# Patient Record
Sex: Female | Born: 1950
Health system: Southern US, Community
[De-identification: ages and names within clinical notes are randomized; demographics above are authoritative.]

## PROBLEM LIST (undated history)

## (undated) DIAGNOSIS — K589 Irritable bowel syndrome without diarrhea: Secondary | ICD-10-CM

## (undated) DIAGNOSIS — Q839 Congenital malformation of breast, unspecified: Secondary | ICD-10-CM

## (undated) DIAGNOSIS — I2510 Atherosclerotic heart disease of native coronary artery without angina pectoris: Secondary | ICD-10-CM

## (undated) DIAGNOSIS — N3946 Mixed incontinence: Secondary | ICD-10-CM

## (undated) DIAGNOSIS — Z9889 Other specified postprocedural states: Secondary | ICD-10-CM

## (undated) DIAGNOSIS — R519 Headache, unspecified: Secondary | ICD-10-CM

## (undated) DIAGNOSIS — J309 Allergic rhinitis, unspecified: Secondary | ICD-10-CM

## (undated) DIAGNOSIS — N200 Calculus of kidney: Secondary | ICD-10-CM

## (undated) DIAGNOSIS — Z6828 Body mass index (BMI) 28.0-28.9, adult: Secondary | ICD-10-CM

## (undated) DIAGNOSIS — J449 Chronic obstructive pulmonary disease, unspecified: Secondary | ICD-10-CM

## (undated) DIAGNOSIS — R159 Full incontinence of feces: Secondary | ICD-10-CM

## (undated) DIAGNOSIS — E7800 Pure hypercholesterolemia, unspecified: Secondary | ICD-10-CM

## (undated) DIAGNOSIS — I700 Atherosclerosis of aorta: Secondary | ICD-10-CM

## (undated) DIAGNOSIS — K219 Gastro-esophageal reflux disease without esophagitis: Secondary | ICD-10-CM

## (undated) DIAGNOSIS — E559 Vitamin D deficiency, unspecified: Secondary | ICD-10-CM

## (undated) DIAGNOSIS — N951 Menopausal and female climacteric states: Secondary | ICD-10-CM

## (undated) DIAGNOSIS — Z79899 Other long term (current) drug therapy: Secondary | ICD-10-CM

## (undated) DIAGNOSIS — G479 Sleep disorder, unspecified: Secondary | ICD-10-CM

## (undated) DIAGNOSIS — R9389 Abnormal findings on diagnostic imaging of other specified body structures: Secondary | ICD-10-CM

## (undated) DIAGNOSIS — F17200 Nicotine dependence, unspecified, uncomplicated: Secondary | ICD-10-CM

## (undated) DIAGNOSIS — N3010 Interstitial cystitis (chronic) without hematuria: Secondary | ICD-10-CM

## (undated) DIAGNOSIS — R51 Headache: Secondary | ICD-10-CM

## (undated) DIAGNOSIS — Z860100 Personal history of colon polyps, unspecified: Secondary | ICD-10-CM

## (undated) DIAGNOSIS — F419 Anxiety disorder, unspecified: Secondary | ICD-10-CM

## (undated) DIAGNOSIS — F4321 Adjustment disorder with depressed mood: Secondary | ICD-10-CM

## (undated) DIAGNOSIS — M810 Age-related osteoporosis without current pathological fracture: Secondary | ICD-10-CM

## (undated) DIAGNOSIS — F321 Major depressive disorder, single episode, moderate: Secondary | ICD-10-CM

## (undated) DIAGNOSIS — R140 Abdominal distension (gaseous): Secondary | ICD-10-CM

## (undated) DIAGNOSIS — R112 Nausea with vomiting, unspecified: Secondary | ICD-10-CM

## (undated) DIAGNOSIS — F4320 Adjustment disorder, unspecified: Secondary | ICD-10-CM

## (undated) HISTORY — DX: Chronic obstructive pulmonary disease, unspecified: J44.9

## (undated) HISTORY — DX: Adjustment disorder, unspecified: F43.20

## (undated) HISTORY — DX: Vitamin D deficiency, unspecified: E55.9

## (undated) HISTORY — DX: Menopausal and female climacteric states: N95.1

## (undated) HISTORY — DX: Personal history of colonic polyps: Z86.010

## (undated) HISTORY — DX: Abdominal distension (gaseous): R14.0

## (undated) HISTORY — PX: BREAST EXCISIONAL BIOPSY: SUR124

## (undated) HISTORY — PX: TUBAL LIGATION: SHX77

## (undated) HISTORY — DX: Allergic rhinitis, unspecified: J30.9

## (undated) HISTORY — DX: Nicotine dependence, unspecified, uncomplicated: F17.200

## (undated) HISTORY — DX: Major depressive disorder, single episode, moderate: F32.1

## (undated) HISTORY — DX: Body mass index (BMI) 28.0-28.9, adult: Z68.28

## (undated) HISTORY — DX: Pure hypercholesterolemia, unspecified: E78.00

## (undated) HISTORY — DX: Calculus of kidney: N20.0

## (undated) HISTORY — DX: Atherosclerosis of aorta: I70.0

## (undated) HISTORY — DX: Abnormal findings on diagnostic imaging of other specified body structures: R93.89

## (undated) HISTORY — DX: Age-related osteoporosis without current pathological fracture: M81.0

## (undated) HISTORY — PX: TONSILLECTOMY: SUR1361

## (undated) HISTORY — DX: Personal history of colon polyps, unspecified: Z86.0100

## (undated) HISTORY — DX: Sleep disorder, unspecified: G47.9

## (undated) HISTORY — PX: DILATION AND CURETTAGE OF UTERUS: SHX78

## (undated) HISTORY — PX: CHOLECYSTECTOMY: SHX55

## (undated) HISTORY — DX: Full incontinence of feces: R15.9

## (undated) HISTORY — DX: Other long term (current) drug therapy: Z79.899

## (undated) HISTORY — DX: Adjustment disorder with depressed mood: F43.21

## (undated) HISTORY — DX: Gastro-esophageal reflux disease without esophagitis: K21.9

## (undated) HISTORY — PX: EYE SURGERY: SHX253

## (undated) HISTORY — DX: Anxiety disorder, unspecified: F41.9

## (undated) HISTORY — DX: Mixed incontinence: N39.46

## (undated) HISTORY — DX: Interstitial cystitis (chronic) without hematuria: N30.10

## (undated) HISTORY — DX: Congenital malformation of breast, unspecified: Q83.9

## (undated) HISTORY — DX: Irritable bowel syndrome, unspecified: K58.9

## (undated) HISTORY — PX: BREAST CYST ASPIRATION: SHX578

---

## 2000-06-22 ENCOUNTER — Encounter: Admission: RE | Admit: 2000-06-22 | Discharge: 2000-06-22 | Payer: Self-pay | Admitting: Family Medicine

## 2000-06-22 ENCOUNTER — Encounter: Payer: Self-pay | Admitting: Family Medicine

## 2001-11-18 ENCOUNTER — Encounter: Admission: RE | Admit: 2001-11-18 | Discharge: 2001-11-18 | Payer: Self-pay | Admitting: Family Medicine

## 2001-11-18 ENCOUNTER — Encounter: Payer: Self-pay | Admitting: Family Medicine

## 2002-01-18 ENCOUNTER — Encounter: Payer: Self-pay | Admitting: Family Medicine

## 2002-01-18 ENCOUNTER — Ambulatory Visit (HOSPITAL_COMMUNITY): Admission: RE | Admit: 2002-01-18 | Discharge: 2002-01-18 | Payer: Self-pay | Admitting: Family Medicine

## 2002-01-18 ENCOUNTER — Emergency Department (HOSPITAL_COMMUNITY): Admission: EM | Admit: 2002-01-18 | Discharge: 2002-01-19 | Payer: Self-pay | Admitting: Emergency Medicine

## 2002-01-19 ENCOUNTER — Encounter: Payer: Self-pay | Admitting: Emergency Medicine

## 2002-01-21 ENCOUNTER — Encounter: Payer: Self-pay | Admitting: General Surgery

## 2002-01-21 ENCOUNTER — Encounter: Admission: RE | Admit: 2002-01-21 | Discharge: 2002-01-21 | Payer: Self-pay | Admitting: General Surgery

## 2002-01-24 ENCOUNTER — Encounter: Payer: Self-pay | Admitting: Family Medicine

## 2002-01-24 ENCOUNTER — Ambulatory Visit (HOSPITAL_COMMUNITY): Admission: RE | Admit: 2002-01-24 | Discharge: 2002-01-24 | Payer: Self-pay | Admitting: Family Medicine

## 2002-02-07 ENCOUNTER — Encounter (INDEPENDENT_AMBULATORY_CARE_PROVIDER_SITE_OTHER): Payer: Self-pay | Admitting: Specialist

## 2002-02-07 ENCOUNTER — Observation Stay (HOSPITAL_COMMUNITY): Admission: RE | Admit: 2002-02-07 | Discharge: 2002-02-08 | Payer: Self-pay | Admitting: *Deleted

## 2003-04-22 ENCOUNTER — Encounter: Admission: RE | Admit: 2003-04-22 | Discharge: 2003-04-22 | Payer: Self-pay | Admitting: Family Medicine

## 2003-04-22 ENCOUNTER — Encounter: Payer: Self-pay | Admitting: Family Medicine

## 2003-09-29 ENCOUNTER — Encounter: Admission: RE | Admit: 2003-09-29 | Discharge: 2003-09-29 | Payer: Self-pay | Admitting: Family Medicine

## 2004-02-19 ENCOUNTER — Emergency Department (HOSPITAL_COMMUNITY): Admission: EM | Admit: 2004-02-19 | Discharge: 2004-02-19 | Payer: Self-pay | Admitting: Emergency Medicine

## 2004-09-16 ENCOUNTER — Ambulatory Visit (HOSPITAL_COMMUNITY): Admission: RE | Admit: 2004-09-16 | Discharge: 2004-09-16 | Payer: Self-pay | Admitting: Gastroenterology

## 2004-09-16 ENCOUNTER — Encounter (INDEPENDENT_AMBULATORY_CARE_PROVIDER_SITE_OTHER): Payer: Self-pay | Admitting: *Deleted

## 2005-01-23 ENCOUNTER — Encounter: Admission: RE | Admit: 2005-01-23 | Discharge: 2005-01-23 | Payer: Self-pay | Admitting: Family Medicine

## 2005-05-16 ENCOUNTER — Encounter: Admission: RE | Admit: 2005-05-16 | Discharge: 2005-05-16 | Payer: Self-pay | Admitting: Family Medicine

## 2005-06-27 ENCOUNTER — Other Ambulatory Visit: Admission: RE | Admit: 2005-06-27 | Discharge: 2005-06-27 | Payer: Self-pay | Admitting: Obstetrics and Gynecology

## 2006-03-19 ENCOUNTER — Emergency Department (HOSPITAL_COMMUNITY): Admission: EM | Admit: 2006-03-19 | Discharge: 2006-03-19 | Payer: Self-pay | Admitting: Emergency Medicine

## 2006-04-20 ENCOUNTER — Encounter: Admission: RE | Admit: 2006-04-20 | Discharge: 2006-04-20 | Payer: Self-pay | Admitting: Family Medicine

## 2007-09-16 ENCOUNTER — Encounter: Admission: RE | Admit: 2007-09-16 | Discharge: 2007-09-16 | Payer: Self-pay | Admitting: Family Medicine

## 2008-03-11 ENCOUNTER — Other Ambulatory Visit: Admission: RE | Admit: 2008-03-11 | Discharge: 2008-03-11 | Payer: Self-pay | Admitting: Family Medicine

## 2008-04-23 ENCOUNTER — Encounter: Admission: RE | Admit: 2008-04-23 | Discharge: 2008-04-23 | Payer: Self-pay | Admitting: Family Medicine

## 2008-06-11 ENCOUNTER — Ambulatory Visit: Payer: Self-pay | Admitting: Oncology

## 2008-06-19 LAB — CBC WITH DIFFERENTIAL/PLATELET
BASO%: 0.9 % (ref 0.0–2.0)
Basophils Absolute: 0.1 10*3/uL (ref 0.0–0.1)
EOS%: 1.5 % (ref 0.0–7.0)
Eosinophils Absolute: 0.2 10*3/uL (ref 0.0–0.5)
HCT: 41.6 % (ref 34.8–46.6)
HGB: 14.2 g/dL (ref 11.6–15.9)
LYMPH%: 34.8 % (ref 14.0–48.0)
MCH: 29.8 pg (ref 26.0–34.0)
MCHC: 34.2 g/dL (ref 32.0–36.0)
MCV: 87 fL (ref 81.0–101.0)
MONO#: 0.4 10*3/uL (ref 0.1–0.9)
MONO%: 4 % (ref 0.0–13.0)
NEUT#: 6.1 10*3/uL (ref 1.5–6.5)
NEUT%: 58.8 % (ref 39.6–76.8)
Platelets: 465 10*3/uL — ABNORMAL HIGH (ref 145–400)
RBC: 4.78 10*6/uL (ref 3.70–5.32)
RDW: 12.5 % (ref 11.3–14.5)
WBC: 10.4 10*3/uL — ABNORMAL HIGH (ref 3.9–10.0)
lymph#: 3.6 10*3/uL — ABNORMAL HIGH (ref 0.9–3.3)

## 2008-06-19 LAB — CHCC SMEAR

## 2008-06-24 LAB — COMPREHENSIVE METABOLIC PANEL
ALT: 21 U/L (ref 0–35)
AST: 22 U/L (ref 0–37)
Albumin: 4.4 g/dL (ref 3.5–5.2)
Alkaline Phosphatase: 78 U/L (ref 39–117)
BUN: 17 mg/dL (ref 6–23)
CO2: 22 mEq/L (ref 19–32)
Calcium: 9.5 mg/dL (ref 8.4–10.5)
Chloride: 104 mEq/L (ref 96–112)
Creatinine, Ser: 0.82 mg/dL (ref 0.40–1.20)
Glucose, Bld: 91 mg/dL (ref 70–99)
Potassium: 4.1 mEq/L (ref 3.5–5.3)
Sodium: 137 mEq/L (ref 135–145)
Total Bilirubin: 0.5 mg/dL (ref 0.3–1.2)
Total Protein: 7.2 g/dL (ref 6.0–8.3)

## 2008-06-24 LAB — LACTATE DEHYDROGENASE: LDH: 138 U/L (ref 94–250)

## 2008-06-24 LAB — JAK2 GENOTYPR

## 2008-06-24 LAB — C-REACTIVE PROTEIN: CRP: 0.1 mg/dL (ref <0.6)

## 2008-08-17 ENCOUNTER — Ambulatory Visit: Payer: Self-pay | Admitting: Oncology

## 2009-12-10 ENCOUNTER — Encounter: Admission: RE | Admit: 2009-12-10 | Discharge: 2009-12-10 | Payer: Self-pay | Admitting: Gastroenterology

## 2010-10-28 ENCOUNTER — Encounter
Admission: RE | Admit: 2010-10-28 | Discharge: 2010-10-28 | Payer: Self-pay | Source: Home / Self Care | Attending: Family Medicine | Admitting: Family Medicine

## 2010-11-07 ENCOUNTER — Encounter: Payer: Self-pay | Admitting: Family Medicine

## 2011-03-03 NOTE — Op Note (Signed)
[name], Audrey Hall NO.:  [id number]   MEDICAL RECORD NO.:  [id number]          PATIENT TYPE:  AMB   LOCATION:  ENDO                         FACILITY:  [facility]   PHYSICIAN:  Petra Kuba, M.D.    DATE OF BIRTH:  17-Nov-1950   DATE OF PROCEDURE:  09/16/2004  DATE OF DISCHARGE:                                 OPERATIVE REPORT   PROCEDURE:  Colonoscopy.   INDICATIONS:  Screening. Consent was signed after risks, benefits, methods,  and options thoroughly discussed in the office.   MEDICINES USED:  Demerol 80, Versed 8.   PROCEDURE NOTE:  Rectal inspection pertinent for external hemorrhoids.  Digital exam was negative. Pediatric video adjustable colonoscope was  inserted with lots of difficulty due to a very tortuous colon with rolling  on her back and rolling on her right side and back on her back. With various  abdominal pressures, we were able to advance to the cecum. No abnormalities  were seen on insertion. The cecum was identified by the appendiceal orifice  and ileocecal valve. Proctoscope was inserted short ways into the terminal  ileum which was normal. Photo documentation was obtained. The scope was  slowly withdrawn. The prep was adequate. There was some liquid stool that  required washing and suctioning. The scope was slowly withdrawn. In the more  proximal ascending, 2 questionable polyps were seen and were cold biopsied  multiple times and put in the first container. In the more distant  transverse, a small to tiny polyp was seen and was hot biopsied x1 and put  in a second container. Scope was slowly withdrawn. When we fell back around  the tortuous loop, we did try to readvance around the curves to decrease  chances of missing things. In the transverse colon, probably mid, another  tiny polyp was seen and was hot biopsied x1 and put in a third container.  Scope was withdrawn around the left side of the colon. Some distal sigmoid  and rectal  hyperplastic appearing polyps were seen and were hot biopsied and  put in a fourth container. Anorectal pull through and retroflexion confirmed  small hemorrhoids. The scope was straightened and re advanced short ways up  the left side of the colon. Air was suctioned. Scope removed. The patient  tolerated the procedure adequately. There were no obvious immediate  complications.   ENDOSCOPIC DIAGNOSES:  1.  Internal and external hemorrhoids.  2.  Very tortuous colon.  3.  Tiny rectal and sigmoid hyperplastic appearing polyps, hot biopsied.  4.  Tiny transverse and distal ascending polyps hot biopsied.  5.  Two proximal ascending questionable polyps, cold biopsied.  6.  Otherwise within normal limits to the cecum.   PLAN:  Await pathology to determine further colonic screening. If the  proximal ascending bottle #1 are adenomatous, they were not completely  removed, although I doubt that from the appearance. Continue workup with an  EGD. Might consider a virtual colonoscopy in the future when widely  available and repeat screening is needed.      MEM/MEDQ  D:  09/16/2004  T:  09/17/2004  Job:  [id number]   cc:   C. Duane Lope, M.D.  [address]  [place]  [place] [address]  Fax: [phone]

## 2011-03-03 NOTE — Op Note (Signed)
Audrey Hall, Audrey NO.:  [id number]   MEDICAL RECORD NO.:  [id number]          PATIENT TYPE:  AMB   LOCATION:  ENDO                         FACILITY:  [facility]   PHYSICIAN:  Petra Kuba, M.D.    DATE OF BIRTH:  1951/07/07   DATE OF PROCEDURE:  09/16/2004  DATE OF DISCHARGE:                                 OPERATIVE REPORT   PROCEDURE:  Esophagogastroduodenoscopy with biopsy.   INDICATIONS:  Upper tract symptoms. Consent was signed after risks,  benefits, methods, and options thoroughly discussed multiple times in the  past.   MEDICINES USED:  Demerol 20, Versed 2.   PROCEDURE NOTE:  The video endoscope was inserted by direct vision. The  esophagus was normal. She did have a small hiatal hernia. Scope passed into  the stomach and advanced through the antrum where some mild antritis was  seen, advanced through a normal pylorus into the duodenal bulb where some  mild bulbitis was seen. Scope was advanced around the C loop to a normal  second portion and probably third part of the duodenum. She seemed to have  some mild duodenitis throughout and a few scattered duodenal biopsies were  obtained but not of the bulb. Scope was withdrawn back to the stomach and  retroflexed. The fundus and angularis were normal as were the proximal  lesser and greater curve. In the cardia, the hiatal hernia was confirmed.  Straight visualization of the stomach confirmed the antritis, ruled out any  additional abnormalities. Two biopsies of the proximal stomach to rule out  helicobacter pylori. Air was suctioned. Scope was slowly withdrawn again. A  good look at the esophagus was normal. Scope was removed. The patient  tolerated the procedure well. There was no obvious or immediate  complication.   ENDOSCOPIC DIAGNOSES:  1.  Small hiatal hernia.  2.  Mild antritis status post biopsy.  3.  Mild duodenitis status post biopsy.  4.  Otherwise normal esophagogastroduodenoscopy.   PLAN:  Await pathology. Pump inhibitors p.r.n. Followup p.r.n. We will need  to review her upper GI to make sure no further workup plans are needed.      MEM/Audrey Hall  D:  09/16/2004  T:  09/17/2004  Job:  [id number]

## 2011-03-03 NOTE — Op Note (Signed)
[facility]  Patient:    Audrey Hall, APUZZO Visit Number: [id number] MRN: [id number]          Service Type: SUR Location: 4W [id number] 02 Attending Physician:  Vikki Ports Dictated by:   Earna Coder, M.D. Proc. Date: 02/07/02 Admit Date:  02/07/2002                             Operative Report  PREOPERATIVE DIAGNOSIS:  Chronic cholecystitis.  POSTOPERATIVE DIAGNOSIS:  Chronic cholecystitis.  OPERATION PERFORMED:  Laparoscopic cholecystectomy.  SURGEON:  Stephenie Acres, M.D.  ANESTHESIA:  General.  DESCRIPTION OF PROCEDURE:  The patient was taken to the operating room and placed in supine position.  After adequate anesthesia was induced using endotracheal tube, the abdomen was prepped and draped in normal sterile fashion.  Using a transverse infraumbilical incision, I dissected down to the fascia.  The fascia was opened vertically and a 0 Vicryl pursestring suture was placed around the fascial defect.  The Hasson trocar was placed in the abdomen and the abdomen was insufflated with a continuous flow of carbon dioxide to a pressure of 15 mHg.  Under direct visualization, a 10 mm port was placed in the subxiphoid region and two 5 mm ports were placed in the right abdomen.  The gallbladder was identified and retracted cephalad.  There were a number of filmy adhesions to the infundibulum and these were taken down using both blunt and sharp dissection.  The infundibulum was further dissected until the cystic duct could easily be identified as could a window posterior to it. The cystic duct was triply clipped and divided.  The cystic artery which was also well visualized with a good window behind it and the liver, was triply clipped and divided.  The gallbladder was then taken off the gallbladder bed by incising the mesentery using Bovie electrocautery and it was delivered out through the umbilical port.  The right upper quadrant was  then irrigated. Adequate hemostasis was ensured.  The abdomen was allowed to deflate.  All incisions were injected using 0.5% Marcaine.  The infraumbilical fascial defect was closed with the 0 Vicryl pursestring suture.  Skin incisions were closed with subcuticular 4-0 Monocryl.  Steri-Strips and sterile dressings were applied.  The patient tolerated the procedure well and went to PACU in good condition. Dictated by:   Earna Coder, M.D. Attending Physician:  Danna Hefty R. DD:  02/08/02 TD:  02/08/02 Job: [id number]

## 2011-03-03 NOTE — Consult Note (Signed)
NAME:  Audrey Hall, Audrey Hall                          ACCOUNT NO.:  [id number]   MEDICAL RECORD NO.:  [id number]                   PATIENT TYPE:  EMS   LOCATION:  MAJO                                 FACILITY:  [facility]   PHYSICIAN:  Hollice Espy, M.D.            DATE OF BIRTH:  28-Apr-1951   DATE OF CONSULTATION:  02/19/2004  DATE OF DISCHARGE:                                   CONSULTATION   The patient is a 60 year old white female with past medical history of  hyperlipidemia  and cholecystectomy who presents with chest pain.  She told  me she has had episodes of intermittent chest pain at the left breast.  She  was working today at [employer] when she had an episode.  The other  nurses and physicians became concerned and advised patient to go to the  emergency room.  The patient came to the emergency room and was noted to be  in normal sinus rhythm.  Her vital signs were stable with a blood pressure  of 144/87, heart rate 69, respirations 18, temperature 98.   EKG showed normal sinus rhythm with a very mild left atrial enlargement.  This is compared to a previous EKG and found to be completely normal.  Her  cardiac enzymes were checked.  Initial cardiac enzymes were drawn and found  CPK 54, MB less than 1, troponin I less than 0.05.   I advised patient that she should stay for a further evaluation overnight  for two more sets of cardiac enzymes to be checked, but the patient stated  that her chest pain resolved when she was able to calm down in the emergency  room.  She tells me she feels completely back to normal.  She describes her  chest pain as mid sternal to the left breast and with no radiation.  She  denies any shortness of breath, diaphoresis, nausea, or vomiting.  She  denies any other complaints as well.  She tells me that she will not stay,  and she tells me that even if she did, she would not be able to get a stress  test until Monday, but she does assure me that she  will plan on getting a  stress test done next week.  Hopefully it will be as soon as Monday.   The patient managed to be discharged to home.  She is advised to return to  the emergency room if she had any type of chest pain.  For this, she is  advised to take it easy for the next few days until her stress test and  otherwise continue her medications.  She is advised and she tells me she  will indeed do so.  Her husband is present and expresses understanding and  compliance with this as well.  li  Hollice Espy, M.D.    SKK/MEDQ  D:  02/19/2004  T:  02/21/2004  Job:  [id number]   cc:   [facility]

## 2011-06-08 ENCOUNTER — Other Ambulatory Visit: Payer: Self-pay | Admitting: Family Medicine

## 2011-06-08 DIAGNOSIS — N63 Unspecified lump in unspecified breast: Secondary | ICD-10-CM

## 2011-06-09 ENCOUNTER — Ambulatory Visit
Admission: RE | Admit: 2011-06-09 | Discharge: 2011-06-09 | Disposition: A | Discharge status: Home / Self Care | Payer: Private Health Insurance - Indemnity | Source: Ambulatory Visit | Attending: Family Medicine | Admitting: Family Medicine

## 2011-06-09 DIAGNOSIS — N63 Unspecified lump in unspecified breast: Secondary | ICD-10-CM

## 2011-06-29 ENCOUNTER — Other Ambulatory Visit: Payer: Self-pay | Admitting: Family Medicine

## 2011-06-29 ENCOUNTER — Other Ambulatory Visit (HOSPITAL_COMMUNITY)
Admission: RE | Admit: 2011-06-29 | Discharge: 2011-06-29 | Disposition: A | Discharge status: Home / Self Care | Payer: Private Health Insurance - Indemnity | Source: Ambulatory Visit | Attending: Family Medicine | Admitting: Family Medicine

## 2011-06-29 DIAGNOSIS — Z124 Encounter for screening for malignant neoplasm of cervix: Secondary | ICD-10-CM | POA: Insufficient documentation

## 2011-11-29 ENCOUNTER — Other Ambulatory Visit: Payer: Self-pay | Admitting: Family Medicine

## 2011-11-29 DIAGNOSIS — Z1231 Encounter for screening mammogram for malignant neoplasm of breast: Secondary | ICD-10-CM

## 2011-11-30 ENCOUNTER — Other Ambulatory Visit: Payer: Self-pay | Admitting: Family Medicine

## 2011-11-30 DIAGNOSIS — Z803 Family history of malignant neoplasm of breast: Secondary | ICD-10-CM

## 2011-12-05 ENCOUNTER — Ambulatory Visit
Admission: RE | Admit: 2011-12-05 | Discharge: 2011-12-05 | Disposition: A | Discharge status: Home / Self Care | Payer: Private Health Insurance - Indemnity | Source: Ambulatory Visit | Attending: Family Medicine | Admitting: Family Medicine

## 2011-12-05 DIAGNOSIS — Z1231 Encounter for screening mammogram for malignant neoplasm of breast: Secondary | ICD-10-CM

## 2011-12-07 ENCOUNTER — Ambulatory Visit
Admission: RE | Admit: 2011-12-07 | Discharge: 2011-12-07 | Disposition: A | Discharge status: Home / Self Care | Payer: Private Health Insurance - Indemnity | Source: Ambulatory Visit | Attending: Family Medicine | Admitting: Family Medicine

## 2011-12-07 DIAGNOSIS — Z803 Family history of malignant neoplasm of breast: Secondary | ICD-10-CM

## 2011-12-07 MED ORDER — GADOBENATE DIMEGLUMINE 529 MG/ML IV SOLN
10.0000 mL | Freq: Once | INTRAVENOUS | Status: AC | PRN
  Administered 2011-12-07: 10 mL via INTRAVENOUS

## 2013-01-23 ENCOUNTER — Other Ambulatory Visit: Payer: Self-pay

## 2013-01-23 DIAGNOSIS — Z1231 Encounter for screening mammogram for malignant neoplasm of breast: Secondary | ICD-10-CM

## 2013-02-17 ENCOUNTER — Ambulatory Visit
Admission: RE | Admit: 2013-02-17 | Discharge: 2013-02-17 | Disposition: A | Discharge status: Home / Self Care | Payer: Private Health Insurance - Indemnity | Source: Ambulatory Visit

## 2013-02-17 DIAGNOSIS — Z1231 Encounter for screening mammogram for malignant neoplasm of breast: Secondary | ICD-10-CM

## 2014-02-27 ENCOUNTER — Emergency Department (HOSPITAL_COMMUNITY)
Admission: EM | Admit: 2014-02-27 | Discharge: 2014-02-27 | Disposition: A | Discharge status: Home / Self Care | Payer: Federal, State, Local not specified - PPO | Attending: Emergency Medicine | Admitting: Emergency Medicine

## 2014-02-27 ENCOUNTER — Emergency Department (HOSPITAL_COMMUNITY): Payer: Federal, State, Local not specified - PPO

## 2014-02-27 ENCOUNTER — Encounter (HOSPITAL_COMMUNITY): Payer: Self-pay | Admitting: Emergency Medicine

## 2014-02-27 DIAGNOSIS — R030 Elevated blood-pressure reading, without diagnosis of hypertension: Secondary | ICD-10-CM | POA: Insufficient documentation

## 2014-02-27 DIAGNOSIS — F430 Acute stress reaction: Secondary | ICD-10-CM | POA: Insufficient documentation

## 2014-02-27 DIAGNOSIS — F411 Generalized anxiety disorder: Secondary | ICD-10-CM | POA: Insufficient documentation

## 2014-02-27 DIAGNOSIS — M549 Dorsalgia, unspecified: Secondary | ICD-10-CM | POA: Insufficient documentation

## 2014-02-27 DIAGNOSIS — F17200 Nicotine dependence, unspecified, uncomplicated: Secondary | ICD-10-CM | POA: Insufficient documentation

## 2014-02-27 DIAGNOSIS — F419 Anxiety disorder, unspecified: Secondary | ICD-10-CM

## 2014-02-27 DIAGNOSIS — R209 Unspecified disturbances of skin sensation: Secondary | ICD-10-CM | POA: Insufficient documentation

## 2014-02-27 DIAGNOSIS — E7800 Pure hypercholesterolemia, unspecified: Secondary | ICD-10-CM | POA: Insufficient documentation

## 2014-02-27 HISTORY — DX: Headache, unspecified: R51.9

## 2014-02-27 HISTORY — DX: Pure hypercholesterolemia, unspecified: E78.00

## 2014-02-27 HISTORY — DX: Headache: R51

## 2014-02-27 LAB — I-STAT CHEM 8, ED
BUN: 10 mg/dL (ref 6–23)
Calcium, Ion: 1.28 mmol/L (ref 1.13–1.30)
Chloride: 105 mEq/L (ref 96–112)
Creatinine, Ser: 0.8 mg/dL (ref 0.50–1.10)
GLUCOSE: 99 mg/dL (ref 70–99)
HEMATOCRIT: 43 % (ref 36.0–46.0)
HEMOGLOBIN: 14.6 g/dL (ref 12.0–15.0)
POTASSIUM: 3.8 meq/L (ref 3.7–5.3)
SODIUM: 144 meq/L (ref 137–147)
TCO2: 26 mmol/L (ref 0–100)

## 2014-02-27 MED ORDER — LORAZEPAM 1 MG PO TABS
1.0000 mg | ORAL_TABLET | Freq: Once | ORAL | Status: AC
  Administered 2014-02-27: 1 mg via ORAL
  Filled 2014-02-27: qty 1

## 2014-02-27 NOTE — ED Notes (Addendum)
Pt. Have a bilateral headache for 3 days with generalized weakness and mid upper back pain.  No neuro deficits.  Pt. Is alert and oriented X3. CBG 118 , Pain 5/10 120/80 HR 86,. Resp. 16 Pt. Denies any chest pain or numbness or tingling Denies any n/v, Pt. Reports having occipital headaches

## 2014-02-27 NOTE — Discharge Instructions (Signed)
Stress Stress-related medical problems are becoming increasingly common. The body has a built-in physical response to stressful situations. Faced with pressure, challenge or danger, we need to react quickly. Our bodies release hormones such as cortisol and adrenaline to help do this. These hormones are part of the "fight or flight" response and affect the metabolic rate, heart rate and blood pressure, resulting in a heightened, stressed state that prepares the body for optimum performance in dealing with a stressful situation. It is likely that early man required these mechanisms to stay alive, but usually modern stresses do not call for this, and the same hormones released in today's world can damage health and reduce coping ability. CAUSES  Pressure to perform at work, at school or in sports.  Threats of physical violence.  Money worries.  Arguments.  Family conflicts.  Divorce or separation from significant other.  Bereavement.  New job or unemployment.  Changes in location.  Alcohol or drug abuse. SOMETIMES, THERE IS NO PARTICULAR REASON FOR DEVELOPING STRESS. Almost all people are at risk of being stressed at some time in their lives. It is important to know that some stress is temporary and some is long term.  Temporary stress will go away when a situation is resolved. Most people can cope with short periods of stress, and it can often be relieved by relaxing, taking a walk, chatting through issues with friends, or having a good night's sleep.  Chronic (long-term, continuous) stress is much harder to deal with. It can be psychologically and emotionally damaging. It can be harmful both for an individual and for friends and family. SYMPTOMS Everyone reacts to stress differently. There are some common effects that help [place] recognize it. In times of extreme stress, people may:  Shake uncontrollably.  Breathe faster and deeper than normal (hyperventilate).  Vomit.  For people  with asthma, stress can trigger an attack.  For some people, stress may trigger migraine headaches, ulcers, and body pain. PHYSICAL EFFECTS OF STRESS MAY INCLUDE:  Loss of energy.  Skin problems.  Aches and pains resulting from tense muscles, including neck ache, backache and tension headaches.  Increased pain from arthritis and other conditions.  Irregular heart beat (palpitations).  Periods of irritability or anger.  Apathy or depression.  Anxiety (feeling uptight or worrying).  Unusual behavior.  Loss of appetite.  Comfort eating.  Lack of concentration.  Loss of, or decreased, sex-drive.  Increased smoking, drinking, or recreational drug use.  For women, missed periods.  Ulcers, joint pain, and muscle pain. Post-traumatic stress is the stress caused by any serious accident, strong emotional damage, or extremely difficult or violent experience such as rape or war. Post-traumatic stress victims can experience mixtures of emotions such as fear, shame, depression, guilt or anger. It may include recurrent memories or images that may be haunting. These feelings can last for weeks, months or even years after the traumatic event that triggered them. Specialized treatment, possibly with medicines and psychological therapies, is available. If stress is causing physical symptoms, severe distress or making it difficult for you to function as normal, it is worth seeing your caregiver. It is important to remember that although stress is a usual part of life, extreme or prolonged stress can lead to other illnesses that will need treatment. It is better to visit a doctor sooner rather than later. Stress has been linked to the development of high blood pressure and heart disease, as well as insomnia and depression. There is no diagnostic test for  stress since everyone reacts to it differently. But a caregiver will be able to spot the physical symptoms, such  as:  Headaches.  Shingles.  Ulcers. Emotional distress such as intense worry, low mood or irritability should be detected when the doctor asks pertinent questions to identify any underlying problems that might be the cause. In case there are physical reasons for the symptoms, the doctor may also want to do some tests to exclude certain conditions. If you feel that you are suffering from stress, try to identify the aspects of your life that are causing it. Sometimes you may not be able to change or avoid them, but even a small change can have a positive ripple effect. A simple lifestyle change can make all the difference. STRATEGIES THAT CAN HELP DEAL WITH STRESS:  Delegating or sharing responsibilities.  Avoiding confrontations.  Learning to be more assertive.  Regular exercise.  Avoid using alcohol or street drugs to cope.  Eating a healthy, balanced diet, rich in fruit and vegetables and proteins.  Finding humor or absurdity in stressful situations.  Never taking on more than you know you can handle comfortably.  Organizing your time better to get as much done as possible.  Talking to friends or family and sharing your thoughts and fears.  Listening to music or relaxation tapes.  Tensing and then relaxing your muscles, starting at the toes and working up to the head and neck. If you think that you would benefit from help, either in identifying the things that are causing your stress or in learning techniques to help you relax, see a caregiver who is capable of helping you with this. Rather than relying on medications, it is usually better to try and identify the things in your life that are causing stress and try to deal with them. There are many techniques of managing stress including counseling, psychotherapy, aromatherapy, yoga, and exercise. Your caregiver can help you determine what is best for you. Document Released: 12/23/2002 Document Revised: 12/25/2011 Document  Reviewed: 11/19/2007 [facility] LLCExitCare Patient Information 2014 [place], [place]. Generalized Anxiety Disorder Generalized anxiety disorder (GAD) is a mental disorder. It interferes with life functions, including relationships, work, and school. GAD is different from normal anxiety, which everyone experiences at some point in their lives in response to specific life events and activities. Normal anxiety actually helps us prepare for and get through these life events and activities. Normal anxiety goes away after the event or activity is over.  GAD causes anxiety that is not necessarily related to specific events or activities. It also causes excess anxiety in proportion to specific events or activities. The anxiety associated with GAD is also difficult to control. GAD can vary from mild to severe. People with severe GAD can have intense waves of anxiety with physical symptoms (panic attacks).  SYMPTOMS The anxiety and worry associated with GAD are difficult to control. This anxiety and worry are related to many life events and activities and also occur more days than not for 6 months or longer. People with GAD also have three or more of the following symptoms (one or more in children):  Restlessness.   Fatigue.  Difficulty concentrating.   Irritability.  Muscle tension.  Difficulty sleeping or unsatisfying sleep. DIAGNOSIS GAD is diagnosed through an assessment by your caregiver. Your caregiver will ask you questions aboutyour mood,physical symptoms, and events in your life. Your caregiver may ask you about your medical history and use of alcohol or drugs, including prescription medications. Your caregiver may  also do a physical exam and blood tests. Certain medical conditions and the use of certain substances can cause symptoms similar to those associated with GAD. Your caregiver may refer you to a mental health specialist for further evaluation. TREATMENT The following therapies are usually used  to treat GAD:   Medication Antidepressant medication usually is prescribed for long-term daily control. Antianxiety medications may be added in severe cases, especially when panic attacks occur.   Talk therapy (psychotherapy) Certain types of talk therapy can be helpful in treating GAD by providing support, education, and guidance. A form of talk therapy called cognitive behavioral therapy can teach you healthy ways to think about and react to daily life events and activities.  Stress managementtechniques These include yoga, meditation, and exercise and can be very helpful when they are practiced regularly. A mental health specialist can help determine which treatment is best for you. Some people see improvement with one therapy. However, other people require a combination of therapies. Document Released: 01/27/2013 Document Reviewed: 01/27/2013 [facility] LLCExitCare Patient Information 2014 [place]ExitCare, [place]LL[place].

## 2014-02-27 NOTE — ED Provider Notes (Addendum)
CSN: [id number]     Arrival date & time 02/27/14  1234 History   First MD Initiated Contact with Patient 02/27/14 1259     Chief Complaint  Patient presents with  . Headache     (Consider location/radiation/quality/duration/timing/severity/associated sxs/prior Treatment) HPI  63 year old female who comes in today complaining of some headache, back pain, anxiety, emotional stress. She states that she has been having increased stressors at home and at work. She describes the headache as mild and intermittent. It has essentially resolved now. She had some pain in her upper back without any definite trauma. She then was feeling like she had tingling all over. She was at work and asked a Radio broadcast assistantcoworker to check her out. Her blood pressure was noted to be elevated and they called EMS and subsequently she was brought here. The back pain and headaches have essentially resolved spontaneously. She did not have any vision changes or lateralized weakness.  Past Medical History  Diagnosis Date  . Headache   . Hypercholesteremia    Past Surgical History  Procedure Laterality Date  . Cholecystectomy     No family history on file. History  Substance Use Topics  . Smoking status: Current Every Day Smoker    Types: Cigarettes  . Smokeless tobacco: Not on file  . Alcohol Use: No   OB History   Grav Para Term Preterm Abortions TAB SAB Ect Mult Living                 Review of Systems  All other systems reviewed and are negative.     Allergies  Review of patient's allergies indicates no known allergies.  Home Medications   Prior to Admission medications   Not on File   BP 117/70  Pulse 76  Temp(Src) 98.2 F (36.8 C) (Oral)  Resp 16  SpO2 99% Physical Exam  Nursing note and vitals reviewed. Constitutional: She is oriented to person, place, and time. She appears well-developed and well-nourished.  HENT:  Head: Normocephalic and atraumatic.  Right Ear: External ear normal.  Left Ear:  External ear normal.  Nose: Nose normal.  Mouth/Throat: Oropharynx is clear and moist.  Eyes: Conjunctivae and EOM are normal. Pupils are equal, round, and reactive to light.  Neck: Normal range of motion. Neck supple. No JVD present. No tracheal deviation present. No thyromegaly present.  Cardiovascular: Normal rate, regular rhythm, normal heart sounds and intact distal pulses.   Pulmonary/Chest: Effort normal and breath sounds normal. No respiratory distress. She has no wheezes.  Abdominal: Soft. Bowel sounds are normal. She exhibits no mass. There is no tenderness. There is no guarding.  Musculoskeletal: Normal range of motion.  Lymphadenopathy:    She has no cervical adenopathy.  Neurological: She is alert and oriented to person, place, and time. She has normal reflexes. No cranial nerve deficit or sensory deficit. Gait normal. GCS eye subscore is 4. GCS verbal subscore is 5. GCS motor subscore is 6.  Reflex Scores:      Bicep reflexes are 2+ on the right side and 2+ on the left side.      Patellar reflexes are 2+ on the right side and 2+ on the left side. Strength is 5/5 bilateral elbow flexor/extensors, wrist extension/flexion, intrinsic hand strength equal Bilateral hip flexion/extension 5/5, knee flexion/extension 5/5, ankle 5/5 flexion extension    Skin: Skin is warm and dry.  Psychiatric: Her speech is normal and behavior is normal. Judgment and thought content normal. Her mood appears anxious. She  expresses no suicidal plans and no homicidal plans.  Patient is somewhat tearful    ED Course  Procedures (including critical care time) Labs Review Labs Reviewed  I-STAT CHEM 8, ED    Imaging Review Dg Chest 2 View  02/27/2014   CLINICAL DATA:  Chest tightness  EXAM: CHEST  2 VIEW  COMPARISON:  January 07, 2014  FINDINGS: The heart size and mediastinal contours are within normal limits. There is no focal infiltrate, pulmonary edema, or pleural effusion. The visualized skeletal  structures are unremarkable. There is scoliosis of spine.  IMPRESSION: No active cardiopulmonary disease.   Electronically Signed   By: Sherian ReinWei-Chen  Lin M.D.   On: 02/27/2014 13:49     EKG Interpretation   Date/Time:  Friday Feb 27 2014 13:54:54 EDT Ventricular Rate:  71 PR Interval:  167 QRS Duration: 77 QT Interval:  390 QTC Calculation: 424 R Axis:   70 Text Interpretation:  Normal sinus rhythm Normal ECG Confirmed by Yoshiaki Kreuser MD,  Duwayne HeckANIELLE ([id number]) on 02/27/2014 2:23:28 PM      MDM   Final diagnoses:  Anxiety   63 year old female who comes in today complaining of diffuse paresthesias and anxiety. Electrolytes and EKG are normal. She had some back pain and hence the EKG was checked. She feels that this is likely mostly secondary to anxiety and historically this appears to be worn out. She does not appear in any acute metabolic abnormalities and EKG is normal. She is given Ativan here and feels improved. I've advised her of return precautions and need for close followup and she was understanding.      Hilario Quarryanielle S Minetta Krisher, MD 02/27/14 1427  Hilario Quarryanielle S Mallie Linnemann, MD 02/27/14 [phone]

## 2014-04-02 ENCOUNTER — Other Ambulatory Visit: Payer: Self-pay | Admitting: Family Medicine

## 2014-04-02 ENCOUNTER — Other Ambulatory Visit (HOSPITAL_COMMUNITY)
Admission: RE | Admit: 2014-04-02 | Discharge: 2014-04-02 | Disposition: A | Discharge status: Home / Self Care | Payer: Federal, State, Local not specified - PPO | Source: Ambulatory Visit | Attending: Family Medicine | Admitting: Family Medicine

## 2014-04-02 DIAGNOSIS — Z124 Encounter for screening for malignant neoplasm of cervix: Secondary | ICD-10-CM | POA: Insufficient documentation

## 2014-04-06 LAB — CYTOLOGY - PAP

## 2014-07-08 ENCOUNTER — Other Ambulatory Visit: Payer: Self-pay | Admitting: Family Medicine

## 2014-07-08 ENCOUNTER — Other Ambulatory Visit: Payer: Self-pay

## 2014-07-08 DIAGNOSIS — Z1231 Encounter for screening mammogram for malignant neoplasm of breast: Secondary | ICD-10-CM

## 2014-07-27 ENCOUNTER — Ambulatory Visit: Payer: Private Health Insurance - Indemnity

## 2014-07-27 ENCOUNTER — Ambulatory Visit
Admission: RE | Admit: 2014-07-27 | Discharge: 2014-07-27 | Disposition: A | Discharge status: Home / Self Care | Payer: Federal, State, Local not specified - PPO | Source: Ambulatory Visit

## 2014-07-27 DIAGNOSIS — Z1231 Encounter for screening mammogram for malignant neoplasm of breast: Secondary | ICD-10-CM

## 2015-07-09 ENCOUNTER — Other Ambulatory Visit: Payer: Self-pay

## 2015-07-09 DIAGNOSIS — Z1231 Encounter for screening mammogram for malignant neoplasm of breast: Secondary | ICD-10-CM

## 2015-08-06 ENCOUNTER — Ambulatory Visit
Admission: RE | Admit: 2015-08-06 | Discharge: 2015-08-06 | Disposition: A | Discharge status: Home / Self Care | Payer: Federal, State, Local not specified - PPO | Source: Ambulatory Visit

## 2015-08-06 DIAGNOSIS — Z1231 Encounter for screening mammogram for malignant neoplasm of breast: Secondary | ICD-10-CM

## 2015-08-27 ENCOUNTER — Other Ambulatory Visit: Payer: Self-pay | Admitting: Gastroenterology

## 2016-04-05 DIAGNOSIS — F411 Generalized anxiety disorder: Secondary | ICD-10-CM | POA: Diagnosis not present

## 2016-04-05 DIAGNOSIS — J449 Chronic obstructive pulmonary disease, unspecified: Secondary | ICD-10-CM | POA: Diagnosis not present

## 2016-04-05 DIAGNOSIS — R42 Dizziness and giddiness: Secondary | ICD-10-CM | POA: Diagnosis not present

## 2016-04-05 DIAGNOSIS — N951 Menopausal and female climacteric states: Secondary | ICD-10-CM | POA: Diagnosis not present

## 2016-07-12 ENCOUNTER — Other Ambulatory Visit: Payer: Self-pay | Admitting: Family Medicine

## 2016-07-12 DIAGNOSIS — Z1231 Encounter for screening mammogram for malignant neoplasm of breast: Secondary | ICD-10-CM

## 2016-08-09 ENCOUNTER — Ambulatory Visit
Admission: RE | Admit: 2016-08-09 | Discharge: 2016-08-09 | Disposition: A | Discharge status: Home / Self Care | Payer: Federal, State, Local not specified - PPO | Source: Ambulatory Visit | Attending: Family Medicine | Admitting: Family Medicine

## 2016-08-09 DIAGNOSIS — Z1231 Encounter for screening mammogram for malignant neoplasm of breast: Secondary | ICD-10-CM | POA: Diagnosis not present

## 2016-09-13 DIAGNOSIS — H43811 Vitreous degeneration, right eye: Secondary | ICD-10-CM | POA: Diagnosis not present

## 2016-10-11 DIAGNOSIS — H43811 Vitreous degeneration, right eye: Secondary | ICD-10-CM | POA: Diagnosis not present

## 2016-10-18 DIAGNOSIS — H43311 Vitreous membranes and strands, right eye: Secondary | ICD-10-CM | POA: Diagnosis not present

## 2016-10-18 DIAGNOSIS — H43811 Vitreous degeneration, right eye: Secondary | ICD-10-CM | POA: Diagnosis not present

## 2016-10-25 DIAGNOSIS — H43811 Vitreous degeneration, right eye: Secondary | ICD-10-CM | POA: Diagnosis not present

## 2016-10-25 DIAGNOSIS — H43311 Vitreous membranes and strands, right eye: Secondary | ICD-10-CM | POA: Diagnosis not present

## 2016-11-08 DIAGNOSIS — F411 Generalized anxiety disorder: Secondary | ICD-10-CM | POA: Diagnosis not present

## 2016-11-08 DIAGNOSIS — L309 Dermatitis, unspecified: Secondary | ICD-10-CM | POA: Diagnosis not present

## 2016-11-08 DIAGNOSIS — R079 Chest pain, unspecified: Secondary | ICD-10-CM | POA: Diagnosis not present

## 2016-11-08 DIAGNOSIS — Z0000 Encounter for general adult medical examination without abnormal findings: Secondary | ICD-10-CM | POA: Diagnosis not present

## 2016-11-08 DIAGNOSIS — E7800 Pure hypercholesterolemia, unspecified: Secondary | ICD-10-CM | POA: Diagnosis not present

## 2016-12-06 DIAGNOSIS — H43811 Vitreous degeneration, right eye: Secondary | ICD-10-CM | POA: Diagnosis not present

## 2016-12-06 DIAGNOSIS — H2513 Age-related nuclear cataract, bilateral: Secondary | ICD-10-CM | POA: Diagnosis not present

## 2016-12-06 DIAGNOSIS — H43311 Vitreous membranes and strands, right eye: Secondary | ICD-10-CM | POA: Diagnosis not present

## 2017-04-11 DIAGNOSIS — F411 Generalized anxiety disorder: Secondary | ICD-10-CM | POA: Diagnosis not present

## 2017-04-11 DIAGNOSIS — J449 Chronic obstructive pulmonary disease, unspecified: Secondary | ICD-10-CM | POA: Diagnosis not present

## 2017-06-06 DIAGNOSIS — H2513 Age-related nuclear cataract, bilateral: Secondary | ICD-10-CM | POA: Diagnosis not present

## 2017-06-06 DIAGNOSIS — H43311 Vitreous membranes and strands, right eye: Secondary | ICD-10-CM | POA: Diagnosis not present

## 2017-06-06 DIAGNOSIS — H43811 Vitreous degeneration, right eye: Secondary | ICD-10-CM | POA: Diagnosis not present

## 2017-06-08 DIAGNOSIS — H6982 Other specified disorders of Eustachian tube, left ear: Secondary | ICD-10-CM | POA: Diagnosis not present

## 2017-06-08 DIAGNOSIS — H6092 Unspecified otitis externa, left ear: Secondary | ICD-10-CM | POA: Diagnosis not present

## 2017-07-25 DIAGNOSIS — D225 Melanocytic nevi of trunk: Secondary | ICD-10-CM | POA: Diagnosis not present

## 2017-07-25 DIAGNOSIS — L821 Other seborrheic keratosis: Secondary | ICD-10-CM | POA: Diagnosis not present

## 2017-08-01 DIAGNOSIS — R42 Dizziness and giddiness: Secondary | ICD-10-CM | POA: Diagnosis not present

## 2017-08-02 ENCOUNTER — Other Ambulatory Visit: Payer: Self-pay | Admitting: Family Medicine

## 2017-08-02 DIAGNOSIS — Z1231 Encounter for screening mammogram for malignant neoplasm of breast: Secondary | ICD-10-CM

## 2017-08-22 DIAGNOSIS — Z23 Encounter for immunization: Secondary | ICD-10-CM | POA: Diagnosis not present

## 2017-08-22 DIAGNOSIS — Q839 Congenital malformation of breast, unspecified: Secondary | ICD-10-CM | POA: Diagnosis not present

## 2017-08-27 ENCOUNTER — Other Ambulatory Visit: Payer: Self-pay | Admitting: Obstetrics and Gynecology

## 2017-08-27 ENCOUNTER — Other Ambulatory Visit: Payer: Self-pay | Admitting: Family Medicine

## 2017-08-27 DIAGNOSIS — Q839 Congenital malformation of breast, unspecified: Secondary | ICD-10-CM

## 2017-09-05 ENCOUNTER — Ambulatory Visit
Admission: RE | Admit: 2017-09-05 | Discharge: 2017-09-05 | Disposition: A | Discharge status: Home / Self Care | Payer: Federal, State, Local not specified - PPO | Source: Ambulatory Visit | Attending: Family Medicine | Admitting: Family Medicine

## 2017-09-05 DIAGNOSIS — N6321 Unspecified lump in the left breast, upper outer quadrant: Secondary | ICD-10-CM | POA: Diagnosis not present

## 2017-09-05 DIAGNOSIS — Q839 Congenital malformation of breast, unspecified: Secondary | ICD-10-CM

## 2017-09-05 DIAGNOSIS — R922 Inconclusive mammogram: Secondary | ICD-10-CM | POA: Diagnosis not present

## 2017-09-12 DIAGNOSIS — J449 Chronic obstructive pulmonary disease, unspecified: Secondary | ICD-10-CM | POA: Diagnosis not present

## 2017-09-12 DIAGNOSIS — H8110 Benign paroxysmal vertigo, unspecified ear: Secondary | ICD-10-CM

## 2017-09-12 DIAGNOSIS — H903 Sensorineural hearing loss, bilateral: Secondary | ICD-10-CM

## 2017-09-12 DIAGNOSIS — R42 Dizziness and giddiness: Secondary | ICD-10-CM

## 2017-09-12 DIAGNOSIS — H9313 Tinnitus, bilateral: Secondary | ICD-10-CM

## 2017-09-12 DIAGNOSIS — Z0110 Encounter for examination of ears and hearing without abnormal findings: Secondary | ICD-10-CM | POA: Diagnosis not present

## 2017-09-12 HISTORY — DX: Dizziness and giddiness: R42

## 2017-09-12 HISTORY — DX: Sensorineural hearing loss, bilateral: H90.3

## 2017-09-12 HISTORY — DX: Benign paroxysmal vertigo, unspecified ear: H81.10

## 2017-09-12 HISTORY — DX: Tinnitus, bilateral: H93.13

## 2017-11-14 ENCOUNTER — Other Ambulatory Visit (HOSPITAL_COMMUNITY)
Admission: RE | Admit: 2017-11-14 | Discharge: 2017-11-14 | Disposition: A | Discharge status: Home / Self Care | Payer: Federal, State, Local not specified - PPO | Source: Ambulatory Visit | Attending: Family Medicine | Admitting: Family Medicine

## 2017-11-14 ENCOUNTER — Other Ambulatory Visit: Payer: Self-pay | Admitting: Family Medicine

## 2017-11-14 DIAGNOSIS — J449 Chronic obstructive pulmonary disease, unspecified: Secondary | ICD-10-CM | POA: Diagnosis not present

## 2017-11-14 DIAGNOSIS — N951 Menopausal and female climacteric states: Secondary | ICD-10-CM | POA: Diagnosis not present

## 2017-11-14 DIAGNOSIS — Z124 Encounter for screening for malignant neoplasm of cervix: Secondary | ICD-10-CM | POA: Diagnosis not present

## 2017-11-14 DIAGNOSIS — F411 Generalized anxiety disorder: Secondary | ICD-10-CM | POA: Diagnosis not present

## 2017-11-14 DIAGNOSIS — Z0000 Encounter for general adult medical examination without abnormal findings: Secondary | ICD-10-CM | POA: Diagnosis not present

## 2017-11-14 DIAGNOSIS — E559 Vitamin D deficiency, unspecified: Secondary | ICD-10-CM | POA: Diagnosis not present

## 2017-11-14 DIAGNOSIS — K219 Gastro-esophageal reflux disease without esophagitis: Secondary | ICD-10-CM | POA: Diagnosis not present

## 2017-11-14 DIAGNOSIS — E7800 Pure hypercholesterolemia, unspecified: Secondary | ICD-10-CM | POA: Diagnosis not present

## 2017-11-15 LAB — CYTOLOGY - PAP: Diagnosis: NEGATIVE

## 2018-05-08 DIAGNOSIS — F411 Generalized anxiety disorder: Secondary | ICD-10-CM | POA: Diagnosis not present

## 2018-05-08 DIAGNOSIS — G479 Sleep disorder, unspecified: Secondary | ICD-10-CM | POA: Diagnosis not present

## 2018-07-31 DIAGNOSIS — Z23 Encounter for immunization: Secondary | ICD-10-CM | POA: Diagnosis not present

## 2018-10-26 DIAGNOSIS — K120 Recurrent oral aphthae: Secondary | ICD-10-CM | POA: Diagnosis not present

## 2018-10-30 ENCOUNTER — Other Ambulatory Visit: Payer: Self-pay | Admitting: Gastroenterology

## 2018-10-30 ENCOUNTER — Ambulatory Visit
Admission: RE | Admit: 2018-10-30 | Discharge: 2018-10-30 | Disposition: A | Discharge status: Home / Self Care | Payer: Federal, State, Local not specified - PPO | Source: Ambulatory Visit | Attending: Gastroenterology | Admitting: Gastroenterology

## 2018-10-30 DIAGNOSIS — K589 Irritable bowel syndrome without diarrhea: Secondary | ICD-10-CM | POA: Diagnosis not present

## 2018-10-30 DIAGNOSIS — R159 Full incontinence of feces: Secondary | ICD-10-CM

## 2018-10-30 DIAGNOSIS — R140 Abdominal distension (gaseous): Secondary | ICD-10-CM | POA: Diagnosis not present

## 2018-12-04 DIAGNOSIS — H43812 Vitreous degeneration, left eye: Secondary | ICD-10-CM | POA: Diagnosis not present

## 2018-12-11 DIAGNOSIS — K589 Irritable bowel syndrome without diarrhea: Secondary | ICD-10-CM | POA: Diagnosis not present

## 2018-12-18 DIAGNOSIS — Z79899 Other long term (current) drug therapy: Secondary | ICD-10-CM | POA: Diagnosis not present

## 2018-12-18 DIAGNOSIS — E7800 Pure hypercholesterolemia, unspecified: Secondary | ICD-10-CM | POA: Diagnosis not present

## 2018-12-18 DIAGNOSIS — E559 Vitamin D deficiency, unspecified: Secondary | ICD-10-CM | POA: Diagnosis not present

## 2018-12-25 DIAGNOSIS — K219 Gastro-esophageal reflux disease without esophagitis: Secondary | ICD-10-CM | POA: Diagnosis not present

## 2018-12-25 DIAGNOSIS — Z0000 Encounter for general adult medical examination without abnormal findings: Secondary | ICD-10-CM | POA: Diagnosis not present

## 2018-12-25 DIAGNOSIS — Z23 Encounter for immunization: Secondary | ICD-10-CM | POA: Diagnosis not present

## 2018-12-25 DIAGNOSIS — F411 Generalized anxiety disorder: Secondary | ICD-10-CM | POA: Diagnosis not present

## 2018-12-25 DIAGNOSIS — M810 Age-related osteoporosis without current pathological fracture: Secondary | ICD-10-CM | POA: Diagnosis not present

## 2018-12-25 DIAGNOSIS — J449 Chronic obstructive pulmonary disease, unspecified: Secondary | ICD-10-CM | POA: Diagnosis not present

## 2019-01-01 DIAGNOSIS — H43392 Other vitreous opacities, left eye: Secondary | ICD-10-CM | POA: Diagnosis not present

## 2019-01-01 DIAGNOSIS — H35372 Puckering of macula, left eye: Secondary | ICD-10-CM | POA: Diagnosis not present

## 2019-01-01 DIAGNOSIS — H43812 Vitreous degeneration, left eye: Secondary | ICD-10-CM | POA: Diagnosis not present

## 2019-01-01 DIAGNOSIS — H4312 Vitreous hemorrhage, left eye: Secondary | ICD-10-CM | POA: Diagnosis not present

## 2019-01-01 DIAGNOSIS — H318 Other specified disorders of choroid: Secondary | ICD-10-CM | POA: Diagnosis not present

## 2019-04-29 ENCOUNTER — Other Ambulatory Visit: Payer: Self-pay | Admitting: Family Medicine

## 2019-04-29 DIAGNOSIS — Z1231 Encounter for screening mammogram for malignant neoplasm of breast: Secondary | ICD-10-CM

## 2019-04-30 ENCOUNTER — Other Ambulatory Visit: Payer: Self-pay | Admitting: Family Medicine

## 2019-04-30 DIAGNOSIS — N644 Mastodynia: Secondary | ICD-10-CM

## 2019-05-07 ENCOUNTER — Ambulatory Visit
Admission: RE | Admit: 2019-05-07 | Discharge: 2019-05-07 | Disposition: A | Discharge status: Home / Self Care | Payer: Federal, State, Local not specified - PPO | Source: Ambulatory Visit | Attending: Family Medicine | Admitting: Family Medicine

## 2019-05-07 ENCOUNTER — Other Ambulatory Visit: Payer: Self-pay

## 2019-05-07 ENCOUNTER — Ambulatory Visit: Payer: Federal, State, Local not specified - PPO

## 2019-05-07 DIAGNOSIS — R928 Other abnormal and inconclusive findings on diagnostic imaging of breast: Secondary | ICD-10-CM | POA: Diagnosis not present

## 2019-05-07 DIAGNOSIS — N644 Mastodynia: Secondary | ICD-10-CM

## 2019-07-02 DIAGNOSIS — J449 Chronic obstructive pulmonary disease, unspecified: Secondary | ICD-10-CM | POA: Diagnosis not present

## 2019-07-02 DIAGNOSIS — F411 Generalized anxiety disorder: Secondary | ICD-10-CM | POA: Diagnosis not present

## 2019-11-09 ENCOUNTER — Ambulatory Visit: Payer: Federal, State, Local not specified - PPO | Attending: Internal Medicine

## 2019-11-09 DIAGNOSIS — Z23 Encounter for immunization: Secondary | ICD-10-CM | POA: Insufficient documentation

## 2019-11-09 NOTE — Progress Notes (Signed)
   Covid-19 Vaccination Clinic  Name:  Audrey Hall    MRN: [id number] DOB: 1950-11-07  11/09/2019  Ms. [name] was observed post Covid-19 immunization for 15 minutes without incidence. She was provided with Vaccine Information Sheet and instruction to access the V-Safe system.   Ms. Walls was instructed to call 911 with any severe reactions post vaccine: Marland Kitchen Difficulty breathing  . Swelling of your face and throat  . A fast heartbeat  . A bad rash all over your body  . Dizziness and weakness    Immunizations Administered    Name Date Dose VIS Date Route   Pfizer COVID-19 Vaccine 11/09/2019 12:47 PM 0.3 mL 09/26/2019 Intramuscular   Manufacturer: [employer], [employer]   Lot: [id number]   NDC: [address]-8

## 2019-12-01 ENCOUNTER — Ambulatory Visit: Payer: Federal, State, Local not specified - PPO | Attending: Internal Medicine

## 2019-12-01 DIAGNOSIS — Z23 Encounter for immunization: Secondary | ICD-10-CM | POA: Insufficient documentation

## 2019-12-01 NOTE — Progress Notes (Signed)
   Covid-19 Vaccination Clinic  Name:  Audrey Hall    MRN: [id number] DOB: November 17, 1950  12/01/2019  [name] was observed post Covid-19 immunization for 15 minutes without incidence. She was provided with Vaccine Information Sheet and instruction to access the V-Safe system.   [name] was instructed to call 911 with any severe reactions post vaccine: Marland Kitchen Difficulty breathing  . Swelling of your face and throat  . A fast heartbeat  . A bad rash all over your body  . Dizziness and weakness    Immunizations Administered    Name Date Dose VIS Date Route   Pfizer COVID-19 Vaccine 12/01/2019 11:40 AM 0.3 mL 09/26/2019 Intramuscular   Manufacturer: [employer], [employer]   Lot: [id number]   NDC: [address]-1

## 2019-12-10 DIAGNOSIS — H43813 Vitreous degeneration, bilateral: Secondary | ICD-10-CM | POA: Diagnosis not present

## 2019-12-10 DIAGNOSIS — H04123 Dry eye syndrome of bilateral lacrimal glands: Secondary | ICD-10-CM | POA: Diagnosis not present

## 2019-12-10 DIAGNOSIS — H17823 Peripheral opacity of cornea, bilateral: Secondary | ICD-10-CM | POA: Diagnosis not present

## 2019-12-10 DIAGNOSIS — H2513 Age-related nuclear cataract, bilateral: Secondary | ICD-10-CM | POA: Diagnosis not present

## 2020-01-12 DIAGNOSIS — H2512 Age-related nuclear cataract, left eye: Secondary | ICD-10-CM | POA: Diagnosis not present

## 2020-01-21 DIAGNOSIS — Z131 Encounter for screening for diabetes mellitus: Secondary | ICD-10-CM | POA: Diagnosis not present

## 2020-01-21 DIAGNOSIS — E7800 Pure hypercholesterolemia, unspecified: Secondary | ICD-10-CM | POA: Diagnosis not present

## 2020-01-21 DIAGNOSIS — Z0000 Encounter for general adult medical examination without abnormal findings: Secondary | ICD-10-CM | POA: Diagnosis not present

## 2020-01-21 DIAGNOSIS — E559 Vitamin D deficiency, unspecified: Secondary | ICD-10-CM | POA: Diagnosis not present

## 2020-01-21 DIAGNOSIS — R208 Other disturbances of skin sensation: Secondary | ICD-10-CM | POA: Diagnosis not present

## 2020-01-21 DIAGNOSIS — J449 Chronic obstructive pulmonary disease, unspecified: Secondary | ICD-10-CM | POA: Diagnosis not present

## 2020-01-27 DIAGNOSIS — H2512 Age-related nuclear cataract, left eye: Secondary | ICD-10-CM | POA: Diagnosis not present

## 2020-01-27 DIAGNOSIS — H25812 Combined forms of age-related cataract, left eye: Secondary | ICD-10-CM | POA: Diagnosis not present

## 2020-02-04 DIAGNOSIS — H2511 Age-related nuclear cataract, right eye: Secondary | ICD-10-CM | POA: Diagnosis not present

## 2020-02-17 DIAGNOSIS — H2511 Age-related nuclear cataract, right eye: Secondary | ICD-10-CM | POA: Diagnosis not present

## 2020-02-17 DIAGNOSIS — H25811 Combined forms of age-related cataract, right eye: Secondary | ICD-10-CM | POA: Diagnosis not present

## 2020-04-08 DIAGNOSIS — M791 Myalgia, unspecified site: Secondary | ICD-10-CM | POA: Diagnosis not present

## 2020-05-24 ENCOUNTER — Emergency Department (HOSPITAL_BASED_OUTPATIENT_CLINIC_OR_DEPARTMENT_OTHER): Payer: Federal, State, Local not specified - PPO

## 2020-05-24 ENCOUNTER — Other Ambulatory Visit: Payer: Self-pay

## 2020-05-24 ENCOUNTER — Encounter (HOSPITAL_BASED_OUTPATIENT_CLINIC_OR_DEPARTMENT_OTHER): Payer: Self-pay | Admitting: *Deleted

## 2020-05-24 ENCOUNTER — Emergency Department (HOSPITAL_BASED_OUTPATIENT_CLINIC_OR_DEPARTMENT_OTHER)
Admission: EM | Admit: 2020-05-24 | Discharge: 2020-05-24 | Disposition: A | Discharge status: Home / Self Care | Payer: Federal, State, Local not specified - PPO | Attending: Emergency Medicine | Admitting: Emergency Medicine

## 2020-05-24 DIAGNOSIS — Z7982 Long term (current) use of aspirin: Secondary | ICD-10-CM | POA: Diagnosis not present

## 2020-05-24 DIAGNOSIS — R1013 Epigastric pain: Secondary | ICD-10-CM | POA: Diagnosis not present

## 2020-05-24 DIAGNOSIS — R1111 Vomiting without nausea: Secondary | ICD-10-CM | POA: Diagnosis not present

## 2020-05-24 DIAGNOSIS — R52 Pain, unspecified: Secondary | ICD-10-CM | POA: Diagnosis not present

## 2020-05-24 DIAGNOSIS — K5730 Diverticulosis of large intestine without perforation or abscess without bleeding: Secondary | ICD-10-CM | POA: Diagnosis not present

## 2020-05-24 DIAGNOSIS — K838 Other specified diseases of biliary tract: Secondary | ICD-10-CM | POA: Diagnosis not present

## 2020-05-24 DIAGNOSIS — F17210 Nicotine dependence, cigarettes, uncomplicated: Secondary | ICD-10-CM | POA: Diagnosis not present

## 2020-05-24 DIAGNOSIS — R112 Nausea with vomiting, unspecified: Secondary | ICD-10-CM | POA: Insufficient documentation

## 2020-05-24 DIAGNOSIS — R109 Unspecified abdominal pain: Secondary | ICD-10-CM | POA: Diagnosis not present

## 2020-05-24 DIAGNOSIS — I700 Atherosclerosis of aorta: Secondary | ICD-10-CM | POA: Diagnosis not present

## 2020-05-24 DIAGNOSIS — R110 Nausea: Secondary | ICD-10-CM | POA: Diagnosis not present

## 2020-05-24 DIAGNOSIS — K6389 Other specified diseases of intestine: Secondary | ICD-10-CM | POA: Diagnosis not present

## 2020-05-24 LAB — URINALYSIS, MICROSCOPIC (REFLEX)

## 2020-05-24 LAB — CBC WITH DIFFERENTIAL/PLATELET
Abs Immature Granulocytes: 0.05 10*3/uL (ref 0.00–0.07)
Basophils Absolute: 0.2 10*3/uL — ABNORMAL HIGH (ref 0.0–0.1)
Basophils Relative: 1 %
Eosinophils Absolute: 0.5 10*3/uL (ref 0.0–0.5)
Eosinophils Relative: 3 %
HCT: 44.5 % (ref 36.0–46.0)
Hemoglobin: 14.9 g/dL (ref 12.0–15.0)
Immature Granulocytes: 0 %
Lymphocytes Relative: 25 %
Lymphs Abs: 3.5 10*3/uL (ref 0.7–4.0)
MCH: 30.9 pg (ref 26.0–34.0)
MCHC: 33.5 g/dL (ref 30.0–36.0)
MCV: 92.3 fL (ref 80.0–100.0)
Monocytes Absolute: 0.9 10*3/uL (ref 0.1–1.0)
Monocytes Relative: 6 %
Neutro Abs: 9.4 10*3/uL — ABNORMAL HIGH (ref 1.7–7.7)
Neutrophils Relative %: 65 %
Platelets: 395 10*3/uL (ref 150–400)
RBC: 4.82 MIL/uL (ref 3.87–5.11)
RDW: 13.7 % (ref 11.5–15.5)
WBC: 14.4 10*3/uL — ABNORMAL HIGH (ref 4.0–10.5)
nRBC: 0 % (ref 0.0–0.2)

## 2020-05-24 LAB — COMPREHENSIVE METABOLIC PANEL
ALT: 22 U/L (ref 0–44)
AST: 27 U/L (ref 15–41)
Albumin: 4.4 g/dL (ref 3.5–5.0)
Alkaline Phosphatase: 59 U/L (ref 38–126)
Anion gap: 13 (ref 5–15)
BUN: 23 mg/dL (ref 8–23)
CO2: 21 mmol/L — ABNORMAL LOW (ref 22–32)
Calcium: 9.5 mg/dL (ref 8.9–10.3)
Chloride: 105 mmol/L (ref 98–111)
Creatinine, Ser: 0.82 mg/dL (ref 0.44–1.00)
GFR calc Af Amer: >60 mL/min (ref >60)
GFR calc non Af Amer: >60 mL/min (ref >60)
Glucose, Bld: 140 mg/dL — ABNORMAL HIGH (ref 70–99)
Potassium: 4 mmol/L (ref 3.5–5.1)
Sodium: 139 mmol/L (ref 135–145)
Total Bilirubin: 0.3 mg/dL (ref 0.3–1.2)
Total Protein: 7.2 g/dL (ref 6.5–8.1)

## 2020-05-24 LAB — URINALYSIS, ROUTINE W REFLEX MICROSCOPIC
Bilirubin Urine: NEGATIVE
Glucose, UA: NEGATIVE mg/dL
Ketones, ur: NEGATIVE mg/dL
Nitrite: NEGATIVE
Protein, ur: NEGATIVE mg/dL
Specific Gravity, Urine: >1.03 — ABNORMAL HIGH (ref 1.005–1.030)
pH: 5.5 (ref 5.0–8.0)

## 2020-05-24 LAB — LIPASE, BLOOD: Lipase: 43 U/L (ref 11–51)

## 2020-05-24 MED ORDER — PROMETHAZINE HCL 25 MG/ML IJ SOLN
12.5000 mg | Freq: Once | INTRAMUSCULAR | Status: AC
  Administered 2020-05-24: 12.5 mg via INTRAVENOUS
  Filled 2020-05-24: qty 1

## 2020-05-24 MED ORDER — ONDANSETRON HCL 4 MG/2ML IJ SOLN: 4.0000 mg | Freq: Once | INTRAMUSCULAR | Status: AC

## 2020-05-24 MED ORDER — DROPERIDOL 2.5 MG/ML IJ SOLN
1.2500 mg | Freq: Once | INTRAMUSCULAR | Status: AC
  Administered 2020-05-24: 1.25 mg via INTRAVENOUS
  Filled 2020-05-24: qty 2

## 2020-05-24 MED ORDER — SODIUM CHLORIDE 0.9 % IV BOLUS
1000.0000 mL | Freq: Once | INTRAVENOUS | Status: AC
  Administered 2020-05-24: 1000 mL via INTRAVENOUS

## 2020-05-24 MED ORDER — ONDANSETRON 4 MG PO TBDP: 4.0000 mg | ORAL_TABLET | Freq: Three times a day (TID) | ORAL | 0 refills | Status: DC | PRN

## 2020-05-24 MED ORDER — MORPHINE SULFATE (PF) 4 MG/ML IV SOLN
4.0000 mg | Freq: Once | INTRAVENOUS | Status: AC
  Administered 2020-05-24: 4 mg via INTRAVENOUS
  Filled 2020-05-24: qty 1

## 2020-05-24 MED ORDER — IOHEXOL 300 MG/ML  SOLN
100.0000 mL | Freq: Once | INTRAMUSCULAR | Status: AC | PRN
  Administered 2020-05-24: 100 mL via INTRAVENOUS

## 2020-05-24 MED ORDER — ONDANSETRON HCL 4 MG/2ML IJ SOLN
INTRAMUSCULAR | Status: AC
  Administered 2020-05-24: 4 mg
  Filled 2020-05-24: qty 2

## 2020-05-24 MED ORDER — PANTOPRAZOLE SODIUM 40 MG IV SOLR
40.0000 mg | Freq: Once | INTRAVENOUS | Status: AC
  Administered 2020-05-24: 40 mg via INTRAVENOUS
  Filled 2020-05-24: qty 40

## 2020-05-24 NOTE — Discharge Instructions (Addendum)
You were seen in the emergency department for upper abdominal pain nausea and vomiting. You had blood work EKG and a CAT scan of your abdomen and pelvis. Your CAT scan showed some dilation of your biliary system which can be seen after having her gallbladder removed. This also may be the source of your symptoms. Will need follow-up with your GI doctor. We are prescribing you some nausea medication to take as needed. Please return to the emergency department if you have any worsening or concerning symptoms.

## 2020-05-24 NOTE — ED Provider Notes (Signed)
Signout from Dr. Wilkie Aye.  69 year old female here with upper abdominal pain associated with nausea and vomiting.  Dry heaving.  No diarrhea.  Labs showing white count elevation, normal LFTs, urinalysis with 21-50 reds 6-10 whites.  CT did not show an obvious kidney stone.  Radiology does comment upon some dilation of intrahepatic and extrahepatic but no obvious mass.  Plan is to follow-up on symptom control, possible admission Physical Exam  BP 95/62   Pulse 77   Temp 97.9 F (36.6 C) (Axillary)   Resp 15   Ht 5\' 1"  (1.549 m)   Wt 58.1 kg   SpO2 92%   BMI 24.19 kg/m   Physical Exam  ED Course/Procedures     Procedures  MDM  Patient states her symptoms are improving.  She had some sleep after the droperidol.  I reviewed the findings with [facility] .  She said that the dilatation may be the cause of the patient's symptoms.  If she needs admission she her recommendation would be an MRI and MRCP with and without.  If she ends up getting admitted please formally contact their service.  Reassessed patient and she is now tolerating p.o. and asking to be discharged. She says her pain is resolved. She has a GI doctor through [facility]. Recommended she contact them for close follow-up regarding her CT findings. Return instructions discussed.       Natrona heights, MD 05/24/20 2037

## 2020-05-24 NOTE — ED Notes (Signed)
Pt discharged to home NAD.  

## 2020-05-24 NOTE — Telephone Encounter (Signed)
Called office for GI consult, error

## 2020-05-24 NOTE — ED Provider Notes (Signed)
[facility] EMERGENCY DEPARTMENT Provider Note   CSN: [id number] Arrival date & time: 05/24/20  0549     History Chief Complaint  Patient presents with  . Abdominal Pain    Audrey Hall is a 69 y.o. female.  HPI     This is a 69 year old female with a history of headache and hyperlipidemia who presents with abdominal pain, nausea, vomiting.  Acute in onset.  Started around 4 AM.  Patient is very difficult to obtain history from as she is actively retching and very uncomfortable appearing.  She states that the pain is in the middle of her abdomen and radiates around to the back.  No fevers.  No recent illnesses.  Denies any urinary symptoms.  No diarrhea.  Husband states that she felt well before going to bed last night.  She has reportedly received 2 doses of Zofran prior to my evaluation and is continuing to retch.  Level 5 caveat for acuity of condition.  6:50 AM Patient still uncomfortable and retching.  She is able to some more history.  Rates her pain 8 out of 10.  She has never had pain like this before.  Past Medical History:  Diagnosis Date  . Headache   . Hypercholesteremia     There are no problems to display for this patient.   Past Surgical History:  Procedure Laterality Date  . BREAST CYST ASPIRATION    . BREAST EXCISIONAL BIOPSY Right   . CHOLECYSTECTOMY       OB History   No obstetric history on file.     Family History  Problem Relation Age of Onset  . Breast cancer Mother   . Breast cancer Sister   . Breast cancer Maternal Aunt     Social History   Tobacco Use  . Smoking status: Current Every Day Smoker    Types: Cigarettes  . Smokeless tobacco: Never Used  Substance Use Topics  . Alcohol use: No  . Drug use: No    Home Medications Prior to Admission medications   Medication Sig Start Date End Date Taking? Authorizing Provider  ALPRAZolam Prudy Feeler) 0.5 MG tablet Take 0.5 mg by mouth at bedtime as needed for anxiety.   Yes  [provider]  aspirin EC 81 MG tablet Take 81 mg by mouth daily.    [provider]  atorvastatin (LIPITOR) 10 MG tablet Take 10 mg by mouth daily.    [provider]  Cholecalciferol (VITAMIN D3) 5000 UNITS CAPS Take 1 capsule by mouth daily.    [provider]  esomeprazole (NEXIUM) 40 MG capsule Take 40 mg by mouth daily at 12 noon.    [provider]  estrogens, conjugated, (PREMARIN) 1.25 MG tablet Take 1.25 mg by mouth daily.    [provider]  gemfibrozil (LOPID) 600 MG tablet Take 600 mg by mouth 2 (two) times daily before a meal.    [provider]  medroxyPROGESTERone (PROVERA) 2.5 MG tablet Take 2.5 mg by mouth daily.    [provider]  temazepam (RESTORIL) 15 MG capsule Take 15 mg by mouth at bedtime as needed for sleep.    [provider]  Vitamin D, Ergocalciferol, (DRISDOL) 50000 UNITS CAPS capsule Take 50,000 Units by mouth every 7 (seven) days.    [provider]    Allergies    Patient has no known allergies.  Review of Systems   Review of Systems  Unable to perform ROS: Acuity of condition  Constitutional: Negative for fever.  Gastrointestinal: Positive for abdominal pain, nausea and vomiting.    Physical Exam Updated Vital Signs BP 128/80 (BP Location: Right Arm)   Pulse 88   Temp 97.9 F (36.6 C) (Axillary)   Resp 20   Ht 1.549 m (5\' 1" )   Wt 58.1 kg   SpO2 96%   BMI 24.19 kg/m   Physical Exam Vitals and nursing note reviewed.  Constitutional:      Comments: Actively retching and uncomfortable appearing  HENT:     Head: Normocephalic and atraumatic.     Mouth/Throat:     Comments: Dry mucous membranes Eyes:     Pupils: Pupils are equal, round, and reactive to light.  Cardiovascular:     Rate and Rhythm: Normal rate and regular rhythm.  Pulmonary:     Effort: Pulmonary effort is normal. No respiratory distress.  Abdominal:     Palpations: Abdomen is  soft.  Musculoskeletal:     Cervical back: Neck supple.  Skin:    General: Skin is warm and dry.  Neurological:     Mental Status: She is alert and oriented to person, place, and time.  Psychiatric:        Mood and Affect: Mood normal.     ED Results / Procedures / Treatments   Labs (all labs ordered are listed, but only abnormal results are displayed) Labs Reviewed  CBC WITH DIFFERENTIAL/PLATELET - Abnormal; Notable for the following components:      Result Value   WBC 14.4 (*)    Neutro Abs 9.4 (*)    Basophils Absolute 0.2 (*)    All other components within normal limits  COMPREHENSIVE METABOLIC PANEL  URINALYSIS, ROUTINE W REFLEX MICROSCOPIC  LIPASE, BLOOD    EKG None  Radiology No results found.  Procedures Procedures (including critical care time)  Medications Ordered in ED Medications  promethazine (PHENERGAN) injection 12.5 mg (has no administration in time range)  sodium chloride 0.9 % bolus 1,000 mL (has no administration in time range)  ondansetron (ZOFRAN) injection 4 mg (4 mg Intravenous Given 05/24/20 07/24/20)    ED Course  I have reviewed the triage vital signs and the nursing notes.  Pertinent labs & imaging results that were available during my care of the patient were reviewed by me and considered in my medical decision making (see chart for details).    MDM Rules/Calculators/A&P                           Patient presents with acute onset abdominal pain, nausea, vomiting.  Very uncomfortable and difficult to obtain history from.  Vital signs reviewed and reassuring.  She is afebrile.  No peritonitis on exam.  Labs obtained.  She has a leukocytosis.  LFTs and lipase are reassuring.  Urinalysis without obvious infection.  CT scan of the abdomen obtained.  Considerations include but not limited to, pancreatitis, kidney stone.  Less likely cholecystitis given history of cholecystectomy.  CT scans reviewed with patient and Dr. [id number].  She has a dilated  biliary tree.  This is of unknown clinical significance.  On recheck, she is feeling somewhat better.  We will symptomatically control.  Patient signed out to Dr. Charm Barges for further management.  May require admission.  Final Clinical Impression(s) / ED Diagnoses Final diagnoses:  Epigastric pain  Non-intractable vomiting with nausea, unspecified vomiting type  Dilation of biliary tract    Rx / DC Orders ED  Discharge Orders    None       Shon Baton, MD 05/25/20 [phone]

## 2020-05-24 NOTE — ED Notes (Signed)
Pt given coffee. States feeling much better

## 2020-05-24 NOTE — ED Triage Notes (Addendum)
C/o general abd pain that started at 3am. States pain is constant and sharp. Pt c/o vomiting twice. Denies any diarrhea. Denies any urinary symptoms. Pt is vomiting on arrival by [facility]. zofran 4mg  IV given on arrival.

## 2020-05-24 NOTE — ED Notes (Signed)
ED Provider at bedside. 

## 2020-07-21 DIAGNOSIS — R932 Abnormal findings on diagnostic imaging of liver and biliary tract: Secondary | ICD-10-CM | POA: Diagnosis not present

## 2020-07-28 DIAGNOSIS — E7800 Pure hypercholesterolemia, unspecified: Secondary | ICD-10-CM | POA: Diagnosis not present

## 2020-08-04 DIAGNOSIS — Z23 Encounter for immunization: Secondary | ICD-10-CM | POA: Diagnosis not present

## 2020-08-04 DIAGNOSIS — F411 Generalized anxiety disorder: Secondary | ICD-10-CM | POA: Diagnosis not present

## 2020-08-04 DIAGNOSIS — E7800 Pure hypercholesterolemia, unspecified: Secondary | ICD-10-CM | POA: Diagnosis not present

## 2020-08-04 DIAGNOSIS — G479 Sleep disorder, unspecified: Secondary | ICD-10-CM | POA: Diagnosis not present

## 2020-09-17 DIAGNOSIS — Z1159 Encounter for screening for other viral diseases: Secondary | ICD-10-CM | POA: Diagnosis not present

## 2020-09-22 DIAGNOSIS — Z86010 Personal history of colonic polyps: Secondary | ICD-10-CM | POA: Diagnosis not present

## 2020-09-22 DIAGNOSIS — K635 Polyp of colon: Secondary | ICD-10-CM | POA: Diagnosis not present

## 2020-09-22 DIAGNOSIS — D123 Benign neoplasm of transverse colon: Secondary | ICD-10-CM | POA: Diagnosis not present

## 2020-09-22 DIAGNOSIS — K5730 Diverticulosis of large intestine without perforation or abscess without bleeding: Secondary | ICD-10-CM | POA: Diagnosis not present

## 2020-09-25 ENCOUNTER — Emergency Department (HOSPITAL_COMMUNITY): Payer: Federal, State, Local not specified - PPO | Admitting: Anesthesiology

## 2020-09-25 ENCOUNTER — Encounter (HOSPITAL_COMMUNITY)
Admission: EM | Disposition: A | Discharge status: Home / Self Care | Payer: Self-pay | Source: Home / Self Care | Attending: Emergency Medicine

## 2020-09-25 ENCOUNTER — Emergency Department (HOSPITAL_BASED_OUTPATIENT_CLINIC_OR_DEPARTMENT_OTHER): Payer: Federal, State, Local not specified - PPO

## 2020-09-25 ENCOUNTER — Encounter (HOSPITAL_BASED_OUTPATIENT_CLINIC_OR_DEPARTMENT_OTHER): Payer: Self-pay

## 2020-09-25 ENCOUNTER — Ambulatory Visit (HOSPITAL_BASED_OUTPATIENT_CLINIC_OR_DEPARTMENT_OTHER)
Admission: EM | Admit: 2020-09-25 | Discharge: 2020-09-25 | Disposition: A | Discharge status: Home / Self Care | Payer: Federal, State, Local not specified - PPO | Attending: Emergency Medicine | Admitting: Emergency Medicine

## 2020-09-25 ENCOUNTER — Other Ambulatory Visit: Payer: Self-pay

## 2020-09-25 DIAGNOSIS — R1084 Generalized abdominal pain: Secondary | ICD-10-CM | POA: Diagnosis not present

## 2020-09-25 DIAGNOSIS — Z7982 Long term (current) use of aspirin: Secondary | ICD-10-CM | POA: Insufficient documentation

## 2020-09-25 DIAGNOSIS — Z803 Family history of malignant neoplasm of breast: Secondary | ICD-10-CM | POA: Insufficient documentation

## 2020-09-25 DIAGNOSIS — R1110 Vomiting, unspecified: Secondary | ICD-10-CM | POA: Diagnosis not present

## 2020-09-25 DIAGNOSIS — K3580 Unspecified acute appendicitis: Secondary | ICD-10-CM | POA: Insufficient documentation

## 2020-09-25 DIAGNOSIS — E7800 Pure hypercholesterolemia, unspecified: Secondary | ICD-10-CM | POA: Diagnosis not present

## 2020-09-25 DIAGNOSIS — R109 Unspecified abdominal pain: Secondary | ICD-10-CM | POA: Diagnosis not present

## 2020-09-25 DIAGNOSIS — Z79899 Other long term (current) drug therapy: Secondary | ICD-10-CM | POA: Diagnosis not present

## 2020-09-25 DIAGNOSIS — F17210 Nicotine dependence, cigarettes, uncomplicated: Secondary | ICD-10-CM | POA: Insufficient documentation

## 2020-09-25 DIAGNOSIS — R112 Nausea with vomiting, unspecified: Secondary | ICD-10-CM | POA: Diagnosis not present

## 2020-09-25 DIAGNOSIS — Z20822 Contact with and (suspected) exposure to covid-19: Secondary | ICD-10-CM | POA: Diagnosis not present

## 2020-09-25 HISTORY — PX: LAPAROSCOPIC APPENDECTOMY: SHX408

## 2020-09-25 LAB — CBC WITH DIFFERENTIAL/PLATELET
Abs Immature Granulocytes: 0.03 K/uL (ref 0.00–0.07)
Basophils Absolute: 0.1 K/uL (ref 0.0–0.1)
Basophils Relative: 1 %
Eosinophils Absolute: 0.2 K/uL (ref 0.0–0.5)
Eosinophils Relative: 1 %
HCT: 43.3 % (ref 36.0–46.0)
Hemoglobin: 14.7 g/dL (ref 12.0–15.0)
Immature Granulocytes: 0 %
Lymphocytes Relative: 13 %
Lymphs Abs: 1.7 K/uL (ref 0.7–4.0)
MCH: 30.8 pg (ref 26.0–34.0)
MCHC: 33.9 g/dL (ref 30.0–36.0)
MCV: 90.6 fL (ref 80.0–100.0)
Monocytes Absolute: 0.5 K/uL (ref 0.1–1.0)
Monocytes Relative: 4 %
Neutro Abs: 11 K/uL — ABNORMAL HIGH (ref 1.7–7.7)
Neutrophils Relative %: 81 %
Platelets: 407 K/uL — ABNORMAL HIGH (ref 150–400)
RBC: 4.78 MIL/uL (ref 3.87–5.11)
RDW: 14.4 % (ref 11.5–15.5)
WBC: 13.5 K/uL — ABNORMAL HIGH (ref 4.0–10.5)
nRBC: 0 % (ref 0.0–0.2)

## 2020-09-25 LAB — COMPREHENSIVE METABOLIC PANEL WITH GFR
ALT: 22 U/L (ref 0–44)
AST: 28 U/L (ref 15–41)
Albumin: 4.4 g/dL (ref 3.5–5.0)
Alkaline Phosphatase: 63 U/L (ref 38–126)
Anion gap: 10 (ref 5–15)
BUN: 22 mg/dL (ref 8–23)
CO2: 22 mmol/L (ref 22–32)
Calcium: 9.5 mg/dL (ref 8.9–10.3)
Chloride: 103 mmol/L (ref 98–111)
Creatinine, Ser: 0.66 mg/dL (ref 0.44–1.00)
GFR, Estimated: >60 mL/min
Glucose, Bld: 132 mg/dL — ABNORMAL HIGH (ref 70–99)
Potassium: 3.7 mmol/L (ref 3.5–5.1)
Sodium: 135 mmol/L (ref 135–145)
Total Bilirubin: 0.7 mg/dL (ref 0.3–1.2)
Total Protein: 7.3 g/dL (ref 6.5–8.1)

## 2020-09-25 LAB — URINALYSIS, ROUTINE W REFLEX MICROSCOPIC
Bilirubin Urine: NEGATIVE
Glucose, UA: NEGATIVE mg/dL
Ketones, ur: NEGATIVE mg/dL
Leukocytes,Ua: NEGATIVE
Nitrite: NEGATIVE
Protein, ur: NEGATIVE mg/dL
Specific Gravity, Urine: 1.025 (ref 1.005–1.030)
pH: 7 (ref 5.0–8.0)

## 2020-09-25 LAB — URINALYSIS, MICROSCOPIC (REFLEX)

## 2020-09-25 LAB — RESP PANEL BY RT-PCR (FLU A&B, COVID) ARPGX2
Influenza A by PCR: NEGATIVE
Influenza B by PCR: NEGATIVE
SARS Coronavirus 2 by RT PCR: NEGATIVE

## 2020-09-25 LAB — LIPASE, BLOOD: Lipase: 31 U/L (ref 11–51)

## 2020-09-25 SURGERY — APPENDECTOMY, LAPAROSCOPIC
Anesthesia: General | Site: Abdomen

## 2020-09-25 MED ORDER — OXYCODONE HCL 5 MG PO TABS: 5.0000 mg | ORAL_TABLET | Freq: Four times a day (QID) | ORAL | 0 refills | Status: DC | PRN

## 2020-09-25 MED ORDER — SUCCINYLCHOLINE CHLORIDE 200 MG/10ML IV SOSY
PREFILLED_SYRINGE | INTRAVENOUS | Status: DC | PRN
  Administered 2020-09-25: 100 mg via INTRAVENOUS

## 2020-09-25 MED ORDER — PHENYLEPHRINE HCL (PRESSORS) 10 MG/ML IV SOLN
INTRAVENOUS | Status: DC | PRN
  Administered 2020-09-25 (×2): 80 ug via INTRAVENOUS

## 2020-09-25 MED ORDER — SODIUM CHLORIDE 0.9 % IV BOLUS
1000.0000 mL | Freq: Once | INTRAVENOUS | Status: AC
  Administered 2020-09-25: 1000 mL via INTRAVENOUS

## 2020-09-25 MED ORDER — OXYCODONE HCL 5 MG PO TABS
5.0000 mg | ORAL_TABLET | Freq: Once | ORAL | Status: DC | PRN
Start: 2020-09-25 — End: 2020-09-25

## 2020-09-25 MED ORDER — ONDANSETRON HCL 4 MG/2ML IJ SOLN
4.0000 mg | Freq: Once | INTRAMUSCULAR | Status: AC
  Administered 2020-09-25: 4 mg via INTRAVENOUS
  Filled 2020-09-25: qty 2

## 2020-09-25 MED ORDER — SUGAMMADEX SODIUM 200 MG/2ML IV SOLN
INTRAVENOUS | Status: DC | PRN
  Administered 2020-09-25: 150 mg via INTRAVENOUS

## 2020-09-25 MED ORDER — PROMETHAZINE HCL 25 MG/ML IJ SOLN
12.5000 mg | Freq: Once | INTRAMUSCULAR | Status: AC
  Administered 2020-09-25: 12.5 mg via INTRAVENOUS
  Filled 2020-09-25: qty 1

## 2020-09-25 MED ORDER — SODIUM CHLORIDE 0.9 % IV SOLN: INTRAVENOUS | Status: DC

## 2020-09-25 MED ORDER — DEXAMETHASONE SODIUM PHOSPHATE 10 MG/ML IJ SOLN
INTRAMUSCULAR | Status: DC | PRN
  Administered 2020-09-25: 10 mg via INTRAVENOUS

## 2020-09-25 MED ORDER — IOHEXOL 300 MG/ML  SOLN
100.0000 mL | Freq: Once | INTRAMUSCULAR | Status: AC | PRN
  Administered 2020-09-25: 100 mL via INTRAVENOUS

## 2020-09-25 MED ORDER — PHENYLEPHRINE 40 MCG/ML (10ML) SYRINGE FOR IV PUSH (FOR BLOOD PRESSURE SUPPORT)
PREFILLED_SYRINGE | INTRAVENOUS | Status: AC
  Filled 2020-09-25: qty 10

## 2020-09-25 MED ORDER — OXYCODONE HCL 5 MG/5ML PO SOLN: 5.0000 mg | Freq: Once | ORAL | Status: DC | PRN

## 2020-09-25 MED ORDER — FENTANYL CITRATE (PF) 100 MCG/2ML IJ SOLN
INTRAMUSCULAR | Status: DC | PRN
  Administered 2020-09-25: 100 ug via INTRAVENOUS

## 2020-09-25 MED ORDER — LACTATED RINGERS IV SOLN: INTRAVENOUS | Status: DC | PRN

## 2020-09-25 MED ORDER — ONDANSETRON HCL 4 MG/2ML IJ SOLN
INTRAMUSCULAR | Status: DC | PRN
  Administered 2020-09-25: 4 mg via INTRAVENOUS

## 2020-09-25 MED ORDER — LIDOCAINE HCL (PF) 2 % IJ SOLN
INTRAMUSCULAR | Status: AC
  Filled 2020-09-25: qty 5

## 2020-09-25 MED ORDER — BUPIVACAINE HCL 0.25 % IJ SOLN
INTRAMUSCULAR | Status: DC | PRN
  Administered 2020-09-25: 7 mL

## 2020-09-25 MED ORDER — PROPOFOL 10 MG/ML IV BOLUS
INTRAVENOUS | Status: DC | PRN
  Administered 2020-09-25: 120 mg via INTRAVENOUS
  Administered 2020-09-25: 30 mg via INTRAVENOUS

## 2020-09-25 MED ORDER — LIDOCAINE 2% (20 MG/ML) 5 ML SYRINGE
INTRAMUSCULAR | Status: DC | PRN
  Administered 2020-09-25: 50 mg via INTRAVENOUS

## 2020-09-25 MED ORDER — ONDANSETRON HCL 4 MG/2ML IJ SOLN
4.0000 mg | Freq: Four times a day (QID) | INTRAMUSCULAR | Status: DC | PRN
Start: 2020-09-25 — End: 2020-09-25

## 2020-09-25 MED ORDER — SUCCINYLCHOLINE CHLORIDE 200 MG/10ML IV SOSY
PREFILLED_SYRINGE | INTRAVENOUS | Status: AC
  Filled 2020-09-25: qty 10

## 2020-09-25 MED ORDER — HYDROMORPHONE HCL 1 MG/ML IJ SOLN
0.5000 mg | INTRAMUSCULAR | Status: DC | PRN
  Administered 2020-09-25: 0.5 mg via INTRAVENOUS
  Filled 2020-09-25: qty 1

## 2020-09-25 MED ORDER — FENTANYL CITRATE (PF) 100 MCG/2ML IJ SOLN
25.0000 ug | INTRAMUSCULAR | Status: DC | PRN
Start: 2020-09-25 — End: 2020-09-25

## 2020-09-25 MED ORDER — DEXAMETHASONE SODIUM PHOSPHATE 10 MG/ML IJ SOLN
INTRAMUSCULAR | Status: AC
  Filled 2020-09-25: qty 1

## 2020-09-25 MED ORDER — LACTATED RINGERS IV SOLN
INTRAVENOUS | Status: DC | PRN
  Administered 2020-09-25: 1000 mL via INTRAVENOUS

## 2020-09-25 MED ORDER — ONDANSETRON HCL 4 MG/2ML IJ SOLN
INTRAMUSCULAR | Status: AC
  Filled 2020-09-25: qty 2

## 2020-09-25 MED ORDER — ROCURONIUM BROMIDE 10 MG/ML (PF) SYRINGE
PREFILLED_SYRINGE | INTRAVENOUS | Status: AC
  Filled 2020-09-25: qty 10

## 2020-09-25 MED ORDER — ROCURONIUM BROMIDE 10 MG/ML (PF) SYRINGE
PREFILLED_SYRINGE | INTRAVENOUS | Status: DC | PRN
  Administered 2020-09-25: 30 mg via INTRAVENOUS

## 2020-09-25 MED ORDER — FENTANYL CITRATE (PF) 250 MCG/5ML IJ SOLN
INTRAMUSCULAR | Status: AC
  Filled 2020-09-25: qty 5

## 2020-09-25 MED ORDER — SODIUM CHLORIDE 0.9 % IV SOLN
2.0000 g | Freq: Once | INTRAVENOUS | Status: AC
  Administered 2020-09-25: 2 g via INTRAVENOUS
  Filled 2020-09-25: qty 20

## 2020-09-25 SURGICAL SUPPLY — 48 items
ADH SKN CLS APL DERMABOND .7 (GAUZE/BANDAGES/DRESSINGS) ×1
APL PRP STRL LF DISP 70% ISPRP (MISCELLANEOUS) ×1
BAG SPEC RTRVL 10 TROC 200 (ENDOMECHANICALS) ×1
CABLE HIGH FREQUENCY MONO STRZ (ELECTRODE) ×2 IMPLANT
CHLORAPREP W/TINT 26 (MISCELLANEOUS) ×2 IMPLANT
COVER SURGICAL LIGHT HANDLE (MISCELLANEOUS) ×2 IMPLANT
COVER WAND RF STERILE (DRAPES) IMPLANT
CUTTER FLEX LINEAR 45M (STAPLE) ×2 IMPLANT
DECANTER SPIKE VIAL GLASS SM (MISCELLANEOUS) ×2 IMPLANT
DERMABOND ADVANCED (GAUZE/BANDAGES/DRESSINGS) ×1
DERMABOND ADVANCED .7 DNX12 (GAUZE/BANDAGES/DRESSINGS) ×1 IMPLANT
DEVICE TROCAR PUNCTURE CLOSURE (ENDOMECHANICALS) ×2 IMPLANT
DRAPE LAPAROSCOPIC ABDOMINAL (DRAPES) ×2 IMPLANT
ELECT REM PT RETURN 15FT ADLT (MISCELLANEOUS) ×2 IMPLANT
GLOVE BIO SURGEON STRL SZ7 (GLOVE) ×4 IMPLANT
GLOVE BIOGEL PI IND STRL 7.0 (GLOVE) ×1 IMPLANT
GLOVE BIOGEL PI IND STRL 7.5 (GLOVE) ×2 IMPLANT
GLOVE BIOGEL PI INDICATOR 7.0 (GLOVE) ×1
GLOVE BIOGEL PI INDICATOR 7.5 (GLOVE) ×2
GOWN STRL REUS W/ TWL XL LVL3 (GOWN DISPOSABLE) ×1 IMPLANT
GOWN STRL REUS W/TWL LRG LVL3 (GOWN DISPOSABLE) ×4 IMPLANT
GOWN STRL REUS W/TWL XL LVL3 (GOWN DISPOSABLE) ×4 IMPLANT
KIT BASIN OR (CUSTOM PROCEDURE TRAY) ×2 IMPLANT
KIT TURNOVER KIT A (KITS) IMPLANT
NS IRRIG 1000ML POUR BTL (IV SOLUTION) ×2 IMPLANT
PENCIL SMOKE EVACUATOR (MISCELLANEOUS) IMPLANT
POUCH RETRIEVAL ECOSAC 10 (ENDOMECHANICALS) ×1 IMPLANT
POUCH RETRIEVAL ECOSAC 10MM (ENDOMECHANICALS) ×2
RELOAD 45 VASCULAR/THIN (ENDOMECHANICALS) IMPLANT
RELOAD STAPLE 45 2.5 WHT GRN (ENDOMECHANICALS) IMPLANT
RELOAD STAPLE 45 3.5 BLU ETS (ENDOMECHANICALS) ×1 IMPLANT
RELOAD STAPLE TA45 3.5 REG BLU (ENDOMECHANICALS) ×2 IMPLANT
SET IRRIG TUBING LAPAROSCOPIC (IRRIGATION / IRRIGATOR) ×2 IMPLANT
SET TUBE SMOKE EVAC HIGH FLOW (TUBING) ×2 IMPLANT
SHEARS HARMONIC ACE PLUS 36CM (ENDOMECHANICALS) ×2 IMPLANT
SLEEVE XCEL OPT CAN 5 100 (ENDOMECHANICALS) ×2 IMPLANT
SOL ANTI FOG 6CC (MISCELLANEOUS) ×1 IMPLANT
SOLUTION ANTI FOG 6CC (MISCELLANEOUS) ×1
SUT MNCRL AB 4-0 PS2 18 (SUTURE) ×2 IMPLANT
SUT VICRYL 0 ENDOLOOP (SUTURE) IMPLANT
SUT VICRYL 0 UR6 27IN ABS (SUTURE) ×2 IMPLANT
TOWEL OR 17X26 10 PK STRL BLUE (TOWEL DISPOSABLE) ×2 IMPLANT
TOWEL OR NON WOVEN STRL DISP B (DISPOSABLE) ×2 IMPLANT
TRAY FOLEY MTR SLVR 14FR STAT (SET/KITS/TRAYS/PACK) ×2 IMPLANT
TRAY FOLEY MTR SLVR 16FR STAT (SET/KITS/TRAYS/PACK) IMPLANT
TRAY LAPAROSCOPIC (CUSTOM PROCEDURE TRAY) ×2 IMPLANT
TROCAR XCEL BLUNT TIP 100MML (ENDOMECHANICALS) ×2 IMPLANT
TROCAR XCEL NON-BLD 5MMX100MML (ENDOMECHANICALS) ×2 IMPLANT

## 2020-09-25 NOTE — Op Note (Signed)
Preoperative diagnosis: Acute appendicitis Postoperative diagnosis: Acute suppurative appendicitis Procedure: Laparoscopic appendectomy Surgeon: Dr. Harden Mo Anesthesia: General Estimated blood loss: Minimal Complications: None Drains: None Specimens: Appendix to pathology Sponge needle count was correct at completion Disposition recovery stable condition  Indications: 26 yof with psh of lap chole who had recent colonoscopy and then at 3 am developed sudden onset ab pain today. associated with n/v, has been having nl bms.  No fever.  Pain was not going away is throughout abdomen but mostly lower abdomen.  Nothing she was doing at home was helping.  She went to outside er and on ct was found to have appendicitis.  She was transferred the [facility] and I discussed lap appy with her.  Procedure: After informed consent was obtained the patient was taken to the operating room.  She was given antibiotics.  SCDs were placed.  She was placed under general anesthesia without complication.  She was prepped and draped in the standard sterile surgical fashion.  A surgical timeout was then performed.  I infiltrated Marcaine below her umbilicus.  I then made a vertical incision.  I grasped her fascia and incised this sharply.  The peritoneum was entered bluntly.  I then placed a 0 Vicryl pursestring sutures through the fascia.  I inserted a Hassan trocar and insufflated the abdomen to 15 mmHg pressure.  She was then placed in the Trendelenburg position.  I then inserted two 5 mm trocars in the lower abdomen under direct vision without complication.  Her appendix was noted to be acutely suppurative but not perforated.  I then was able to dissect the appendiceal mesentery and divide this with the harmonic scalpel.  I took this down to the base of the appendix at the cecum.  I divided this with a GIA stapler with a blue load.  I then placed this in a retrieval bag and removed it from the abdomen.  The staple line  was hemostatic.  There was no evidence of any injury.  I then removed the appendix in the retrieval bag.  I remove the Hassan trocar and tied my pursestring down.  I then placed an additional 0 Vicryl suture to completely obliterate the umbilical defect.  I then desufflated the abdomen to remove the trocars.  I closed these with 4-0 Monocryl and glue.  She tolerated this well was extubated and transferred to recovery stable.

## 2020-09-25 NOTE — Anesthesia Procedure Notes (Signed)
Procedure Name: Intubation Date/Time: 09/25/2020 1:33 PM Performed by: Lissa Morales, CRNA Pre-anesthesia Checklist: Patient identified, Emergency Drugs available, Suction available and Patient being monitored Patient Re-evaluated:Patient Re-evaluated prior to induction Oxygen Delivery Method: Circle system utilized Preoxygenation: Pre-oxygenation with 100% oxygen Induction Type: IV induction, Rapid sequence and Cricoid Pressure applied Ventilation: Mask ventilation without difficulty Laryngoscope Size: Mac and 4 Grade View: Grade I Tube type: Oral Number of attempts: 1 Airway Equipment and Method: Stylet and Oral airway Placement Confirmation: ETT inserted through vocal cords under direct vision,  positive ETCO2 and breath sounds checked- equal and bilateral Secured at: 21 cm Tube secured with: Tape Dental Injury: Teeth and Oropharynx as per pre-operative assessment

## 2020-09-25 NOTE — Transfer of Care (Signed)
Immediate Anesthesia Transfer of Care Note  Patient: Audrey Hall  Procedure(s) Performed: APPENDECTOMY LAPAROSCOPIC (N/A Abdomen)  Patient Location: PACU  Anesthesia Type:General  Level of Consciousness: awake, alert , oriented and patient cooperative  Airway & Oxygen Therapy: Patient Spontanous Breathing and Patient connected to face mask oxygen  Post-op Assessment: Report given to RN, Post -op Vital signs reviewed and stable and Patient moving all extremities X 4  Post vital signs: stable  Last Vitals:  Vitals Value Taken Time  BP    Temp    Pulse    Resp    SpO2      Last Pain:  Vitals:   09/25/20 1048  TempSrc:   PainSc: 3          Complications: No complications documented.

## 2020-09-25 NOTE — ED Notes (Signed)
Contacted Windell Moulding at [facility] to verified medication order.

## 2020-09-25 NOTE — H&P (Signed)
Audrey Hall is an 69 y.o. female.   Chief Complaint: ab pain HPI: 65 yof with psh of lap chole who had recent colonoscopy and then at 3 am developed sudden onset ab pain today. associated with n/v, has been having nl bms.  No fever.  Pain was not going away is throughout abdomen but mostly lower abdomen.  Nothing she was doing at home was helping.  She went to outside er and on ct was found to have appendicitis.  She was transferred the [facility]  Past Medical History:  Diagnosis Date  . Headache   . Hypercholesteremia     Past Surgical History:  Procedure Laterality Date  . BREAST CYST ASPIRATION    . BREAST EXCISIONAL BIOPSY Right   . CHOLECYSTECTOMY      Family History  Problem Relation Age of Onset  . Breast cancer Mother   . Breast cancer Sister   . Breast cancer Maternal Aunt    Social History:  reports that she has been smoking cigarettes. She has never used smokeless tobacco. She reports that she does not drink alcohol and does not use drugs.  Allergies: No Known Allergies  meds reviewed  Results for orders placed or performed during the hospital encounter of 09/25/20 (from the past 48 hour(s))  Comprehensive metabolic panel     Status: Abnormal   Collection Time: 09/25/20  7:40 AM  Result Value Ref Range   Sodium 135 135 - 145 mmol/L   Potassium 3.7 3.5 - 5.1 mmol/L   Chloride 103 98 - 111 mmol/L   CO2 22 22 - 32 mmol/L   Glucose, Bld 132 (H) 70 - 99 mg/dL    Comment: Glucose reference range applies only to samples taken after fasting for at least 8 hours.   BUN 22 8 - 23 mg/dL   Creatinine, Ser 8.31 0.44 - 1.00 mg/dL   Calcium 9.5 8.9 - 51.7 mg/dL   Total Protein 7.3 6.5 - 8.1 g/dL   Albumin 4.4 3.5 - 5.0 g/dL   AST 28 15 - 41 U/L   ALT 22 0 - 44 U/L   Alkaline Phosphatase 63 38 - 126 U/L   Total Bilirubin 0.7 0.3 - 1.2 mg/dL   GFR, Estimated >61 >60 mL/min    Comment: (NOTE) Calculated using the CKD-EPI Creatinine Equation (2021)    Anion gap 10 5 - 15     Comment: Performed at [facility], [address] [place], [place] [address]  Lipase, blood     Status: None   Collection Time: 09/25/20  7:40 AM  Result Value Ref Range   Lipase 31 11 - 51 U/L    Comment: Performed at [facility], [address] [place], [place] [address]  CBC with Diff     Status: Abnormal   Collection Time: 09/25/20  7:40 AM  Result Value Ref Range   WBC 13.5 (H) 4.0 - 10.5 K/uL   RBC 4.78 3.87 - 5.11 MIL/uL   Hemoglobin 14.7 12.0 - 15.0 g/dL   HCT 85.4 62.7 - 03.5 %   MCV 90.6 80.0 - 100.0 fL   MCH 30.8 26.0 - 34.0 pg   MCHC 33.9 30.0 - 36.0 g/dL   RDW 00.9 38.1 - 82.9 %   Platelets 407 (H) 150 - 400 K/uL   nRBC 0.0 0.0 - 0.2 %   Neutrophils Relative % 81 %   Neutro Abs 11.0 (H) 1.7 - 7.7 K/uL  Lymphocytes Relative 13 %   Lymphs Abs 1.7 0.7 - 4.0 K/uL   Monocytes Relative 4 %   Monocytes Absolute 0.5 0.1 - 1.0 K/uL   Eosinophils Relative 1 %   Eosinophils Absolute 0.2 0.0 - 0.5 K/uL   Basophils Relative 1 %   Basophils Absolute 0.1 0.0 - 0.1 K/uL   Immature Granulocytes 0 %   Abs Immature Granulocytes 0.03 0.00 - 0.07 K/uL    Comment: Performed at [facility], [address] [place], [place] [address]  Urinalysis, Routine w reflex microscopic Urine, Clean Catch     Status: Abnormal   Collection Time: 09/25/20  9:08 AM  Result Value Ref Range   Color, Urine YELLOW YELLOW   APPearance HAZY (A) CLEAR   Specific Gravity, Urine 1.025 1.005 - 1.030   pH 7.0 5.0 - 8.0   Glucose, UA NEGATIVE NEGATIVE mg/dL   Hgb urine dipstick SMALL (A) NEGATIVE   Bilirubin Urine NEGATIVE NEGATIVE   Ketones, ur NEGATIVE NEGATIVE mg/dL   Protein, ur NEGATIVE NEGATIVE mg/dL   Nitrite NEGATIVE NEGATIVE   Leukocytes,Ua NEGATIVE NEGATIVE    Comment: Performed at [facility] [place], [address] [place], [place] [address]  Urinalysis, Microscopic (reflex)     Status: Abnormal   Collection Time: 09/25/20  9:08 AM  Result Value Ref  Range   RBC / HPF 6-10 0 - 5 RBC/hpf   WBC, UA 0-5 0 - 5 WBC/hpf   Bacteria, UA FEW (A) NONE SEEN   Squamous Epithelial / LPF 0-5 0 - 5   Mucus PRESENT     Comment: Performed at [facility] [place], [address] [place], [place] [address]  Resp Panel by RT-PCR (Flu A&B, Covid) Nasopharyngeal Swab     Status: None   Collection Time: 09/25/20 10:37 AM   Specimen: Nasopharyngeal Swab; Nasopharyngeal(NP) swabs in vial transport medium  Result Value Ref Range   SARS Coronavirus 2 by RT PCR NEGATIVE NEGATIVE    Comment: (NOTE) SARS-CoV-2 target nucleic acids are NOT DETECTED.  The SARS-CoV-2 RNA is generally detectable in upper respiratory specimens during the acute phase of infection. The lowest concentration of SARS-CoV-2 viral copies this assay can detect is 138 copies/mL. A negative result does not preclude SARS-Cov-2 infection and should not be used as the sole basis for treatment or other patient management decisions. A negative result may occur with  improper specimen collection/handling, submission of specimen other than nasopharyngeal swab, presence of viral mutation(s) within the areas targeted by this assay, and inadequate number of viral copies(<138 copies/mL). A negative result must be combined with clinical observations, patient history, and epidemiological information. The expected result is Negative.  Fact Sheet for Patients:  BloggerCourse.comhttps://www.fda.gov/media/152166/download  Fact Sheet for Healthcare Providers:  SeriousBroker.ithttps://www.fda.gov/media/152162/download  This test is no t yet approved or cleared by the [place] FDA and  has been authorized for detection and/or diagnosis of SARS-CoV-2 by FDA under an Emergency Use Authorization (EUA). This EUA will remain  in effect (meaning this test can be used) for the duration of the COVID-19 declaration under Section 564(b)(1) of the Act, 21 U.S.C.section 360bbb-3(b)(1), unless the authorization is terminated  or revoked  sooner.       Influenza A by PCR NEGATIVE NEGATIVE   Influenza B by PCR NEGATIVE NEGATIVE    Comment: (NOTE) The Xpert Xpress SARS-CoV-2/FLU/RSV plus assay is intended as an aid in the diagnosis of influenza from Nasopharyngeal swab specimens and should  not be used as a sole basis for treatment. Nasal washings and aspirates are unacceptable for Xpert Xpress SARS-CoV-2/FLU/RSV testing.  Fact Sheet for Patients: BloggerCourse.com  Fact Sheet for Healthcare Providers: SeriousBroker.it  This test is not yet approved or cleared by the [place] FDA and has been authorized for detection and/or diagnosis of SARS-CoV-2 by FDA under an Emergency Use Authorization (EUA). This EUA will remain in effect (meaning this test can be used) for the duration of the COVID-19 declaration under Section 564(b)(1) of the Act, 21 U.S.C. section 360bbb-3(b)(1), unless the authorization is terminated or revoked.  Performed at [facility], [address] [place], [place] [address]    CT ABDOMEN PELVIS W CONTRAST  Result Date: 09/25/2020 CLINICAL DATA:  Epigastric and mid abdominal pain and vomiting for 6 hours. Recent colonoscopy 3 days ago with findings of diverticulosis. EXAM: CT ABDOMEN AND PELVIS WITH CONTRAST TECHNIQUE: Multidetector CT imaging of the abdomen and pelvis was performed using the standard protocol following bolus administration of intravenous contrast. CONTRAST:  OMNIPAQUE IOHEXOL 300 MG/ML  SOLN COMPARISON:  CT abdomen pelvis 05/24/2020 FINDINGS: Lower chest: No acute abnormality. Hepatobiliary: No focal liver abnormality is seen. Status post cholecystectomy. Stable intra and extrahepatic biliary duct dilation secondary to cholecystectomy. Pancreas: Unremarkable. Chronic duct dilation. No surrounding inflammatory changes. Which is Spleen: Normal in size without focal abnormality. Adrenals/Urinary Tract: Adrenal glands  are unremarkable. Kidneys are normal, without renal calculi, focal lesion, or hydronephrosis. Bladder is unremarkable. Stomach/Bowel: Stomach is within normal limits. The appendix is diffusely dilated measuring up to 1.3 cm in diameter (series 2, image 60) with multiple appendicoliths. Small amount of surrounding inflammatory stranding in the fat. No free air or adjacent fluid collection. No evidence of bowel wall thickening, distention, or inflammatory changes. Multiple colonic diverticula without evidence of diverticulitis. Harriett Sine the left a the after Vascular/Lymphatic: Aortic atherosclerosis. No enlarged abdominal or pelvic lymph nodes. Reproductive: Status post hysterectomy. No adnexal masses. Other: No abdominal wall hernia or abnormality. No abdominopelvic ascites. Musculoskeletal: Multilevel degenerative changes in the thoracic spine. No acute osseous abnormality. IMPRESSION: 1. Findings consistent with acute uncomplicated appendicitis. No evidence of perforation or abscess formation. 2. Colonic diverticulosis without evidence of diverticulitis. 3. Aortic atherosclerosis. Aortic Atherosclerosis (ICD10-I70.0). Electronically Signed   By: Emmaline Kluver M.D.   On: 09/25/2020 10:00    Review of Systems  Constitutional: Positive for fatigue. Negative for fever.  Gastrointestinal: Positive for abdominal pain, nausea and vomiting. Negative for abdominal distention and constipation.  Genitourinary: Negative for dysuria.    Blood pressure 112/69, pulse 84, temperature 97.8 F (36.6 C), resp. rate 18, height 5\' 1"  (1.549 m), weight 58.1 kg, SpO2 98 %. Physical Exam Constitutional:      General: She is not in acute distress.    Appearance: She is well-developed.  Cardiovascular:     Rate and Rhythm: Normal rate and regular rhythm.  Pulmonary:     Effort: Pulmonary effort is normal.     Breath sounds: Normal breath sounds. No wheezing.  Abdominal:     General: Abdomen is flat. A surgical scar is  present. Bowel sounds are normal. There is no distension.     Tenderness: There is generalized abdominal tenderness (mostly lower abdomen) and tenderness in the right lower quadrant.     Hernia: No hernia is present.  Skin:    General: Skin is warm and dry.     Capillary Refill: Capillary refill takes less than 2 seconds.  Neurological:  General: No focal deficit present.     Mental Status: She is alert and oriented to person, place, and time.      Assessment/Plan Acute appendicitis -this looks by ct to be acute appendicitis although the time course after csc is soon and she does have some tenderness throughout. This is mostly lower quadrant.  Wbc is up. Discussed proceeding to OR for lap appy. If not perforated as appears likely dc home today from pacu. Discussed surgery with patient.   Emelia Loron, MD 09/25/2020, 12:59 PM

## 2020-09-25 NOTE — Brief Op Note (Signed)
09/25/2020  2:05 PM  PATIENT:  Audrey Hall  69 y.o. female  PRE-OPERATIVE DIAGNOSIS:  Appendicitis  POST-OPERATIVE DIAGNOSIS:  appendicitis  PROCEDURE:  Procedure(s): APPENDECTOMY LAPAROSCOPIC (N/A)  SURGEON:  Surgeon(s) and Role:    * Emelia Loron, MD - Primary  PHYSICIAN ASSISTANT:   ASSISTANTS: none   ANESTHESIA:   general  EBL:  5 mL   BLOOD ADMINISTERED:none  DRAINS: none   LOCAL MEDICATIONS USED:  MARCAINE     SPECIMEN:  Excision  DISPOSITION OF SPECIMEN:  PATHOLOGY  COUNTS:  YES  TOURNIQUET:  * No tourniquets in log *  DICTATION: .Dragon Dictation  PLAN OF CARE: Discharge to home after PACU  PATIENT DISPOSITION:  PACU - hemodynamically stable.   Delay start of Pharmacological VTE agent (>24hrs) due to surgical blood loss or risk of bleeding: not applicable

## 2020-09-25 NOTE — ED Triage Notes (Signed)
Pt reports having awoke this morning with abd pain with n/v

## 2020-09-25 NOTE — ED Notes (Signed)
ED Provider at bedside. 

## 2020-09-25 NOTE — Anesthesia Procedure Notes (Deleted)
Performed by: Naketa Daddario E, CRNA       

## 2020-09-25 NOTE — Discharge Instructions (Signed)
Laparoscopic Appendectomy, Adult, Care After This sheet gives you information about how to care for yourself after your procedure. Your health care provider may also give you more specific instructions. If you have problems or questions, contact your health care provider. What can I expect after the procedure? After the procedure, it is common to have:  Little energy for normal activities.  Mild pain in the area where the incisions were made.  Difficulty passing stool (constipation). This can be caused by: ? Pain medicine. ? A decrease in your activity. Follow these instructions at home: Medicines  Take over-the-counter and prescription medicines only as told by your health care provider.  If you were prescribed an antibiotic medicine, take it as told by your health care provider. Do not stop taking the antibiotic even if you start to feel better.  Do not drive or use heavy machinery while taking prescription pain medicine.  Ask your health care provider if the medicine prescribed to you can cause constipation. You may need to take steps to prevent or treat constipation, such as: ? Drink enough fluid to keep your urine pale yellow. ? Take over-the-counter or prescription medicines. ? Eat foods that are high in fiber, such as beans, whole grains, and fresh fruits and vegetables. ? Limit foods that are high in fat and processed sugars, such as fried or sweet foods. Incision care   Follow instructions from your health care provider about how to take care of your incisions. Make sure you: ? Wash your hands with soap and water before and after you change your bandage (dressing). If soap and water are not available, use hand sanitizer. ? Change your dressing as told by your health care provider. ? Leave stitches (sutures), skin glue, or adhesive strips in place. These skin closures may need to stay in place for 2 weeks or longer. If adhesive strip edges start to loosen and curl up, you  may trim the loose edges. Do not remove adhesive strips completely unless your health care provider tells you to do that.  Check your incision areas every day for signs of infection. Check for: ? Redness, swelling, or pain. ? Fluid or blood. ? Warmth. ? Pus or a bad smell. Bathing  Keep your incisions clean and dry. Clean them as often as told by your health care provider. To do this: 1. Gently wash the incisions with soap and water. 2. Rinse the incisions with water to remove all soap. 3. Pat the incisions dry with a clean towel. Do not rub the incisions.  Do not take baths, swim, or use a hot tub for 2 weeks, or until your health care provider approves. You may take showers after 48 hours. Activity   Do not drive for 24 hours if you were given a sedative during your procedure.  Rest after the procedure. Return to your normal activities as told by your health care provider. Ask your health care provider what activities are safe for you.  For 3 weeks, or for as long as told by your health care provider: ? Do not lift anything that is heavier than 10 lb (4.5 kg), or the limit that you are told. ? Do not play contact sports. General instructions  If you were sent home with a drain, follow instructions from your health care provider about how to care for it.  Take deep breaths. This helps to prevent your lungs from developing an infection (pneumonia).  Keep all follow-up visits as told by your  health care provider. This is important. Contact a health care provider if:  You have redness, swelling, or pain around an incision.  You have fluid or blood coming from an incision.  Your incision feels warm to the touch.  You have pus or a bad smell coming from an incision or dressing.  Your incision edges break open after your sutures have been removed.  You have increasing pain in your shoulders.  You feel dizzy or you faint.  You develop shortness of breath.  You keep feeling  nauseous or you are vomiting.  You have diarrhea or you cannot control your bowel functions.  You lose your appetite.  You develop swelling or pain in your legs.  You develop a rash. Get help right away if you have:  A fever.  Difficulty breathing.  Sharp pains in your chest. Summary  After a laparoscopic appendectomy, it is common to have little energy for normal activities, mild pain in the area of the incisions, and constipation.  Infection is the most common complication after this procedure. Follow your health care provider's instructions about caring for yourself after the procedure.  Rest after the procedure. Return to your normal activities as told by your health care provider.  Contact your health care provider if you notice signs of infection around your incisions or you develop shortness of breath. Get help right away if you have a fever, chest pain, or difficulty breathing. This information is not intended to replace advice given to you by your health care provider. Make sure you discuss any questions you have with your health care provider. Document Revised: 04/04/2018 Document Reviewed: 04/04/2018 Elsevier Patient Education  2020 [employer].                                [facility] LAPAROSCOPIC SURGERY: POST OP INSTRUCTIONS  Always review your discharge instruction sheet given to you by the facility where your surgery was performed. IF YOU HAVE DISABILITY OR FAMILY LEAVE FORMS, YOU MUST BRING THEM TO THE OFFICE FOR PROCESSING.   DO NOT GIVE THEM TO YOUR DOCTOR.  1. A prescription for pain medication may be given to you upon discharge.  Take your pain medication as prescribed, if needed.  If narcotic pain medicine is not needed, then you may take acetaminophen (Tylenol), naprosyn (Alleve), or ibuprofen (Advil) as needed. 2. Take your usually prescribed medications unless otherwise directed. 3. If you need a refill on your pain medication,  please contact your pharmacy.  They will contact our office to request authorization. Prescriptions will not be filled after 5pm or on week-ends. 4. You should follow a light diet the first few days after arrival home, such as soup and crackers, etc.  Be sure to include lots of fluids daily. 5. Most patients will experience some swelling and bruising in the area of the incisions.  Ice packs will help.  Swelling and bruising can take several days to resolve.  6. It is common to experience some constipation if taking pain medication after surgery.  Increasing fluid intake and taking a stool softener (such as Colace) will usually help or prevent this problem from occurring.  A mild laxative (Milk of Magnesia or Miralax) should be taken according to package instructions if there are no bowel movements after 48 hours. 7. Unless discharge instructions indicate otherwise, you may remove your bandages 48 hours after surgery, and you may shower  at that time.  You may have steri-strips (small skin tapes) in place directly over the incision.  These strips should be left on the skin for 7-10 days.  If your surgeon used skin glue on the incision, you may shower in 24 hours.  The glue will flake off over the next 2-3 weeks.  Any sutures or staples will be removed at the office during your follow-up visit. 8. ACTIVITIES:  You may resume regular (light) daily activities beginning the next day--such as daily self-care, walking, climbing stairs--gradually increasing activities as tolerated.  You may have sexual intercourse when it is comfortable.  Refrain from any heavy lifting or straining until approved by your doctor. a. You may drive when you are no longer taking prescription pain medication, you can comfortably wear a seatbelt, and you can safely maneuver your car and apply brakes. b. RETURN TO WORK:  __________________________________________________________ 9. You should see your doctor in the office for a follow-up  appointment approximately 2-3 weeks after your surgery.  Make sure that you call for this appointment within a day or two after you arrive home to insure a convenient appointment time. 10. OTHER INSTRUCTIONS: __________________________________________________________________________________________________________________________ __________________________________________________________________________________________________________________________ WHEN TO CALL YOUR DOCTOR: 1. Fever over 101.0 2. Inability to urinate 3. Continued bleeding from incision. 4. Increased pain, redness, or drainage from the incision. 5. Increasing abdominal pain  The clinic staff is available to answer your questions during regular business hours.  Please don't hesitate to call and ask to speak to one of the nurses for clinical concerns.  If you have a medical emergency, go to the nearest emergency room or call 911.  A surgeon from [facility] is always on call at the hospital. [address], [address], [place], [place]  [address] ? [address] [address], [place], [place]   [id number] ? [phone] ? FAX [phone] Web site: [email or web address]

## 2020-09-25 NOTE — ED Notes (Signed)
Patient transported to CT 

## 2020-09-25 NOTE — ED Provider Notes (Signed)
[facility] EMERGENCY DEPARTMENT Provider Note   CSN: [id number] Arrival date & time: 09/25/20  2197     History Chief Complaint  Patient presents with  . Abdominal Pain  . Nausea  . Emesis    Audrey Hall is a 69 y.o. female.  The history is provided by the patient.  Abdominal Pain Pain location:  Generalized Pain quality: cramping   Pain radiates to:  Does not radiate Pain severity:  Severe Duration: since this am. Timing:  Constant Chronicity:  Recurrent Context: previous surgery   Context: not trauma   Relieved by:  Nothing Worsened by:  Nothing Ineffective treatments:  None tried Associated symptoms: nausea and vomiting   Associated symptoms: no dysuria and no fever   Emesis Associated symptoms: abdominal pain   Associated symptoms: no fever        Past Medical History:  Diagnosis Date  . Headache   . Hypercholesteremia     There are no problems to display for this patient.   Past Surgical History:  Procedure Laterality Date  . BREAST CYST ASPIRATION    . BREAST EXCISIONAL BIOPSY Right   . CHOLECYSTECTOMY       OB History   No obstetric history on file.     Family History  Problem Relation Age of Onset  . Breast cancer Mother   . Breast cancer Sister   . Breast cancer Maternal Aunt     Social History   Tobacco Use  . Smoking status: Current Every Day Smoker    Types: Cigarettes  . Smokeless tobacco: Never Used  Substance Use Topics  . Alcohol use: No  . Drug use: No    Home Medications Prior to Admission medications   Medication Sig Start Date End Date Taking? Authorizing Provider  ALPRAZolam Prudy Feeler) 0.5 MG tablet Take 0.5 mg by mouth at bedtime as needed for anxiety.    [provider]  aspirin EC 81 MG tablet Take 81 mg by mouth daily.    [provider]  atorvastatin (LIPITOR) 10 MG tablet Take 10 mg by mouth daily.    [provider]  Cholecalciferol (VITAMIN D3) 5000 UNITS CAPS Take  1 capsule by mouth daily.    [provider]  esomeprazole (NEXIUM) 40 MG capsule Take 40 mg by mouth daily at 12 noon.    [provider]  estrogens, conjugated, (PREMARIN) 1.25 MG tablet Take 1.25 mg by mouth daily.    [provider]  gemfibrozil (LOPID) 600 MG tablet Take 600 mg by mouth 2 (two) times daily before a meal.    [provider]  medroxyPROGESTERone (PROVERA) 2.5 MG tablet Take 2.5 mg by mouth daily.    [provider]  ondansetron (ZOFRAN-ODT) 4 MG disintegrating tablet Take 1 tablet (4 mg total) by mouth every 8 (eight) hours as needed for nausea or vomiting. 05/24/20   Terrilee Files, MD  temazepam (RESTORIL) 15 MG capsule Take 15 mg by mouth at bedtime as needed for sleep.    [provider]  Vitamin D, Ergocalciferol, (DRISDOL) 50000 UNITS CAPS capsule Take 50,000 Units by mouth every 7 (seven) days.    [provider]    Allergies    Patient has no known allergies.  Review of Systems   Review of Systems  Constitutional: Negative for fever.  Gastrointestinal: Positive for abdominal pain, nausea and vomiting.  Genitourinary: Negative for dysuria.  All other systems reviewed and are negative.   Physical Exam  Updated Vital Signs BP 127/68 (BP Location: Right Arm)   Pulse 80   Temp 97.6 F (36.4 C) (Oral)   Resp 18   Ht 1.549 m (5\' 1" )   Wt 58.1 kg   SpO2 92%   BMI 24.19 kg/m   Physical Exam Vitals and nursing note reviewed.  Constitutional:      Appearance: She is well-developed and well-nourished. She is ill-appearing.  HENT:     Head: Normocephalic and atraumatic.     Right Ear: External ear normal.     Left Ear: External ear normal.  Eyes:     General: No scleral icterus.       Right eye: No discharge.        Left eye: No discharge.     Conjunctiva/sclera: Conjunctivae normal.  Neck:     Trachea: No tracheal deviation.  Cardiovascular:     Rate and Rhythm: Normal rate and regular  rhythm.     Pulses: Intact distal pulses.  Pulmonary:     Effort: Pulmonary effort is normal. No respiratory distress.     Breath sounds: Normal breath sounds. No stridor. No wheezing or rales.  Abdominal:     General: Bowel sounds are normal. There is no distension.     Palpations: Abdomen is soft.     Tenderness: There is generalized abdominal tenderness. There is no guarding or rebound.  Musculoskeletal:        General: No tenderness or edema.     Cervical back: Neck supple.  Skin:    General: Skin is warm and dry.     Findings: No rash.  Neurological:     Mental Status: She is alert.     Cranial Nerves: No cranial nerve deficit (no facial droop, extraocular movements intact, no slurred speech).     Sensory: No sensory deficit.     Motor: No abnormal muscle tone or seizure activity.     Coordination: Coordination normal.     Deep Tendon Reflexes: Strength normal.  Psychiatric:        Mood and Affect: Mood and affect normal.     ED Results / Procedures / Treatments   Labs (all labs ordered are listed, but only abnormal results are displayed) Labs Reviewed  COMPREHENSIVE METABOLIC PANEL - Abnormal; Notable for the following components:      Result Value   Glucose, Bld 132 (*)    All other components within normal limits  CBC WITH DIFFERENTIAL/PLATELET - Abnormal; Notable for the following components:   WBC 13.5 (*)    Platelets 407 (*)    Neutro Abs 11.0 (*)    All other components within normal limits  URINALYSIS, ROUTINE W REFLEX MICROSCOPIC - Abnormal; Notable for the following components:   APPearance HAZY (*)    Hgb urine dipstick SMALL (*)    All other components within normal limits  URINALYSIS, MICROSCOPIC (REFLEX) - Abnormal; Notable for the following components:   Bacteria, UA FEW (*)    All other components within normal limits  RESP PANEL BY RT-PCR (FLU A&B, COVID) ARPGX2  LIPASE, BLOOD    EKG None  Radiology CT ABDOMEN PELVIS W CONTRAST  Result  Date: 09/25/2020 CLINICAL DATA:  Epigastric and mid abdominal pain and vomiting for 6 hours. Recent colonoscopy 3 days ago with findings of diverticulosis. EXAM: CT ABDOMEN AND PELVIS WITH CONTRAST TECHNIQUE: Multidetector CT imaging of the abdomen and pelvis was performed using the standard protocol following bolus administration of intravenous contrast. CONTRAST:  100mL  OMNIPAQUE IOHEXOL 300 MG/ML  SOLN COMPARISON:  CT abdomen pelvis 05/24/2020 FINDINGS: Lower chest: No acute abnormality. Hepatobiliary: No focal liver abnormality is seen. Status post cholecystectomy. Stable intra and extrahepatic biliary duct dilation secondary to cholecystectomy. Pancreas: Unremarkable. Chronic duct dilation. No surrounding inflammatory changes. Which is Spleen: Normal in size without focal abnormality. Adrenals/Urinary Tract: Adrenal glands are unremarkable. Kidneys are normal, without renal calculi, focal lesion, or hydronephrosis. Bladder is unremarkable. Stomach/Bowel: Stomach is within normal limits. The appendix is diffusely dilated measuring up to 1.3 cm in diameter (series 2, image 60) with multiple appendicoliths. Small amount of surrounding inflammatory stranding in the fat. No free air or adjacent fluid collection. No evidence of bowel wall thickening, distention, or inflammatory changes. Multiple colonic diverticula without evidence of diverticulitis. Harriett Sine the left a the after Vascular/Lymphatic: Aortic atherosclerosis. No enlarged abdominal or pelvic lymph nodes. Reproductive: Status post hysterectomy. No adnexal masses. Other: No abdominal wall hernia or abnormality. No abdominopelvic ascites. Musculoskeletal: Multilevel degenerative changes in the thoracic spine. No acute osseous abnormality. IMPRESSION: 1. Findings consistent with acute uncomplicated appendicitis. No evidence of perforation or abscess formation. 2. Colonic diverticulosis without evidence of diverticulitis. 3. Aortic atherosclerosis. Aortic  Atherosclerosis (ICD10-I70.0). Electronically Signed   By: Emmaline Kluver M.D.   On: 09/25/2020 10:00    Procedures Procedures (including critical care time)  Medications Ordered in ED Medications  sodium chloride 0.9 % bolus 1,000 mL (1,000 mLs Intravenous New Bag/Given 09/25/20 0746)    And  0.9 %  sodium chloride infusion ( Intravenous New Bag/Given 09/25/20 0956)  HYDROmorphone (DILAUDID) injection 0.5 mg (0.5 mg Intravenous Given 09/25/20 0748)  cefTRIAXone (ROCEPHIN) 2 g in sodium chloride 0.9 % 100 mL IVPB (has no administration in time range)  ondansetron (ZOFRAN) injection 4 mg (4 mg Intravenous Given 09/25/20 0746)  ondansetron (ZOFRAN) injection 4 mg (4 mg Intravenous Given 09/25/20 0826)  iohexol (OMNIPAQUE) 300 MG/ML solution 100 mL (100 mLs Intravenous Contrast Given 09/25/20 0916)  promethazine (PHENERGAN) injection 12.5 mg (12.5 mg Intravenous Given 09/25/20 4270)    ED Course  I have reviewed the triage vital signs and the nursing notes.  Pertinent labs & imaging results that were available during my care of the patient were reviewed by me and considered in my medical decision making (see chart for details).  Clinical Course as of 09/25/20 1042  Sat Sep 25, 2020  0823 Pain improved.  Still vomiting.  Additional zofran ordered [JK]  0830 Previous records reviewed.  Previous CT scan from August visit for abdominal pain also reviewed [JK]  0830 White blood cell count elevated.  Lipase and metabolic panel normal. [JK]  0831 Patient still having persistent pain and nausea.  No signs of hepatitis or pancreatitis on labs. We will proceed with CT scan.  ?obstruction [JK]  0831 Patient still having episodes of nausea and vomiting despite additional Zofran [JK]  1012 CT scan findings reviewed.  CT scan suggest acute appendicitis.  No evidence of perforation or abscess.  I will consult surgery [JK]  1041 Discussed with Dr Dwain Sarna.  Will transfer to [facility] pre op area [JK]     Clinical Course User Index [JK] Linwood Dibbles, MD   MDM Rules/Calculators/A&P                          Patient presented to the ED for evaluation of abdominal pain.  Patient had persistent pain and vomiting in the ED.  She required multiple doses  of antiemetics as well as pain medications.  Patient's laboratory tests were notable for an elevated white blood cell count.  No signs of hepatitis or pancreatitis on laboratory testing.  CT scan was performed because of her persistent pain and it shows evidence of acute appendicitis.  Patient is currently more comfortable.  I will consult with general surgery as patient will require transfer  for further treatment of her acute appendicitis. Final Clinical Impression(s) / ED Diagnoses Final diagnoses:  Acute appendicitis, unspecified acute appendicitis type      Linwood Dibbles, MD 09/25/20 1042

## 2020-09-25 NOTE — Anesthesia Preprocedure Evaluation (Signed)
Anesthesia Evaluation  Patient identified by MRN, date of birth, ID band Patient awake    Reviewed: Allergy & Precautions, H&P , NPO status , Patient's Chart, lab work & pertinent test results  Airway Mallampati: II   Neck ROM: full    Dental   Pulmonary Current Smoker,    breath sounds clear to auscultation       Cardiovascular  Rhythm:regular Rate:Normal  hypercholesterolemia   Neuro/Psych  Headaches,    GI/Hepatic appendicitis   Endo/Other    Renal/GU      Musculoskeletal   Abdominal   Peds  Hematology   Anesthesia Other Findings   Reproductive/Obstetrics                             Anesthesia Physical Anesthesia Plan  ASA: II and emergent  Anesthesia Plan: General   Post-op Pain Management:    Induction:   PONV Risk Score and Plan: 2 and Ondansetron, Dexamethasone, Midazolam and Treatment may vary due to age or medical condition  Airway Management Planned: Oral ETT  Additional Equipment:   Intra-op Plan:   Post-operative Plan: Extubation in OR  Informed Consent: I have reviewed the patients History and Physical, chart, labs and discussed the procedure including the risks, benefits and alternatives for the proposed anesthesia with the patient or authorized representative who has indicated his/her understanding and acceptance.       Plan Discussed with: CRNA, Anesthesiologist and Surgeon  Anesthesia Plan Comments:         Anesthesia Quick Evaluation

## 2020-09-27 ENCOUNTER — Encounter (HOSPITAL_COMMUNITY): Payer: Self-pay | Admitting: General Surgery

## 2020-09-28 LAB — SURGICAL PATHOLOGY

## 2020-09-28 NOTE — Anesthesia Postprocedure Evaluation (Signed)
Anesthesia Post Note  Patient: Audrey Hall  Procedure(s) Performed: APPENDECTOMY LAPAROSCOPIC (N/A Abdomen)     Patient location during evaluation: PACU Anesthesia Type: General Level of consciousness: awake and alert Pain management: pain level controlled Vital Signs Assessment: post-procedure vital signs reviewed and stable Respiratory status: spontaneous breathing, nonlabored ventilation, respiratory function stable and patient connected to nasal cannula oxygen Cardiovascular status: blood pressure returned to baseline and stable Postop Assessment: no apparent nausea or vomiting Anesthetic complications: no   No complications documented.  Last Vitals:  Vitals:   09/25/20 1415 09/25/20 1530  BP: (!) 146/82   Pulse: 83   Resp: (!) 28   Temp: 36.4 C 36.9 C  SpO2: 100%     Last Pain:  Vitals:   09/25/20 1515  TempSrc:   PainSc: 0-No pain                 Stephonie Wilcoxen S

## 2020-10-06 DIAGNOSIS — H04123 Dry eye syndrome of bilateral lacrimal glands: Secondary | ICD-10-CM | POA: Diagnosis not present

## 2020-10-06 DIAGNOSIS — H17823 Peripheral opacity of cornea, bilateral: Secondary | ICD-10-CM | POA: Diagnosis not present

## 2020-10-06 DIAGNOSIS — H43813 Vitreous degeneration, bilateral: Secondary | ICD-10-CM | POA: Diagnosis not present

## 2020-10-06 DIAGNOSIS — Z961 Presence of intraocular lens: Secondary | ICD-10-CM | POA: Diagnosis not present

## 2020-12-09 ENCOUNTER — Other Ambulatory Visit: Payer: Self-pay | Admitting: Family Medicine

## 2020-12-09 DIAGNOSIS — Z1231 Encounter for screening mammogram for malignant neoplasm of breast: Secondary | ICD-10-CM

## 2021-02-01 ENCOUNTER — Ambulatory Visit
Admission: RE | Admit: 2021-02-01 | Discharge: 2021-02-01 | Disposition: A | Discharge status: Home / Self Care | Payer: Federal, State, Local not specified - PPO | Source: Ambulatory Visit | Attending: Family Medicine | Admitting: Family Medicine

## 2021-02-01 ENCOUNTER — Other Ambulatory Visit: Payer: Self-pay

## 2021-02-01 DIAGNOSIS — Z1231 Encounter for screening mammogram for malignant neoplasm of breast: Secondary | ICD-10-CM

## 2021-02-09 DIAGNOSIS — Z0000 Encounter for general adult medical examination without abnormal findings: Secondary | ICD-10-CM | POA: Diagnosis not present

## 2021-02-09 DIAGNOSIS — E7800 Pure hypercholesterolemia, unspecified: Secondary | ICD-10-CM | POA: Diagnosis not present

## 2021-02-09 DIAGNOSIS — Z13220 Encounter for screening for lipoid disorders: Secondary | ICD-10-CM | POA: Diagnosis not present

## 2021-02-09 DIAGNOSIS — F411 Generalized anxiety disorder: Secondary | ICD-10-CM | POA: Diagnosis not present

## 2021-02-09 DIAGNOSIS — Z23 Encounter for immunization: Secondary | ICD-10-CM | POA: Diagnosis not present

## 2021-02-09 DIAGNOSIS — N951 Menopausal and female climacteric states: Secondary | ICD-10-CM | POA: Diagnosis not present

## 2021-02-09 DIAGNOSIS — E559 Vitamin D deficiency, unspecified: Secondary | ICD-10-CM | POA: Diagnosis not present

## 2021-02-09 DIAGNOSIS — J449 Chronic obstructive pulmonary disease, unspecified: Secondary | ICD-10-CM | POA: Diagnosis not present

## 2021-05-05 DIAGNOSIS — R030 Elevated blood-pressure reading, without diagnosis of hypertension: Secondary | ICD-10-CM | POA: Diagnosis not present

## 2021-05-05 DIAGNOSIS — F4321 Adjustment disorder with depressed mood: Secondary | ICD-10-CM | POA: Diagnosis not present

## 2022-02-17 ENCOUNTER — Other Ambulatory Visit: Payer: Self-pay | Admitting: Family Medicine

## 2022-02-17 DIAGNOSIS — M810 Age-related osteoporosis without current pathological fracture: Secondary | ICD-10-CM

## 2022-03-20 ENCOUNTER — Other Ambulatory Visit: Payer: Self-pay | Admitting: Family Medicine

## 2022-03-20 DIAGNOSIS — M810 Age-related osteoporosis without current pathological fracture: Secondary | ICD-10-CM

## 2022-03-21 ENCOUNTER — Ambulatory Visit
Admission: RE | Admit: 2022-03-21 | Discharge: 2022-03-21 | Disposition: A | Discharge status: Home / Self Care | Payer: Federal, State, Local not specified - PPO | Source: Ambulatory Visit | Attending: Family Medicine | Admitting: Family Medicine

## 2022-03-21 ENCOUNTER — Other Ambulatory Visit: Payer: Self-pay | Admitting: Family Medicine

## 2022-03-21 DIAGNOSIS — M810 Age-related osteoporosis without current pathological fracture: Secondary | ICD-10-CM

## 2022-03-21 DIAGNOSIS — Z1231 Encounter for screening mammogram for malignant neoplasm of breast: Secondary | ICD-10-CM

## 2022-03-23 ENCOUNTER — Other Ambulatory Visit: Payer: Self-pay | Admitting: Family Medicine

## 2022-03-23 DIAGNOSIS — R928 Other abnormal and inconclusive findings on diagnostic imaging of breast: Secondary | ICD-10-CM

## 2022-04-03 ENCOUNTER — Other Ambulatory Visit: Payer: Self-pay | Admitting: *Deleted

## 2022-04-03 DIAGNOSIS — Z87891 Personal history of nicotine dependence: Secondary | ICD-10-CM

## 2022-04-03 DIAGNOSIS — F17210 Nicotine dependence, cigarettes, uncomplicated: Secondary | ICD-10-CM

## 2022-04-03 DIAGNOSIS — Z122 Encounter for screening for malignant neoplasm of respiratory organs: Secondary | ICD-10-CM

## 2022-04-12 ENCOUNTER — Ambulatory Visit
Admission: RE | Admit: 2022-04-12 | Discharge: 2022-04-12 | Disposition: A | Discharge status: Home / Self Care | Payer: Federal, State, Local not specified - PPO | Source: Ambulatory Visit | Attending: Family Medicine | Admitting: Family Medicine

## 2022-04-12 ENCOUNTER — Other Ambulatory Visit: Payer: Self-pay | Admitting: Family Medicine

## 2022-04-12 ENCOUNTER — Ambulatory Visit: Payer: Federal, State, Local not specified - PPO

## 2022-04-12 DIAGNOSIS — R928 Other abnormal and inconclusive findings on diagnostic imaging of breast: Secondary | ICD-10-CM

## 2022-04-12 DIAGNOSIS — N6320 Unspecified lump in the left breast, unspecified quadrant: Secondary | ICD-10-CM

## 2022-04-26 ENCOUNTER — Ambulatory Visit (HOSPITAL_BASED_OUTPATIENT_CLINIC_OR_DEPARTMENT_OTHER): Payer: Federal, State, Local not specified - PPO

## 2022-04-26 ENCOUNTER — Ambulatory Visit (INDEPENDENT_AMBULATORY_CARE_PROVIDER_SITE_OTHER): Payer: Federal, State, Local not specified - PPO | Admitting: Acute Care

## 2022-04-26 ENCOUNTER — Encounter: Payer: Self-pay | Admitting: Acute Care

## 2022-04-26 DIAGNOSIS — F17210 Nicotine dependence, cigarettes, uncomplicated: Secondary | ICD-10-CM | POA: Diagnosis not present

## 2022-04-26 NOTE — Patient Instructions (Signed)
Thank you for participating in the [facility]. It was our pleasure to meet you today. We will call you with the results of your scan within the next few days. Your scan will be assigned a Lung RADS category score by the physicians reading the scans.  This Lung RADS score determines follow up scanning.  See below for description of categories, and follow up screening recommendations. We will be in touch to schedule your follow up screening annually or based on recommendations of our providers. We will fax a copy of your scan results to your Primary Care Physician, or the physician who referred you to the program, to ensure they have the results. Please call the office if you have any questions or concerns regarding your scanning experience or results.  Our office number is [phone]. Please speak with Denise Phelps, RN. , or  Denise Buckner RN, They are  our Lung Cancer Screening RN.'s If They are unavailable when you call, Please leave a message on the voice mail. We will return your call at our earliest convenience.This voice mail is monitored several times a day.  Remember, if your scan is normal, we will scan you annually as long as you continue to meet the criteria for the program. (Age 55-77, Current smoker or smoker who has quit within the last 15 years). If you are a smoker, remember, quitting is the single most powerful action that you can take to decrease your risk of lung cancer and other pulmonary, breathing related problems. We know quitting is hard, and we are here to help.  Please let us know if there is anything we can do to help you meet your goal of quitting. If you are a former smoker, congratulations. We are proud of you! Remain smoke free! Remember you can refer friends or family members through the number above.  We will screen them to make sure they meet criteria for the program. Thank you for helping us take better care of you by  participating in Lung Screening.  You can receive free nicotine replacement therapy ( patches, gum or mints) by calling 1-800-QUIT NOW. Please call so we can get you on the path to becoming  a non-smoker. I know it is hard, but you can do this!  Lung RADS Categories:  Lung RADS 1: no nodules or definitely non-concerning nodules.  Recommendation is for a repeat annual scan in 12 months.  Lung RADS 2:  nodules that are non-concerning in appearance and behavior with a very low likelihood of becoming an active cancer. Recommendation is for a repeat annual scan in 12 months.  Lung RADS 3: nodules that are probably non-concerning , includes nodules with a low likelihood of becoming an active cancer.  Recommendation is for a 6-month repeat screening scan. Often noted after an upper respiratory illness. We will be in touch to make sure you have no questions, and to schedule your 6-month scan.  Lung RADS 4 A: nodules with concerning findings, recommendation is most often for a follow up scan in 3 months or additional testing based on our provider's assessment of the scan. We will be in touch to make sure you have no questions and to schedule the recommended 3 month follow up scan.  Lung RADS 4 B:  indicates findings that are concerning. We will be in touch with you to schedule additional diagnostic testing based on our provider's  assessment of the scan.  Other options for assistance in smoking cessation (   As covered by your insurance benefits)  Hypnosis for smoking cessation  [employer] [phone]  Acupuncture for smoking cessation  [facility] [phone]   

## 2022-04-26 NOTE — Progress Notes (Signed)
Virtual Visit via Telephone Note  I connected with DAFFNEY GREENLY on 04/26/22 at 12:00 PM EDT by telephone and verified that I am speaking with the correct person using two identifiers.  Location: Patient:  At home Provider:  [address], [place], [place], [address]    I discussed the limitations, risks, security and privacy concerns of performing an evaluation and management service by telephone and the availability of in person appointments. I also discussed with the patient that there may be a patient responsible charge related to this service. The patient expressed understanding and agreed to proceed.   Shared Decision Making Visit Lung Cancer Screening Program [phone])   Eligibility: Age 71 y.o. Pack Years Smoking History Calculation 70 pack year smoking history (# packs/per year x # years smoked) Recent History of coughing up blood  no Unexplained weight loss? no ( >Than 15 pounds within the last 6 months ) Prior History Lung / other cancer no (Diagnosis within the last 5 years already requiring surveillance chest CT Scans). Smoking Status Current Smoker Former Smokers: Years since quit:  NA  Quit Date:  NA  Visit Components: Discussion included one or more decision making aids. yes Discussion included risk/benefits of screening. yes Discussion included potential follow up diagnostic testing for abnormal scans. yes Discussion included meaning and risk of over diagnosis. yes Discussion included meaning and risk of False Positives. yes Discussion included meaning of total radiation exposure. yes  Counseling Included: Importance of adherence to annual lung cancer LDCT screening. yes Impact of comorbidities on ability to participate in the program. yes Ability and willingness to under diagnostic treatment. yes  Smoking Cessation Counseling: Current Smokers:  Discussed importance of smoking cessation. yes Information about tobacco cessation classes and  interventions provided to patient. yes Patient provided with "ticket" for LDCT Scan. yes Symptomatic Patient. no  Counseling NA Diagnosis Code: Tobacco Use Z72.0 Asymptomatic Patient yes  Counseling (Intermediate counseling: > three minutes counseling) U0454 Former Smokers:  Discussed the importance of maintaining cigarette abstinence. yes Diagnosis Code: Personal History of Nicotine Dependence. U98.119 Information about tobacco cessation classes and interventions provided to patient. Yes Patient provided with "ticket" for LDCT Scan. yes Written Order for Lung Cancer Screening with LDCT placed in Epic. Yes (CT Chest Lung Cancer Screening Low Dose W/O CM) [id number] Z12.2-Screening of respiratory organs Z87.891-Personal history of nicotine dependence  I have spent 25 minutes of face to face/ virtual visit   time with Ms. Wah discussing the risks and benefits of lung cancer screening. We viewed / discussed a power point together that explained in detail the above noted topics. We paused at intervals to allow for questions to be asked and answered to ensure understanding.We discussed that the single most powerful action that she can take to decrease her risk of developing lung cancer is to quit smoking. We discussed whether or not she is ready to commit to setting a quit date. We discussed options for tools to aid in quitting smoking including nicotine replacement therapy, non-nicotine medications, support groups, Quit Smart classes, and behavior modification. We discussed that often times setting smaller, more achievable goals, such as eliminating 1 cigarette a day for a week and then 2 cigarettes a day for a week can be helpful in slowly decreasing the number of cigarettes smoked. This allows for a sense of accomplishment as well as providing a clinical benefit. I provided  her  with smoking cessation  information  with contact information for community resources, classes, free  nicotine replacement  therapy, and access to mobile apps, text messaging, and on-line smoking cessation help. I have also provided  her  the office contact information in the event she needs to contact me, or the screening staff. We discussed the time and location of the scan, and that either Abigail Miyamoto RN, Karlton Lemon, RN  or I will call / send a letter with the results within 24-72 hours of receiving them. The patient verbalized understanding of all of  the above and had no further questions upon leaving the office. They have my contact information in the event they have any further questions.  I spent 3 minutes counseling on smoking cessation and the health risks of continued tobacco abuse.  I explained to the patient that there has been a high incidence of coronary artery disease noted on these exams. I explained that this is a non-gated exam therefore degree or severity cannot be determined. This patient is on statin therapy. I have asked the patient to follow-up with their PCP regarding any incidental finding of coronary artery disease and management with diet or medication as their PCP  feels is clinically indicated. The patient verbalized understanding of the above and had no further questions upon completion of the visit.      Bevelyn Ngo, NP 04/26/2022

## 2022-04-29 ENCOUNTER — Ambulatory Visit (HOSPITAL_BASED_OUTPATIENT_CLINIC_OR_DEPARTMENT_OTHER)
Admission: RE | Admit: 2022-04-29 | Discharge: 2022-04-29 | Disposition: A | Discharge status: Home / Self Care | Payer: Medicare Other | Source: Ambulatory Visit | Attending: Acute Care | Admitting: Acute Care

## 2022-04-29 DIAGNOSIS — Z87891 Personal history of nicotine dependence: Secondary | ICD-10-CM | POA: Insufficient documentation

## 2022-04-29 DIAGNOSIS — F17210 Nicotine dependence, cigarettes, uncomplicated: Secondary | ICD-10-CM | POA: Diagnosis present

## 2022-04-29 DIAGNOSIS — J432 Centrilobular emphysema: Secondary | ICD-10-CM | POA: Insufficient documentation

## 2022-04-29 DIAGNOSIS — Z122 Encounter for screening for malignant neoplasm of respiratory organs: Secondary | ICD-10-CM | POA: Diagnosis present

## 2022-04-29 DIAGNOSIS — I2510 Atherosclerotic heart disease of native coronary artery without angina pectoris: Secondary | ICD-10-CM | POA: Diagnosis not present

## 2022-04-29 DIAGNOSIS — I700 Atherosclerosis of aorta: Secondary | ICD-10-CM | POA: Diagnosis not present

## 2022-04-29 DIAGNOSIS — R911 Solitary pulmonary nodule: Secondary | ICD-10-CM | POA: Diagnosis not present

## 2022-05-02 ENCOUNTER — Other Ambulatory Visit: Payer: Self-pay

## 2022-05-02 DIAGNOSIS — Z122 Encounter for screening for malignant neoplasm of respiratory organs: Secondary | ICD-10-CM

## 2022-05-02 DIAGNOSIS — Z87891 Personal history of nicotine dependence: Secondary | ICD-10-CM

## 2022-05-02 DIAGNOSIS — F17210 Nicotine dependence, cigarettes, uncomplicated: Secondary | ICD-10-CM

## 2022-08-23 ENCOUNTER — Other Ambulatory Visit: Payer: Self-pay | Admitting: Family Medicine

## 2022-08-23 ENCOUNTER — Encounter: Payer: Self-pay | Admitting: Family Medicine

## 2022-08-23 DIAGNOSIS — R140 Abdominal distension (gaseous): Secondary | ICD-10-CM

## 2022-09-26 ENCOUNTER — Ambulatory Visit
Admission: RE | Admit: 2022-09-26 | Discharge: 2022-09-26 | Disposition: A | Discharge status: Home / Self Care | Payer: Medicare Other | Source: Ambulatory Visit | Attending: Family Medicine | Admitting: Family Medicine

## 2022-09-26 DIAGNOSIS — R140 Abdominal distension (gaseous): Secondary | ICD-10-CM

## 2022-09-26 MED ORDER — IOPAMIDOL (ISOVUE-300) INJECTION 61%
100.0000 mL | Freq: Once | INTRAVENOUS | Status: AC | PRN
  Administered 2022-09-26: 100 mL via INTRAVENOUS

## 2022-09-28 IMAGING — MG MM DIGITAL SCREENING BILAT W/ TOMO AND CAD
8 series · 9 of 24 positions shown · non-contrast
Comparison: Previous exam(s).

CLINICAL DATA: Screening.

EXAM:
DIGITAL SCREENING BILATERAL MAMMOGRAM WITH TOMOSYNTHESIS AND CAD
TECHNIQUE: Bilateral screening digital craniocaudal and mediolateral oblique
mammograms were obtained. Bilateral screening digital breast
tomosynthesis was performed. The images were evaluated with
computer-aided detection.

[R MLO synth-2D]
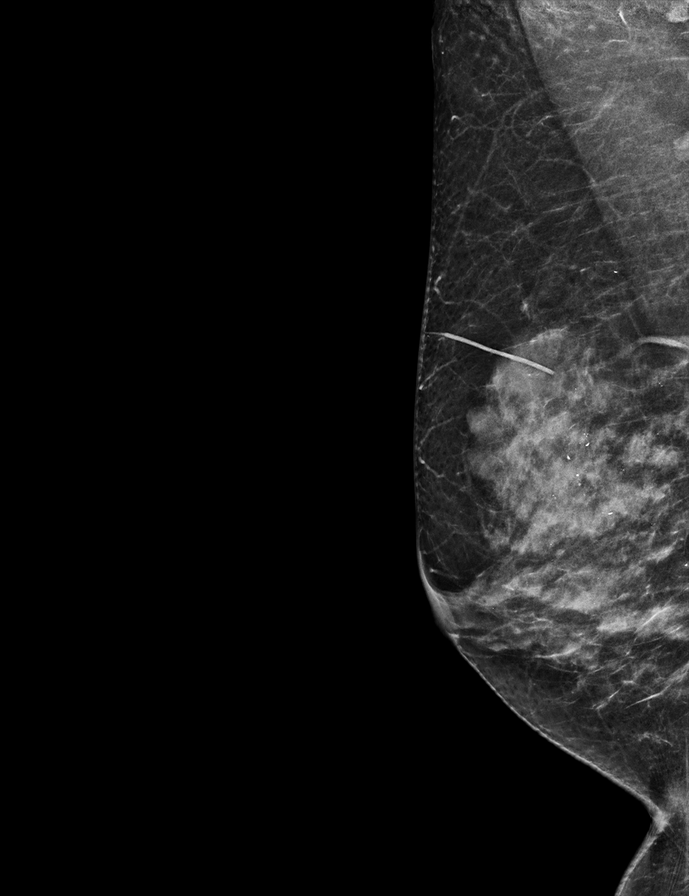

[L MLO synth-2D]
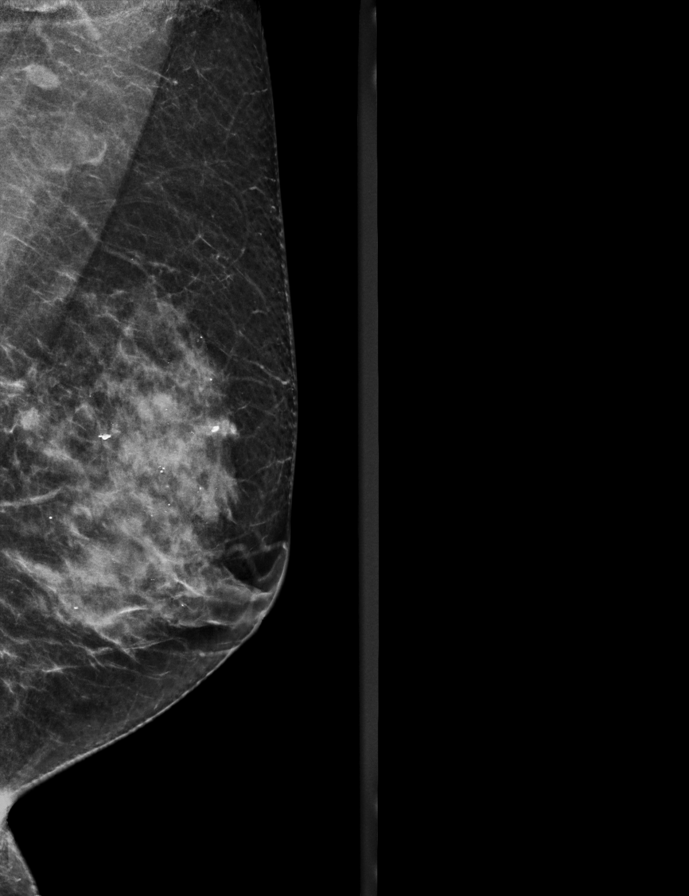

[L CC synth-2D]
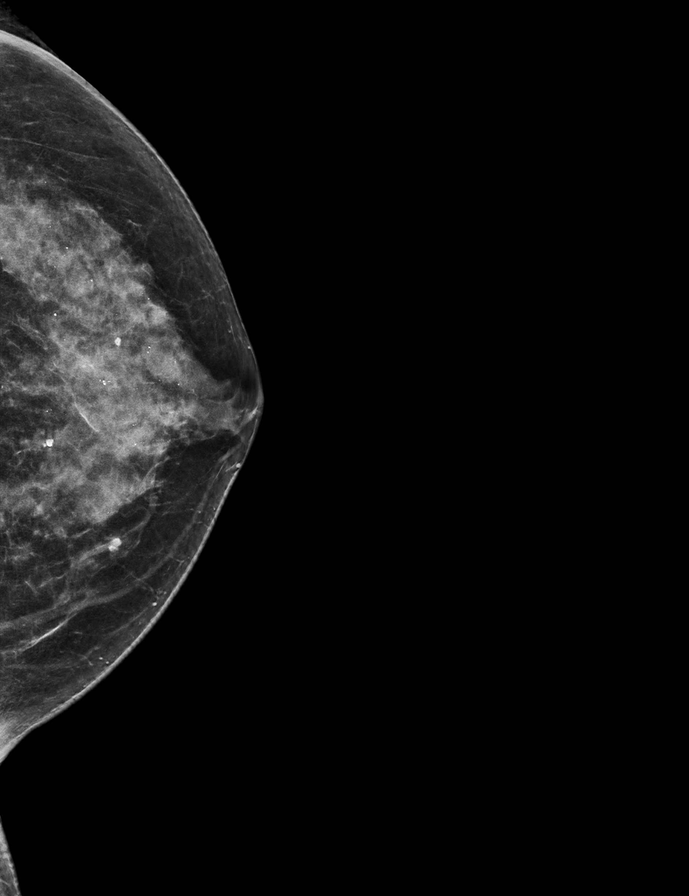

[R CC synth-2D]
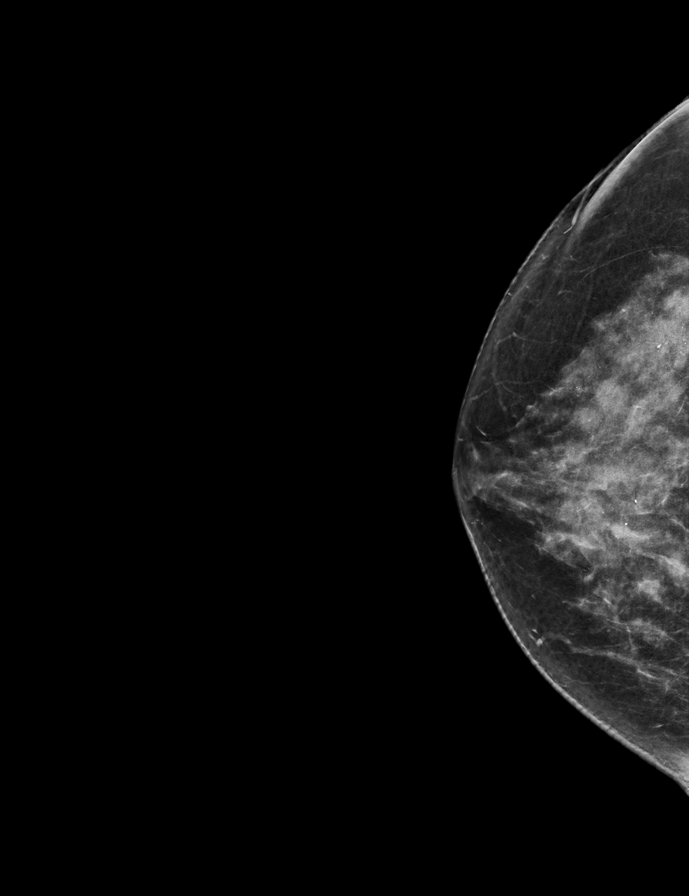

[R MLO tomo · 2 of 55 frames shown]
[frame 18/55]
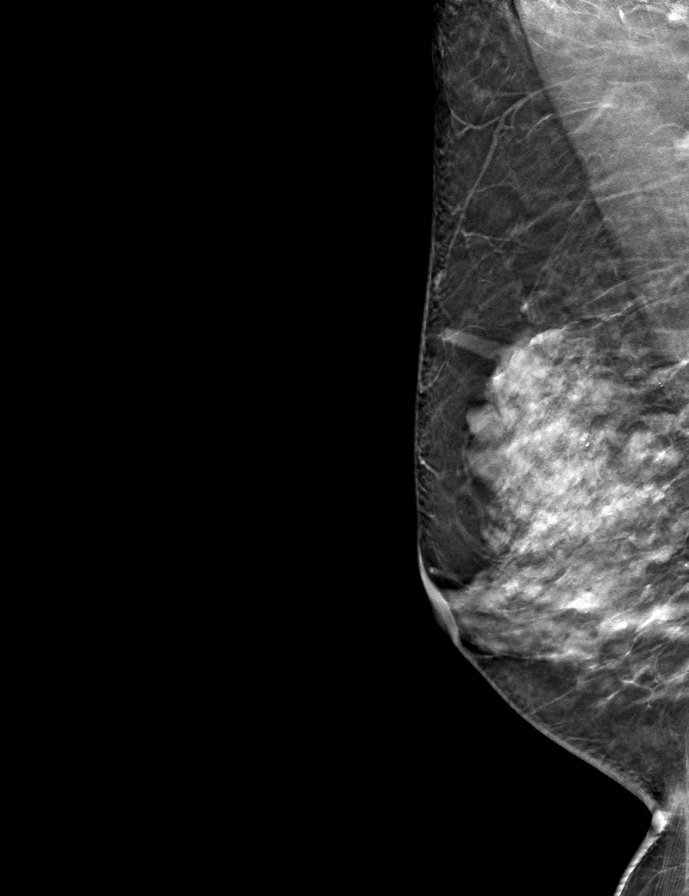
[frame 28/55]
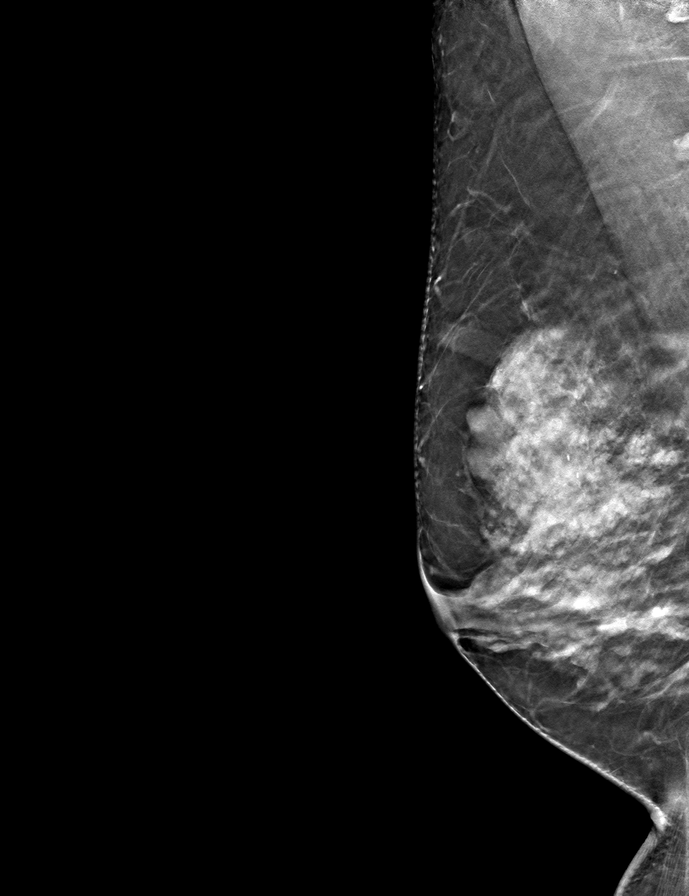

[L CC tomo · tomo slice 31/60.0]
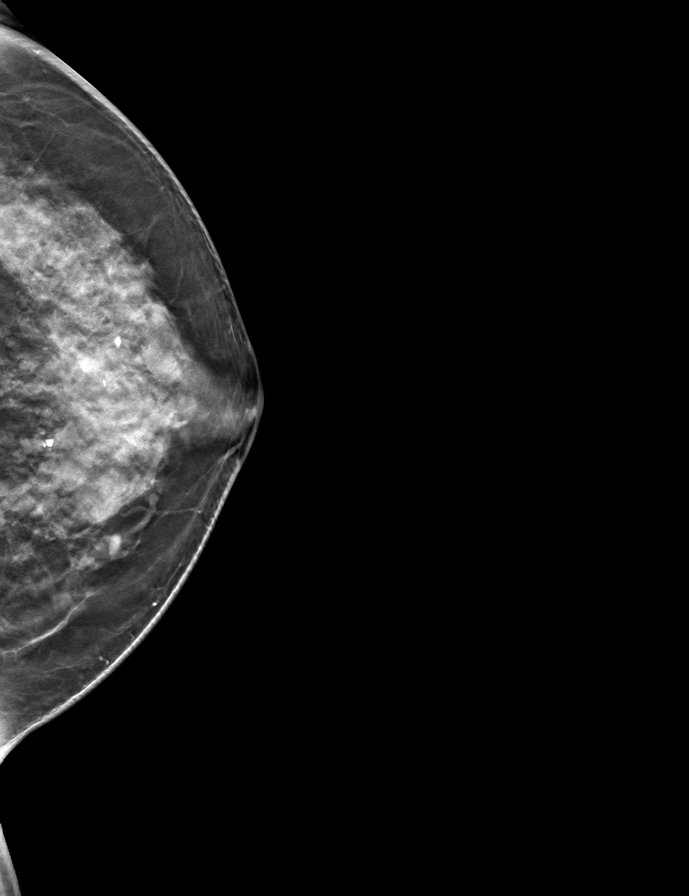

[R CC tomo · tomo slice 32/63.0]
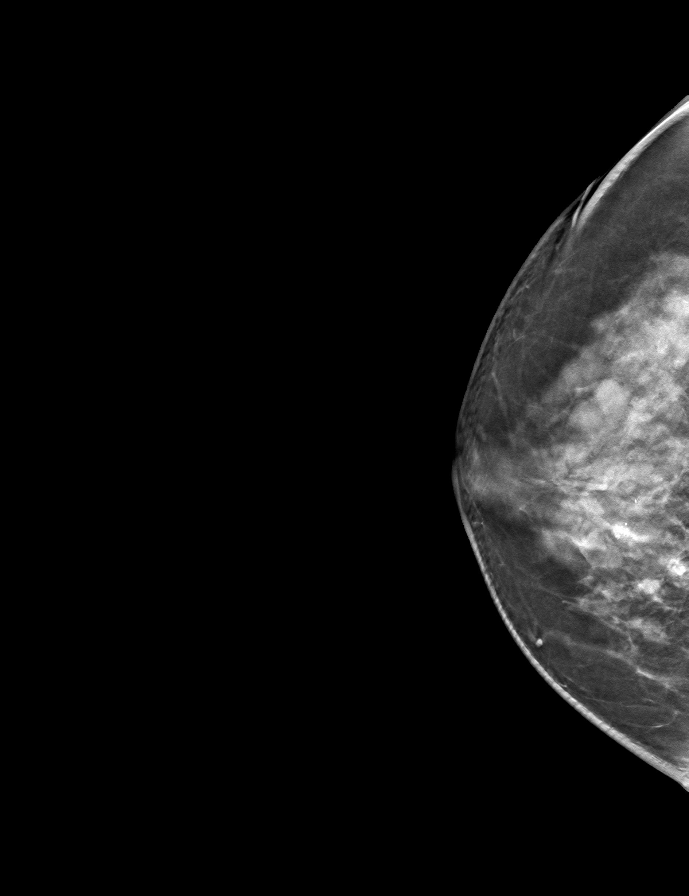

[L MLO tomo · tomo slice 29/58.0]
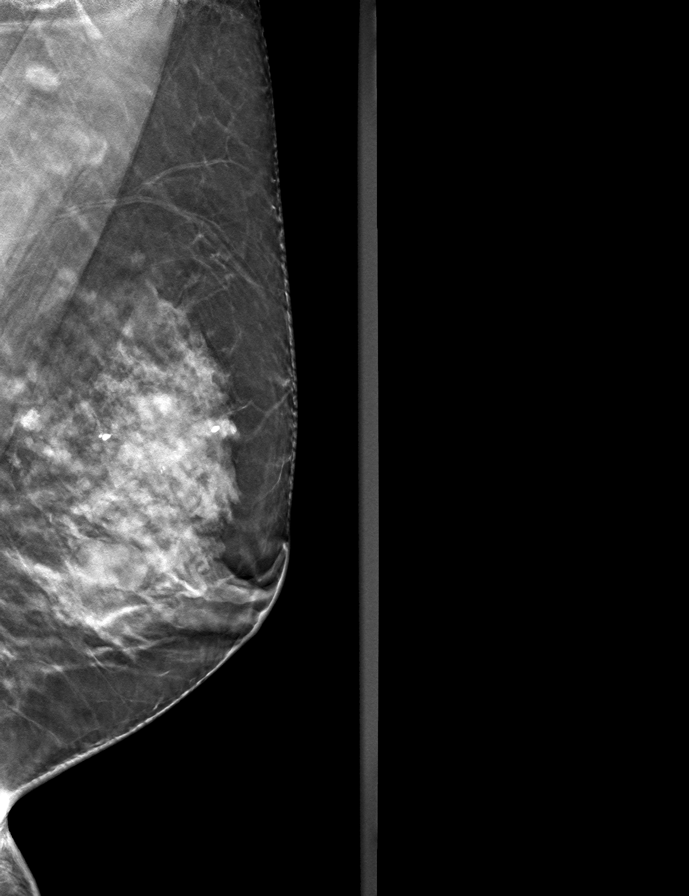

[9 of 24 positions shown; findings below may reference images not displayed]

ACR Breast Density Category d: The breast tissue is extremely dense,
which lowers the sensitivity of mammography.
FINDINGS: In the right breast , calcifications requires further evaluation.

In the left breast , possible asymmetry requires further evaluation.
IMPRESSION: Further evaluation is suggested for calcifications in the right
breast.

Further evaluation is suggested for possible asymmetry in the left
breast.

RECOMMENDATION:
Diagnostic mammogram and possibly ultrasound of both breasts.
(Code:[id number]-[id number])

The patient will be contacted regarding the findings, and additional
imaging will be scheduled.

BI-RADS CATEGORY  0: Incomplete. Need additional imaging evaluation
and/or prior mammograms for comparison.

## 2022-10-13 ENCOUNTER — Ambulatory Visit
Admission: RE | Admit: 2022-10-13 | Discharge: 2022-10-13 | Disposition: A | Discharge status: Home / Self Care | Payer: Medicare Other | Source: Ambulatory Visit | Attending: Family Medicine | Admitting: Family Medicine

## 2022-10-13 DIAGNOSIS — N6320 Unspecified lump in the left breast, unspecified quadrant: Secondary | ICD-10-CM

## 2022-10-20 ENCOUNTER — Other Ambulatory Visit: Payer: Self-pay | Admitting: Family Medicine

## 2022-10-20 DIAGNOSIS — N6320 Unspecified lump in the left breast, unspecified quadrant: Secondary | ICD-10-CM

## 2023-04-16 ENCOUNTER — Ambulatory Visit
Admission: RE | Admit: 2023-04-16 | Discharge: 2023-04-16 | Disposition: A | Discharge status: Home / Self Care | Payer: Medicare Other | Source: Ambulatory Visit | Attending: Family Medicine | Admitting: Family Medicine

## 2023-04-16 ENCOUNTER — Other Ambulatory Visit: Payer: Self-pay | Admitting: Family Medicine

## 2023-04-16 DIAGNOSIS — N6452 Nipple discharge: Secondary | ICD-10-CM

## 2023-04-16 DIAGNOSIS — N6320 Unspecified lump in the left breast, unspecified quadrant: Secondary | ICD-10-CM

## 2023-05-01 ENCOUNTER — Ambulatory Visit (HOSPITAL_BASED_OUTPATIENT_CLINIC_OR_DEPARTMENT_OTHER)
Admission: RE | Admit: 2023-05-01 | Discharge: 2023-05-01 | Disposition: A | Discharge status: Home / Self Care | Payer: Medicare Other | Source: Ambulatory Visit | Attending: Family Medicine | Admitting: Family Medicine

## 2023-05-01 DIAGNOSIS — Z87891 Personal history of nicotine dependence: Secondary | ICD-10-CM | POA: Diagnosis present

## 2023-05-01 DIAGNOSIS — Z122 Encounter for screening for malignant neoplasm of respiratory organs: Secondary | ICD-10-CM | POA: Insufficient documentation

## 2023-05-01 DIAGNOSIS — F17210 Nicotine dependence, cigarettes, uncomplicated: Secondary | ICD-10-CM | POA: Diagnosis not present

## 2023-05-03 ENCOUNTER — Other Ambulatory Visit (HOSPITAL_COMMUNITY): Payer: Self-pay | Admitting: Family Medicine

## 2023-05-03 DIAGNOSIS — N6452 Nipple discharge: Secondary | ICD-10-CM

## 2023-05-05 ENCOUNTER — Ambulatory Visit (HOSPITAL_COMMUNITY)
Admission: RE | Admit: 2023-05-05 | Discharge: 2023-05-05 | Disposition: A | Discharge status: Home / Self Care | Payer: Medicare Other | Source: Ambulatory Visit | Attending: Family Medicine | Admitting: Family Medicine

## 2023-05-05 DIAGNOSIS — N6452 Nipple discharge: Secondary | ICD-10-CM | POA: Insufficient documentation

## 2023-05-05 MED ORDER — GADOBUTROL 1 MMOL/ML IV SOLN
6.5000 mL | Freq: Once | INTRAVENOUS | Status: AC | PRN
  Administered 2023-05-05: 6.5 mL via INTRAVENOUS

## 2023-05-07 ENCOUNTER — Other Ambulatory Visit: Payer: Self-pay

## 2023-05-07 ENCOUNTER — Telehealth: Payer: Self-pay | Admitting: Acute Care

## 2023-05-07 DIAGNOSIS — R911 Solitary pulmonary nodule: Secondary | ICD-10-CM

## 2023-05-07 DIAGNOSIS — F17210 Nicotine dependence, cigarettes, uncomplicated: Secondary | ICD-10-CM

## 2023-05-07 NOTE — Telephone Encounter (Signed)
Spoke with patient regarding LDCT results.  Emphysema and atherosclerosis, as previously noted.  New nodule right lung with recommendation to repeat in 6 months, as precaution. Likely benign. Patient has noted increased chest congestion lately and has needed to 'take more deep breaths.'  No signs of fever, fatigue or other concerns.  Plans to call Dr. Tenny Craw today to see if he can offer medication recommendations. She thinks a nebulizer would help her loosen secretions.  She has a rescue inhaler but doesn't use it.  Patient will also discuss possible pulmonary referral with PCP, as she says her mother had 'bad COPD' also. Patient agrees with repeat LDCT for 6 months. Order has been placed and PCP routed results/plan.

## 2023-07-03 ENCOUNTER — Other Ambulatory Visit: Payer: Self-pay

## 2023-07-03 DIAGNOSIS — Z79899 Other long term (current) drug therapy: Secondary | ICD-10-CM | POA: Insufficient documentation

## 2023-07-03 DIAGNOSIS — Z860100 Personal history of colon polyps, unspecified: Secondary | ICD-10-CM | POA: Insufficient documentation

## 2023-07-03 DIAGNOSIS — R9389 Abnormal findings on diagnostic imaging of other specified body structures: Secondary | ICD-10-CM | POA: Insufficient documentation

## 2023-07-03 DIAGNOSIS — I700 Atherosclerosis of aorta: Secondary | ICD-10-CM | POA: Insufficient documentation

## 2023-07-03 DIAGNOSIS — N3010 Interstitial cystitis (chronic) without hematuria: Secondary | ICD-10-CM | POA: Insufficient documentation

## 2023-07-03 DIAGNOSIS — N3946 Mixed incontinence: Secondary | ICD-10-CM | POA: Insufficient documentation

## 2023-07-03 DIAGNOSIS — F17200 Nicotine dependence, unspecified, uncomplicated: Secondary | ICD-10-CM | POA: Insufficient documentation

## 2023-07-03 DIAGNOSIS — R519 Headache, unspecified: Secondary | ICD-10-CM | POA: Insufficient documentation

## 2023-07-03 DIAGNOSIS — F419 Anxiety disorder, unspecified: Secondary | ICD-10-CM | POA: Insufficient documentation

## 2023-07-03 DIAGNOSIS — R159 Full incontinence of feces: Secondary | ICD-10-CM | POA: Insufficient documentation

## 2023-07-03 DIAGNOSIS — M810 Age-related osteoporosis without current pathological fracture: Secondary | ICD-10-CM | POA: Insufficient documentation

## 2023-07-03 DIAGNOSIS — F4321 Adjustment disorder with depressed mood: Secondary | ICD-10-CM | POA: Insufficient documentation

## 2023-07-03 DIAGNOSIS — Q839 Congenital malformation of breast, unspecified: Secondary | ICD-10-CM | POA: Insufficient documentation

## 2023-07-03 DIAGNOSIS — G479 Sleep disorder, unspecified: Secondary | ICD-10-CM | POA: Insufficient documentation

## 2023-07-03 DIAGNOSIS — K589 Irritable bowel syndrome without diarrhea: Secondary | ICD-10-CM | POA: Insufficient documentation

## 2023-07-03 DIAGNOSIS — E7800 Pure hypercholesterolemia, unspecified: Secondary | ICD-10-CM | POA: Insufficient documentation

## 2023-07-03 DIAGNOSIS — N951 Menopausal and female climacteric states: Secondary | ICD-10-CM | POA: Insufficient documentation

## 2023-07-03 DIAGNOSIS — E559 Vitamin D deficiency, unspecified: Secondary | ICD-10-CM | POA: Insufficient documentation

## 2023-07-03 DIAGNOSIS — J309 Allergic rhinitis, unspecified: Secondary | ICD-10-CM | POA: Insufficient documentation

## 2023-07-03 DIAGNOSIS — R140 Abdominal distension (gaseous): Secondary | ICD-10-CM | POA: Insufficient documentation

## 2023-07-03 DIAGNOSIS — J449 Chronic obstructive pulmonary disease, unspecified: Secondary | ICD-10-CM | POA: Insufficient documentation

## 2023-07-03 DIAGNOSIS — K219 Gastro-esophageal reflux disease without esophagitis: Secondary | ICD-10-CM | POA: Insufficient documentation

## 2023-07-03 DIAGNOSIS — F321 Major depressive disorder, single episode, moderate: Secondary | ICD-10-CM | POA: Insufficient documentation

## 2023-07-03 DIAGNOSIS — Z6828 Body mass index (BMI) 28.0-28.9, adult: Secondary | ICD-10-CM | POA: Insufficient documentation

## 2023-07-03 DIAGNOSIS — N200 Calculus of kidney: Secondary | ICD-10-CM | POA: Insufficient documentation

## 2023-07-04 ENCOUNTER — Encounter: Payer: Self-pay | Admitting: Cardiology

## 2023-07-04 ENCOUNTER — Ambulatory Visit: Payer: Medicare Other | Attending: Cardiology | Admitting: Cardiology

## 2023-07-04 VITALS — BP 122/76 | HR 86 | Ht 61.6 in | Wt 149.1 lb

## 2023-07-04 DIAGNOSIS — I25119 Atherosclerotic heart disease of native coronary artery with unspecified angina pectoris: Secondary | ICD-10-CM | POA: Diagnosis not present

## 2023-07-04 DIAGNOSIS — I700 Atherosclerosis of aorta: Secondary | ICD-10-CM

## 2023-07-04 DIAGNOSIS — I259 Chronic ischemic heart disease, unspecified: Secondary | ICD-10-CM

## 2023-07-04 DIAGNOSIS — R9431 Abnormal electrocardiogram [ECG] [EKG]: Secondary | ICD-10-CM

## 2023-07-04 DIAGNOSIS — R0609 Other forms of dyspnea: Secondary | ICD-10-CM | POA: Diagnosis present

## 2023-07-04 DIAGNOSIS — Z720 Tobacco use: Secondary | ICD-10-CM | POA: Diagnosis present

## 2023-07-04 DIAGNOSIS — I2510 Atherosclerotic heart disease of native coronary artery without angina pectoris: Secondary | ICD-10-CM | POA: Insufficient documentation

## 2023-07-04 DIAGNOSIS — I209 Angina pectoris, unspecified: Secondary | ICD-10-CM | POA: Insufficient documentation

## 2023-07-04 DIAGNOSIS — E7800 Pure hypercholesterolemia, unspecified: Secondary | ICD-10-CM | POA: Diagnosis present

## 2023-07-04 MED ORDER — NITROGLYCERIN 0.4 MG SL SUBL: 0.4000 mg | SUBLINGUAL_TABLET | SUBLINGUAL | 6 refills | Status: DC | PRN

## 2023-07-04 MED ORDER — ATORVASTATIN CALCIUM 20 MG PO TABS: 20.0000 mg | ORAL_TABLET | Freq: Every day | ORAL | 3 refills | Status: DC

## 2023-07-04 NOTE — Patient Instructions (Signed)
Medication Instructions:  Your physician has recommended you make the following change in your medication:   Increase your Atorvastatin to 20 mg daily.  Use nitroglycerin 1 tablet placed under the tongue at the first sign of chest pain or an angina attack. 1 tablet may be used every 5 minutes as needed, for up to 15 minutes. Do not take more than 3 tablets in 15 minutes. If pain persist call 911 or go to the nearest ED.   *If you need a refill on your cardiac medications before your next appointment, please call your pharmacy*   Lab Work: Your physician recommends that you return for lab work in: the next few days for CMP and 6 weeks for fasting Lipids and CMP. [facility] lab is located on the 3rd floor, [address]. Hours are Monday - Friday 8 am to 4 pm, closed 11:30 am to 1:00 pm. You do NOT need an appointment.    If you have labs (blood work) drawn today and your tests are completely normal, you will receive your results only by: MyChart Message (if you have MyChart) OR A paper copy in the mail If you have any lab test that is abnormal or we need to change your treatment, we will call you to review the results.   Testing/Procedures:   Your cardiac CT will be scheduled at one of the below locations:   [facility] [address] [place], [place] [address] [phone]  If scheduled at [facility], please arrive at the [facility] Entrance (Entrance C2) of [facility] 30 minutes prior to test start time. You can use the FREE valet parking offered at entrance C (encouraged to control the heart rate for the test)  Proceed to the [facility] Radiology Department (first floor) to check-in and test prep.  All radiology patients and guests should use entrance C2 at [facility], accessed from [facility], even though the hospital's physical address listed is [address].     Please follow these instructions  carefully (unless otherwise directed):  On the Night Before the Test: Be sure to Drink plenty of water. Do not consume any caffeinated/decaffeinated beverages or chocolate 12 hours prior to your test. Do not take any antihistamines 12 hours prior to your test.  On the Day of the Test: Drink plenty of water until 1 hour prior to the test. Do not eat any food 1 hour prior to test. You may take your regular medications prior to the test.  Take metoprolol (Lopressor) two hours prior to test. This will be a one time dose. FEMALES- please wear underwire-free bra if available, avoid dresses & tight clothing      After the Test: Drink plenty of water. After receiving IV contrast, you may experience a mild flushed feeling. This is normal. On occasion, you may experience a mild rash up to 24 hours after the test. This is not dangerous. If this occurs, you can take Benadryl 25 mg and increase your fluid intake. If you experience trouble breathing, this can be serious. If it is severe call 911 IMMEDIATELY. If it is mild, please call our office. If you take any of these medications: Glipizide/Metformin, Avandament, Glucavance, please do not take 48 hours after completing test unless otherwise instructed.  We will call to schedule your test 2-4 weeks out understanding that some insurance companies will need an authorization prior to the service being performed.   For non-scheduling related questions,  please contact the cardiac imaging nurse navigator should you have any questions/concerns: Rockwell Alexandria, Cardiac Imaging Nurse Navigator Larey Brick, Cardiac Imaging Nurse Navigator [facility] Direct Office Dial: [phone]   For scheduling needs, including cancellations and rescheduling, please call [place], [phone].   Your physician has requested that you have an echocardiogram. Echocardiography is a painless test that uses sound waves to create images of your  heart. It provides your doctor with information about the size and shape of your heart and how well your heart's chambers and valves are working. This procedure takes approximately one hour. There are no restrictions for this procedure. Please do NOT wear cologne, perfume, aftershave, or lotions (deodorant is allowed). Please arrive 15 minutes prior to your appointment time.  Your next appointment:   3 month(s)  The format for your next appointment:   In Person  Provider:   Belva Crome, MD   Other Instructions Cardiac CT Angiogram A cardiac CT angiogram is a procedure to look at the heart and the area around the heart. It may be done to help find the cause of chest pains or other symptoms of heart disease. During this procedure, a substance called contrast dye is injected into the blood vessels in the area to be checked. A large X-ray machine, called a CT scanner, then takes detailed pictures of the heart and the surrounding area. The procedure is also sometimes called a coronary CT angiogram, coronary artery scanning, or CTA. A cardiac CT angiogram allows the health care provider to see how well blood is flowing to and from the heart. The health care provider will be able to see if there are any problems, such as: Blockage or narrowing of the coronary arteries in the heart. Fluid around the heart. Signs of weakness or disease in the muscles, valves, and tissues of the heart. Tell a health care provider about: Any allergies you have. This is especially important if you have had a previous allergic reaction to contrast dye. All medicines you are taking, including vitamins, herbs, eye drops, creams, and over-the-counter medicines. Any blood disorders you have. Any surgeries you have had. Any medical conditions you have. Whether you are pregnant or may be pregnant. Any anxiety disorders, chronic pain, or other conditions you have that may increase your stress or prevent you from lying  still. What are the risks? Generally, this is a safe procedure. However, problems may occur, including: Bleeding. Infection. Allergic reactions to medicines or dyes. Damage to other structures or organs. Kidney damage from the contrast dye that is used. Increased risk of cancer from radiation exposure. This risk is low. Talk with your health care provider about: The risks and benefits of testing. How you can receive the lowest dose of radiation. What happens before the procedure? Wear comfortable clothing and remove any jewelry, glasses, dentures, and hearing aids. Follow instructions from your health care provider about eating and drinking. This may include: For 12 hours before the procedure -- avoid caffeine. This includes tea, coffee, soda, energy drinks, and diet pills. Drink plenty of water or other fluids that do not have caffeine in them. Being well hydrated can prevent complications. For 4-6 hours before the procedure -- stop eating and drinking. The contrast dye can cause nausea, but this is less likely if your stomach is empty. Ask your health care provider about changing or stopping your regular medicines. This is especially important if you are taking diabetes medicines, blood thinners, or medicines to treat  problems with erections (erectile dysfunction). What happens during the procedure?  Hair on your chest may need to be removed so that small sticky patches called electrodes can be placed on your chest. These will transmit information that helps to monitor your heart during the procedure. An IV will be inserted into one of your veins. You might be given a medicine to control your heart rate during the procedure. This will help to ensure that good images are obtained. You will be asked to lie on an exam table. This table will slide in and out of the CT machine during the procedure. Contrast dye will be injected into the IV. You might feel warm, or you may get a metallic taste in  your mouth. You will be given a medicine called nitroglycerin. This will relax or dilate the arteries in your heart. The table that you are lying on will move into the CT machine tunnel for the scan. The person running the machine will give you instructions while the scans are being done. You may be asked to: Keep your arms above your head. Hold your breath. Stay very still, even if the table is moving. When the scanning is complete, you will be moved out of the machine. The IV will be removed. The procedure may vary among health care providers and hospitals. What can I expect after the procedure? After your procedure, it is common to have: A metallic taste in your mouth from the contrast dye. A feeling of warmth. A headache from the nitroglycerin. Follow these instructions at home: Take over-the-counter and prescription medicines only as told by your health care provider. If you are told, drink enough fluid to keep your urine pale yellow. This will help to flush the contrast dye out of your body. Most people can return to their normal activities right after the procedure. Ask your health care provider what activities are safe for you. It is up to you to get the results of your procedure. Ask your health care provider, or the department that is doing the procedure, when your results will be ready. Keep all follow-up visits as told by your health care provider. This is important. Contact a health care provider if: You have any symptoms of allergy to the contrast dye. These include: Shortness of breath. Rash or hives. A racing heartbeat. Summary A cardiac CT angiogram is a procedure to look at the heart and the area around the heart. It may be done to help find the cause of chest pains or other symptoms of heart disease. During this procedure, a large X-ray machine, called a CT scanner, takes detailed pictures of the heart and the surrounding area after a contrast dye has been injected into  blood vessels in the area. Ask your health care provider about changing or stopping your regular medicines before the procedure. This is especially important if you are taking diabetes medicines, blood thinners, or medicines to treat erectile dysfunction. If you are told, drink enough fluid to keep your urine pale yellow. This will help to flush the contrast dye out of your body. This information is not intended to replace advice given to you by your health care provider. Make sure you discuss any questions you have with your health care provider. Document Revised: 05/28/2019 Document Reviewed: 05/28/2019 Elsevier Patient Education  [employer].  Echocardiogram An echocardiogram is a test that uses sound waves to make images of your heart. This way of making images is often called ultrasound. The images  from this test can help find out many things about your heart, including: The size and shape of your heart. The strength of your heart muscle and how well it's working. The size, thickness, and movement of your heart's walls. How your heart valves are working. Problems such as: A tumor or a growth from an infection around the heart valves. Areas of heart muscle that aren't working well because of poor blood flow or injury from a heart attack. An aneurysm. This is a weak or damaged part of an artery wall. An artery is a blood vessel. Tell a health care provider about: Any allergies you have. All medicines you're taking, including vitamins, herbs, eye drops, creams, and over-the-counter medicines. Any bleeding problems you have. Any surgeries you've had. Any medical problems you have. Whether you're pregnant or may be pregnant. What are the risks? Your health care provider will talk with you about risks. These may include an allergic reaction to IV dye that may be used during the test. What happens before the test? You don't need to do anything to get ready for this test. You may eat  and drink normally. What happens during the test?  You'll take off your clothes from the waist up and put on a hospital gown. Sticky patches called electrodes may be placed on your chest. These will be connected to a machine that monitors your heart rate and rhythm. You'll lie down on a table for the exam. A wand covered in gel will be moved over your chest. Sound waves from the wand will go to your heart and bounce back--or "echo" back. The sound waves will go to a computer that uses them to make images of your heart. The images can be viewed on a monitor. The images will also be recorded on the computer so your provider can look at them later. You may be asked to change positions or hold your breath for a short time. This makes it easier to get different views or better views of your heart. In some cases, you may be given a dye through an IV. The IV is put into one of your veins. This dye can make the areas of your heart easier to see. The procedure may vary among providers and hospitals. What can I expect after the test? You may return to your normal diet, activities, and medicines unless your provider tells you not to. If an IV was placed for the test, it will be removed. It's up to you to get the results of your test. Ask your provider, or the department that's doing the test, when your results will be ready. This information is not intended to replace advice given to you by your health care provider. Make sure you discuss any questions you have with your health care provider. Document Revised: 12/01/2022 Document Reviewed: 12/01/2022 Elsevier Patient Education  2024 [employer].

## 2023-07-04 NOTE — Progress Notes (Signed)
Cardiology Office Note:    Date:  07/04/2023   ID:  Audrey Hall, DOB 1951/02/28, MRN [id number]  PCP:  Daisy Floro, MD  Cardiologist:  Garwin Brothers, MD   Referring MD: Daisy Floro, MD    ASSESSMENT:    1. Nonspecific abnormal electrocardiogram (ECG) (EKG)   2. Aortic atherosclerosis (HCC)   3. Coronary artery calcification seen on CT scan   4. Hypercholesteremia   5. Dyspnea on exertion   6. Angina pectoris (HCC)    PLAN:    In order of problems listed above:  Angina pectoris: Abnormal calcification of the coronary arteries and left main on CT scan done for cancer screening.  I discussed my findings with the patient at length.  She was advised to continue taking a coated aspirin on a daily basis.  Sublingual nitroglycerin prescription was sent, its protocol and 911 protocol explained and the patient vocalized understanding questions were answered to the patient's satisfaction.  CT coronary angiography with FFR was recommended among other tests invasive and noninvasive and she prefers this.  Will set her up for this.  She knows to go to the nearest emergency room for any concerning symptoms. Mixed dyslipidemia: I reviewed lipids from primary care.  Goal LDL must be less than 60.  I will tell the patient to increase Lipitor to 20 mg daily.  Will do a Chem-7 and liver panel as baseline today.  She will be back in 6 weeks for liver lipid check.  Diet emphasized.  Lifestyle modification urged. Cigarette smoker: I spent 5 minutes with the patient discussing solely about smoking. Smoking cessation was counseled. I suggested to the patient also different medications and pharmacological interventions. Patient is keen to try stopping on its own at this time. He will get back to me if he needs any further assistance in this matter. Patient will be seen in follow-up appointment in 6 months or earlier if the patient has any concerns.    Medication Adjustments/Labs and Tests  Ordered: Current medicines are reviewed at length with the patient today.  Concerns regarding medicines are outlined above.  Orders Placed This Encounter  Procedures   EKG 12-Lead   No orders of the defined types were placed in this encounter.    History of Present Illness:    Audrey Hall is a 72 y.o. female who is being seen today for the evaluation of abnormal coronary calcification on CT scan.  At the request of Daisy Floro, MD. patient is a pleasant 72 year old female.  She has past medical history of mixed dyslipidemia and cigarette smoking for very prolonged.  Over time.  She mentions to me that her screening for cancer CT scan revealed significant calcification of the coronary arteries and therefore she was sent here for evaluation.  In fact review of that CT scan reveals plaque formation in the left main.  She gives history of dyspnea on exertion.  She occasionally has chest tightness.  She leads a very sedentary lifestyle.  At the time of my evaluation, the patient is alert awake oriented and in no distress.  Past Medical History:  Diagnosis Date   Abdominal bloating    Abnormal CT scan    Allergic rhinitis    Anxiety    Aortic atherosclerosis (HCC)    BMI 28.0-28.9,adult    BPPV (benign paroxysmal positional vertigo), unspecified laterality 09/12/2017   Breast anomaly    COPD (chronic obstructive pulmonary disease) (HCC)    Full incontinence  of feces    GERD without esophagitis    Grief reaction    Headache    High cholesterol    High risk medication use    Hypercholesteremia    IBS (irritable bowel syndrome)    Interstitial cystitis    Major depressive disorder, single episode, moderate (HCC)    Menopausal hot flushes    Mixed stress and urge urinary incontinence    Nephrolithiasis    Osteoporosis    Personal history of colonic polyps    Postmenopausal disorder    Sensorineural hearing loss (SNHL) of both ears 09/12/2017   Sleep disturbance    Smoker     Tinnitus, bilateral 09/12/2017   Vertigo 09/12/2017   Vitamin D deficiency     Past Surgical History:  Procedure Laterality Date   BREAST CYST ASPIRATION     BREAST EXCISIONAL BIOPSY Right    CHOLECYSTECTOMY     LAPAROSCOPIC APPENDECTOMY N/A 09/25/2020   Procedure: APPENDECTOMY LAPAROSCOPIC;  Surgeon: Emelia Loron, MD;  Location: [facility];  Service: General;  Laterality: N/A;    Current Medications: Current Meds  Medication Sig   albuterol (VENTOLIN HFA) 108 (90 Base) MCG/ACT inhaler Inhale 1-2 puffs into the lungs every 4 (four) hours as needed for wheezing or shortness of breath.   ALPRAZolam (XANAX) 0.5 MG tablet Take 0.5-1 mg by mouth at bedtime as needed for anxiety.   aspirin EC 81 MG tablet Take 81 mg by mouth daily.   atorvastatin (LIPITOR) 10 MG tablet Take 10 mg by mouth daily.   B Complex Vitamins (VITAMIN B COMPLEX) TABS Take 1 tablet by mouth daily.   Cholecalciferol (VITAMIN D3) 5000 UNITS CAPS Take 1 capsule by mouth daily.   estradiol (ESTRACE) 0.1 MG/GM vaginal cream Place 1 Applicatorful vaginally 2 (two) times a week.   fluticasone (FLONASE) 50 MCG/ACT nasal spray Place 1 spray into both nostrils daily.   lansoprazole (PREVACID) 30 MG capsule Take 30 mg by mouth daily.   loratadine (CLARITIN) 10 MG tablet Take 10 mg by mouth daily.   medroxyPROGESTERone (PROVERA) 2.5 MG tablet Take 2.5 mg by mouth daily.   melatonin 1 MG TABS tablet Take 1 mg by mouth at bedtime.     Allergies:   Morphine, Bupropion, Citalopram hydrobromide, and Pantoprazole sodium   Social History   Socioeconomic History   Marital status: Married    Spouse name: Not on file   Number of children: Not on file   Years of education: Not on file   Highest education level: Not on file  Occupational History   Not on file  Tobacco Use   Smoking status: Every Day    Types: Cigarettes   Smokeless tobacco: Never  Substance and Sexual Activity   Alcohol use: No   Drug use: No   Sexual  activity: Yes    Birth control/protection: Post-menopausal  Other Topics Concern   Not on file  Social History Narrative   Not on file   Social Determinants of Health   Financial Resource Strain: Not on file  Food Insecurity: Not on file  Transportation Needs: Not on file  Physical Activity: Not on file  Stress: Not on file  Social Connections: Not on file     Family History: The patient's family history includes Breast cancer in her maternal aunt and mother; Breast cancer (age of onset: 45) in her sister.  ROS:   Please see the history of present illness.    All other systems reviewed and are  negative.  EKGs/Labs/Other Studies Reviewed:    The following studies were reviewed today:  EKG Interpretation Date/Time:  Wednesday July 04 2023 15:39:16 EDT Ventricular Rate:  86 PR Interval:  156 QRS Duration:  66 QT Interval:  366 QTC Calculation: 437 R Axis:   66  Text Interpretation: Normal sinus rhythm Septal infarct , age undetermined When compared with ECG of 24-May-2020 08:08, PREVIOUS ECG IS PRESENT Confirmed by Belva Crome [id number]) on 07/04/2023 4:06:03 PM     Recent Labs: No results found for requested labs within last 365 days.  Recent Lipid Panel No results found for: "CHOL", "TRIG", "HDL", "CHOLHDL", "VLDL", "LDLCALC", "LDLDIRECT"  Physical Exam:    VS:  BP 122/76   Pulse 86   Ht 5' 1.6" (1.565 m)   Wt 149 lb 1.9 oz (67.6 kg)   SpO2 94%   BMI 27.63 kg/m     Wt Readings from Last 3 Encounters:  07/04/23 149 lb 1.9 oz (67.6 kg)  09/25/20 128 lb (58.1 kg)  05/24/20 128 lb (58.1 kg)     GEN: Patient is in no acute distress HEENT: Normal NECK: No JVD; No carotid bruits LYMPHATICS: No lymphadenopathy CARDIAC: S1 S2 regular, 2/6 systolic murmur at the apex. RESPIRATORY:  Clear to auscultation without rales, wheezing or rhonchi  ABDOMEN: Soft, non-tender, non-distended MUSCULOSKELETAL:  No edema; No deformity  SKIN: Warm and dry NEUROLOGIC:   Alert and oriented x 3 PSYCHIATRIC:  Normal affect    Signed, Garwin Brothers, MD  07/04/2023 4:21 PM    [facility]

## 2023-07-06 ENCOUNTER — Encounter (HOSPITAL_COMMUNITY): Payer: Self-pay

## 2023-07-06 MED ORDER — METOPROLOL TARTRATE 100 MG PO TABS
100.0000 mg | ORAL_TABLET | Freq: Once | ORAL | 0 refills | Status: DC
Start: 2023-07-06 — End: 2024-06-24

## 2023-07-06 NOTE — Addendum Note (Signed)
Addended by: Eleonore Chiquito on: 07/06/2023 01:55 PM   Modules accepted: Orders

## 2023-07-07 LAB — COMPREHENSIVE METABOLIC PANEL
ALT: 21 IU/L (ref 0–32)
AST: 27 IU/L (ref 0–40)
Albumin: 4.7 g/dL (ref 3.9–4.9)
Alkaline Phosphatase: 64 IU/L (ref 44–121)
BUN/Creatinine Ratio: 14 (ref 12–28)
BUN: 13 mg/dL (ref 8–27)
Bilirubin Total: 0.6 mg/dL (ref 0.0–1.2)
CO2: 22 mmol/L (ref 20–29)
Calcium: 10 mg/dL (ref 8.7–10.3)
Chloride: 103 mmol/L (ref 96–106)
Creatinine, Ser: 0.91 mg/dL (ref 0.57–1.00)
Globulin, Total: 2.3 g/dL (ref 1.5–4.5)
Glucose: 92 mg/dL (ref 70–99)
Potassium: 4.9 mmol/L (ref 3.5–5.2)
Sodium: 141 mmol/L (ref 134–144)
Total Protein: 7 g/dL (ref 6.0–8.5)
eGFR: 68 mL/min/{1.73_m2} (ref >59)

## 2023-07-11 ENCOUNTER — Ambulatory Visit (HOSPITAL_COMMUNITY)
Admission: RE | Admit: 2023-07-11 | Discharge: 2023-07-11 | Disposition: A | Discharge status: Home / Self Care | Payer: Medicare Other | Source: Ambulatory Visit | Attending: Cardiology | Admitting: Cardiology

## 2023-07-11 DIAGNOSIS — I209 Angina pectoris, unspecified: Secondary | ICD-10-CM | POA: Diagnosis present

## 2023-07-11 DIAGNOSIS — I259 Chronic ischemic heart disease, unspecified: Secondary | ICD-10-CM

## 2023-07-11 DIAGNOSIS — R0609 Other forms of dyspnea: Secondary | ICD-10-CM | POA: Diagnosis present

## 2023-07-11 MED ORDER — IOHEXOL 350 MG/ML SOLN
95.0000 mL | Freq: Once | INTRAVENOUS | Status: AC | PRN
  Administered 2023-07-11: 95 mL via INTRAVENOUS

## 2023-07-11 MED ORDER — NITROGLYCERIN 0.4 MG SL SUBL
0.8000 mg | SUBLINGUAL_TABLET | Freq: Once | SUBLINGUAL | Status: AC
  Administered 2023-07-11: 0.8 mg via SUBLINGUAL

## 2023-07-11 MED ORDER — NITROGLYCERIN 0.4 MG SL SUBL
SUBLINGUAL_TABLET | SUBLINGUAL | Status: AC
  Filled 2023-07-11: qty 2

## 2023-07-13 ENCOUNTER — Telehealth: Payer: Self-pay

## 2023-07-13 DIAGNOSIS — E7800 Pure hypercholesterolemia, unspecified: Secondary | ICD-10-CM

## 2023-07-13 DIAGNOSIS — I2510 Atherosclerotic heart disease of native coronary artery without angina pectoris: Secondary | ICD-10-CM

## 2023-07-13 NOTE — Telephone Encounter (Signed)
My Chart message sent

## 2023-07-13 NOTE — Telephone Encounter (Signed)
-----   Message from Garwin Brothers sent at 07/13/2023  4:08 PM EDT ----- Patient has a PFO.  Medical management.  Coronary artery disease is mild.  Please bring patient in for a Chem-7 liver lipid check in the next few days.  Diet and excise.  Copy primary care Garwin Brothers, MD 07/13/2023 4:08 PM

## 2023-07-18 ENCOUNTER — Telehealth: Payer: Self-pay

## 2023-07-18 DIAGNOSIS — I2510 Atherosclerotic heart disease of native coronary artery without angina pectoris: Secondary | ICD-10-CM

## 2023-07-18 DIAGNOSIS — E7800 Pure hypercholesterolemia, unspecified: Secondary | ICD-10-CM

## 2023-07-18 LAB — COMPREHENSIVE METABOLIC PANEL
ALT: 23 [IU]/L (ref 0–32)
AST: 31 [IU]/L (ref 0–40)
Albumin: 4.6 g/dL (ref 3.9–4.9)
Alkaline Phosphatase: 66 [IU]/L (ref 44–121)
BUN/Creatinine Ratio: 11 — ABNORMAL LOW (ref 12–28)
BUN: 9 mg/dL (ref 8–27)
Bilirubin Total: 0.5 mg/dL (ref 0.0–1.2)
CO2: 21 mmol/L (ref 20–29)
Calcium: 9.7 mg/dL (ref 8.7–10.3)
Chloride: 105 mmol/L (ref 96–106)
Creatinine, Ser: 0.79 mg/dL (ref 0.57–1.00)
Globulin, Total: 2.2 g/dL (ref 1.5–4.5)
Glucose: 94 mg/dL (ref 70–99)
Potassium: 4.3 mmol/L (ref 3.5–5.2)
Sodium: 141 mmol/L (ref 134–144)
Total Protein: 6.8 g/dL (ref 6.0–8.5)
eGFR: 80 mL/min/{1.73_m2} (ref >59)

## 2023-07-18 LAB — LIPID PANEL
Chol/HDL Ratio: 3.1 {ratio} (ref 0.0–4.4)
Cholesterol, Total: 162 mg/dL (ref 100–199)
HDL: 52 mg/dL (ref >39)
LDL Chol Calc (NIH): 89 mg/dL (ref 0–99)
Triglycerides: 118 mg/dL (ref 0–149)
VLDL Cholesterol Cal: 21 mg/dL (ref 5–40)

## 2023-07-18 NOTE — Telephone Encounter (Signed)
-----   Message from Garwin Brothers sent at 07/18/2023  3:49 PM EDT ----- Initially on statin therapy.  If so double statin and liver lipid check in 6 weeks. ----- Message ----- From: Eleonore Chiquito, RN Sent: 07/18/2023   3:34 PM EDT To: Garwin Brothers, MD  Coronary artery disease is mild. Please bring patient in for a Chem-7 liver lipid check in the next few days. Does she need a statin ----- Message ----- From: Garwin Brothers, MD Sent: 07/18/2023   2:47 PM EDT To: Eleonore Chiquito, RN  The results of the study is unremarkable. Please inform patient. I will discuss in detail at next appointment. Cc  primary care/referring physician Garwin Brothers, MD 07/18/2023 2:47 PM

## 2023-07-18 NOTE — Telephone Encounter (Signed)
Viewed in MyChart Routed to PCP  

## 2023-08-06 ENCOUNTER — Ambulatory Visit (HOSPITAL_BASED_OUTPATIENT_CLINIC_OR_DEPARTMENT_OTHER)
Admission: RE | Admit: 2023-08-06 | Discharge: 2023-08-06 | Disposition: A | Discharge status: Home / Self Care | Payer: Medicare Other | Source: Ambulatory Visit | Attending: Cardiology | Admitting: Cardiology

## 2023-08-06 DIAGNOSIS — I209 Angina pectoris, unspecified: Secondary | ICD-10-CM | POA: Diagnosis not present

## 2023-08-06 DIAGNOSIS — R0609 Other forms of dyspnea: Secondary | ICD-10-CM | POA: Diagnosis not present

## 2023-08-08 LAB — ECHOCARDIOGRAM COMPLETE
AR max vel: 1.94 cm2
AV Area VTI: 2.12 cm2
AV Area mean vel: 2.03 cm2
AV Mean grad: 4 mmHg
AV Peak grad: 7.5 mmHg
Ao pk vel: 1.37 m/s
Area-P 1/2: 3.74 cm2
Calc EF: 62.1 %
MV M vel: 4.42 m/s
MV Peak grad: 78.1 mmHg
S' Lateral: 1.9 cm
Single Plane A2C EF: 60.5 %
Single Plane A4C EF: 60.9 %

## 2023-10-03 ENCOUNTER — Ambulatory Visit: Payer: Medicare Other | Admitting: Cardiology

## 2023-10-03 NOTE — Progress Notes (Addendum)
Cardiology Office Note    Date:  10/16/2023  ID:  Audrey Hall, Audrey Hall 05-22-1951, MRN [id number] PCP:  Daisy Floro, MD  Cardiologist:  Parke Poisson, MD  Electrophysiologist:  None   Chief Complaint: Follow up for CAD   History of Present Illness: .    Audrey Hall is a 72 y.o. female with visit-pertinent history of nonobstructive CAD noted on coronary CTA, hyperlipidemia, prior tobacco use, small PFO and COPD.  First evaluated on 07/04/2023 by Dr. Tomie China request of her PCP for coronary calcifications noted on cancer CT for screening.  At her office visit she noted occasional chest tightness and known history of dyspnea on exertion.  On 07/11/2023 she underwent coronary CTA that indicated mild CAD in the proximal mid LAD, 25 to 49% stenosis, total plaque volume of 674, 84th percentile for age and sex matched controls.  Her coronary calcium score was 801, placing her at the 96 percentile for age and sex matched control.  It was noted that she had a small patent foramen ovale with left-to-right shunt.  Her echocardiogram on 08/06/2023 indicated LVEF of 60 to 65%, LV with normal function, no RWMA, there is mild mitral valve regurgitation.  Today she presents for follow-up.  She reports that she is doing well overall.  She denies chest pain with exertion, notes an occasional "nerve pain" at the surface of her chest that is sharp and lasts a few seconds, not associated with exertion.  She notes that she has shortness of breath related to COPD, no significant change.  She denies lower extremity edema, orthopnea or PND.  She notes that she is not currently intentionally exercising although plans to start walking with her dogs after the first of the year.  Labwork independently reviewed: 07/17/23: sodium 141, K 4.3, creatinine 0.79, AST 31, ALT 23  ROS: .   Today she denies chest pain, lower extremity edema, fatigue, palpitations, melena, hematuria, hemoptysis, diaphoresis, weakness,  presyncope, syncope, orthopnea, and PND.  All other systems are reviewed and otherwise negative. Studies Reviewed: Marland Kitchen    EKG:  EKG is not ordered today.   CV Studies:  Cardiac Studies & Procedures      ECHOCARDIOGRAM  ECHOCARDIOGRAM COMPLETE 08/06/2023  Narrative ECHOCARDIOGRAM REPORT    Patient Name:   Audrey Hall Date of Exam: 08/06/2023 Medical Rec #:  [id number]      Height:       61.6 in Accession #:    [id number]     Weight:       149.1 lb Date of Birth:  February 12, 1951      BSA:          1.680 m Patient Age:    72 years       BP:           122/76 mmHg Patient Gender: F              HR:           95 bpm. Exam Location:  [place]  Procedure: 2D Echo, Cardiac Doppler and Color Doppler  Indications:    R01.1 Murmur  History:        Patient has no prior history of Echocardiogram examinations. PFO, Abnormal ECG, COPD, Signs/Symptoms:Murmur and Dyspnea; Risk Factors:Hypertension, Diabetes, Dyslipidemia and Current Smoker.  Sonographer:    Jake Seats RDMS, RVT, RDCS Referring Phys: Rito Ehrlich [facility]  IMPRESSIONS   1. Left ventricular ejection fraction, by estimation, is 60 to 65%.  The left ventricle has normal function. The left ventricle has no regional wall motion abnormalities. Undetermined rhythm. PFO cannot be ruled out. 2. Right ventricular systolic function is normal. The right ventricular size is normal. 3. The mitral valve is normal in structure. Mild mitral valve regurgitation. No evidence of mitral stenosis. 4. The aortic valve is normal in structure. Aortic valve regurgitation is not visualized. No aortic stenosis is present. 5. The inferior vena cava is normal in size with greater than 50% respiratory variability, suggesting right atrial pressure of 3 mmHg.  FINDINGS Left Ventricle: Left ventricular ejection fraction, by estimation, is 60 to 65%. The left ventricle has normal function. The left ventricle has no regional wall motion abnormalities. The  left ventricular internal cavity size was normal in size. There is no left ventricular hypertrophy. Left ventricular diastolic parameters are indeterminate.  Right Ventricle: The right ventricular size is normal. No increase in right ventricular wall thickness. Right ventricular systolic function is normal.  Left Atrium: Left atrial size was normal in size.  Right Atrium: Right atrial size was normal in size.  Pericardium: There is no evidence of pericardial effusion.  Mitral Valve: The mitral valve is normal in structure. Mild mitral valve regurgitation. No evidence of mitral valve stenosis.  Tricuspid Valve: The tricuspid valve is normal in structure. Tricuspid valve regurgitation is not demonstrated. No evidence of tricuspid stenosis.  Aortic Valve: The aortic valve is normal in structure. Aortic valve regurgitation is not visualized. No aortic stenosis is present. Aortic valve mean gradient measures 4.0 mmHg. Aortic valve peak gradient measures 7.5 mmHg. Aortic valve area, by VTI measures 2.12 cm.  Pulmonic Valve: The pulmonic valve was normal in structure. Pulmonic valve regurgitation is not visualized. No evidence of pulmonic stenosis.  Aorta: The aortic root is normal in size and structure.  Venous: The inferior vena cava is normal in size with greater than 50% respiratory variability, suggesting right atrial pressure of 3 mmHg.  IAS/Shunts: No atrial level shunt detected by color flow Doppler.   LEFT VENTRICLE PLAX 2D LVIDd:         2.90 cm     Diastology LVIDs:         1.90 cm     LV e' medial:    7.72 cm/s LV PW:         0.90 cm     LV E/e' medial:  11.0 LV IVS:        0.80 cm     LV e' lateral:   10.30 cm/s LVOT diam:     1.70 cm     LV E/e' lateral: 8.2 LV SV:         49 LV SV Index:   29 LVOT Area:     2.27 cm  LV Volumes (MOD) LV vol d, MOD A2C: 47.1 ml LV vol d, MOD A4C: 42.2 ml LV vol s, MOD A2C: 18.6 ml LV vol s, MOD A4C: 16.5 ml LV SV MOD A2C:     28.5  ml LV SV MOD A4C:     42.2 ml LV SV MOD BP:      28.5 ml  RIGHT VENTRICLE RV S prime:     11.50 cm/s TAPSE (M-mode): 2.2 cm  LEFT ATRIUM           Index        RIGHT ATRIUM          Index LA diam:      2.20 cm 1.31 cm/m  RA Area:     7.86 cm LA Vol (A2C): 24.3 ml 14.47 ml/m  RA Volume:   13.60 ml 8.10 ml/m LA Vol (A4C): 25.5 ml 15.18 ml/m AORTIC VALVE AV Area (Vmax):    1.94 cm AV Area (Vmean):   2.03 cm AV Area (VTI):     2.12 cm AV Vmax:           137.00 cm/s AV Vmean:          90.600 cm/s AV VTI:            0.231 m AV Peak Grad:      7.5 mmHg AV Mean Grad:      4.0 mmHg LVOT Vmax:         117.00 cm/s LVOT Vmean:        80.900 cm/s LVOT VTI:          0.216 m LVOT/AV VTI ratio: 0.94  AORTA Ao Root diam: 2.80 cm  MITRAL VALVE MV Area (PHT): 3.74 cm    SHUNTS MV Decel Time: 203 msec    Systemic VTI:  0.22 m MR Peak grad: 78.1 mmHg    Systemic Diam: 1.70 cm MR Vmax:      442.00 cm/s MV E velocity: 84.80 cm/s MV A velocity: 96.00 cm/s MV E/A ratio:  0.88  Belva Crome MD Electronically signed by Belva Crome MD Signature Date/Time: 08/08/2023/10:30:05 AM    Final    CT SCANS  CT CORONARY MORPH W/CTA COR W/SCORE 07/11/2023  Addendum 07/13/2023  3:56 PM ADDENDUM REPORT: 07/13/2023 15:54  EXAM: OVER-READ INTERPRETATION  PET-CT CHEST  The following report is an over-read performed by radiologist Dr. Leatha Gilding [facility] on 07/13/2023. This over-read does not include interpretation of cardiac or coronary anatomy or pathology. The cardiac CT interpretation by the cardiologist is to be attached.  COMPARISON:  None.  FINDINGS: No evidence for lymphadenopathy within the visualized mediastinum or hilar regions.  The visualized lung parenchyma shows no suspicious pulmonary nodule or mass. No focal airspace consolidation. No effusion.  Visualized portions of the upper abdomen are unremarkable.  No suspicious lytic or sclerotic  osseous abnormality.  IMPRESSION: No acute or clinically significant extracardiac findings.   Electronically Signed By: Kennith Center M.D. On: 07/13/2023 15:54  Narrative HISTORY: Chest pain, nonspecific Dyspnea on exertion (DOE)  EXAM: Cardiac/Coronary  CT  TECHNIQUE: The patient was scanned on a [employer].  PROTOCOL: A 120 kV prospective scan was triggered in the descending thoracic aorta at 111 HU's. Axial non-contrast 3 mm slices were carried out through the heart. The data set was analyzed on a dedicated work station and scored using the Agatston method. Gantry rotation speed was 250 msecs and collimation was .6 mm. Beta blockade and 0.8 mg of sl NTG was given. The 3D data set was reconstructed in 5% intervals of the 35-75 % of the R-R cycle. Systolic and diastolic phases were analyzed on a dedicated work station using MPR, MIP and VRT modes. The patient received contrast: 95mL OMNIPAQUE IOHEXOL 350 MG/ML SOLN.  FINDINGS: Image quality: Good  Noise artifact is: Limited  Coronary calcium score is 801, which places the patient in the 96th percentile for age and sex matched control.  Coronary arteries: Normal coronary origins.  Right dominance.  Right Coronary Artery: Minimal mixed atherosclerotic plaque in the proximal and mid RCA and proximal PDA, <25% stenosis.  Left Main Coronary Artery: Minimal mixed plaque in the proximal and distal LM, <25% stenosis.  Left Anterior Descending Coronary Artery: Mild mixed atherosclerotic plaque in the proximal and mid LAD, 25-49% stenosis.  Left Circumflex Artery: Minimal mixed plaque in the proximal LCx, <25% stenosis.  Aorta: Normal size, 28 mm at the mid ascending aorta (level of the PA bifurcation) measured double oblique. Aortic atherosclerosis.  Aortic Valve: No calcifications.  Other findings:  Normal pulmonary vein drainage into the left atrium.  Normal left atrial appendage without thrombus.  Chicken wing morphology.  Normal size of the pulmonary artery.  Small PFO with left to right shunt.  Please see separate report from [facility] for non-cardiac findings.  IMPRESSION: 1. Mild CAD in the proximal and mid LAD, 25-49% stenosis, CADRADS 2.  2. Total plaque volume 674 mm3 which is 84th percentile for age- and sex-matched controls (calcified plaque 131 mm3; non-calcified plaque 543 mm3). TPV is severe.  3. Coronary calcium score is 801, which places the patient in the 96th percentile for age and sex matched control.  4. Normal coronary origins with right dominance.  5. Small patent foramen ovale with left to right shunt.  RECOMMENDATIONS: CAD-RADS 2. Mild non-obstructive CAD (25-49%). Consider non-atherosclerotic causes of chest pain. Consider preventive therapy and risk factor modification.  Electronically Signed: By: Weston Brass M.D. On: 07/11/2023 10:54           Current Reported Medications:.    Current Meds  Medication Sig   albuterol (VENTOLIN HFA) 108 (90 Base) MCG/ACT inhaler Inhale 1-2 puffs into the lungs every 4 (four) hours as needed for wheezing or shortness of breath.   ALPRAZolam (XANAX) 0.5 MG tablet Take 0.5-1 mg by mouth at bedtime as needed for anxiety.   aspirin EC 81 MG tablet Take 81 mg by mouth daily.   B Complex Vitamins (VITAMIN B COMPLEX) TABS Take 1 tablet by mouth daily.   Cholecalciferol (VITAMIN D3) 5000 UNITS CAPS Take 1 capsule by mouth daily.   estradiol (ESTRACE) 0.1 MG/GM vaginal cream Place 1 Applicatorful vaginally 2 (two) times a week.   fluticasone (FLONASE) 50 MCG/ACT nasal spray Place 1 spray into both nostrils daily.   lansoprazole (PREVACID) 30 MG capsule Take 30 mg by mouth daily.   loratadine (CLARITIN) 10 MG tablet Take 10 mg by mouth daily.   medroxyPROGESTERone (PROVERA) 2.5 MG tablet Take 2.5 mg by mouth daily.   melatonin 1 MG TABS tablet Take 1 mg by mouth at bedtime.   [DISCONTINUED]  atorvastatin (LIPITOR) 20 MG tablet Take 1 tablet (20 mg total) by mouth daily.   Physical Exam:    VS:  BP 126/68   Pulse 89   Ht 5\' 1"  (1.549 m)   Wt 149 lb 3.2 oz (67.7 kg)   SpO2 97%   BMI 28.19 kg/m    Wt Readings from Last 3 Encounters:  10/04/23 149 lb 3.2 oz (67.7 kg)  07/04/23 149 lb 1.9 oz (67.6 kg)  09/25/20 128 lb (58.1 kg)    GEN: Well nourished, well developed in no acute distress NECK: No JVD; No carotid bruits CARDIAC: RRR, 1/6 systolic murmur, no rubs or gallops RESPIRATORY: Clear to auscultation without rales, wheezing or rhonchi  ABDOMEN: Soft, non-tender, non-distended EXTREMITIES:  No edema; No acute deformity   Asessement and Plan:.    Nonobstructive CAD:  Coronary CTA on 07/11/23 indicated mild CAD in the proximal mid LAD, 25 to 49% stenosis, total plaque volume of 674, 84th percentile for age and sex matched controls.  Her coronary calcium score was 801, placing her at  the 96 percentile for age and sex matched control. Today she reports no further chest pain, she notes she will have an occasional "nerve like" pain that a sharp shooting pain lasting a few seconds that is not associated with exertion.  She notes that her shortness of breath is from COPD and is close to baseline.  Her blood pressure is well-controlled without antihypertensive medications. Heart healthy diet and regular cardiovascular exercise encouraged. She notes that she plans to start walking her dogs regularly in the new year. Reviewed ED precautions. Continue aspirin 81 mg daily, Lipitor 20 mg daily, sublingual nitroglycerin as needed for chest pain.  Hyperlipidemia: Last lipid profile on 07/17/2023 indicated total cholesterol 162, HDL 52, triglycerides 118 and LDL 89. Dr. Tomie China increased atorvastatin to 20 mg daily. Discussed that her LDL goal is less than 70. Check fasting lipid profile and LFTs today.   PFO: Patient noted to have a small PFO with left-to-right shunt on her coronary CTA.  Dr.  Tomie China recommended medical management.  Tobacco use: Patient reports that she currently smokes 2 packs a day. Cessation encouraged, she is not interested at this time.   Sleep disordered breathing: Patient notes she snores loudly at night and wakes up gasping. Check ITAMAR. STOP Bang 4.   ADDENDUM: Patient care transferred from Dr. Tomie China to Dr. Jacques Navy, both providers have agreed to change.    Disposition: F/u with Reather Littler, NP in 6 months or sooner if needed.   Signed, Rip Harbour, NP

## 2023-10-04 ENCOUNTER — Ambulatory Visit: Payer: Medicare Other | Attending: Cardiology | Admitting: Cardiology

## 2023-10-04 ENCOUNTER — Encounter: Payer: Self-pay | Admitting: Cardiology

## 2023-10-04 VITALS — BP 126/68 | HR 89 | Ht 61.0 in | Wt 149.2 lb

## 2023-10-04 DIAGNOSIS — Q2112 Patent foramen ovale: Secondary | ICD-10-CM | POA: Insufficient documentation

## 2023-10-04 DIAGNOSIS — I2510 Atherosclerotic heart disease of native coronary artery without angina pectoris: Secondary | ICD-10-CM | POA: Diagnosis not present

## 2023-10-04 DIAGNOSIS — I700 Atherosclerosis of aorta: Secondary | ICD-10-CM | POA: Insufficient documentation

## 2023-10-04 DIAGNOSIS — E7800 Pure hypercholesterolemia, unspecified: Secondary | ICD-10-CM | POA: Insufficient documentation

## 2023-10-04 DIAGNOSIS — G4730 Sleep apnea, unspecified: Secondary | ICD-10-CM | POA: Diagnosis present

## 2023-10-04 DIAGNOSIS — Z720 Tobacco use: Secondary | ICD-10-CM | POA: Insufficient documentation

## 2023-10-04 LAB — LIPID PANEL
Chol/HDL Ratio: 3.3 {ratio} (ref 0.0–4.4)
Cholesterol, Total: 169 mg/dL (ref 100–199)
HDL: 52 mg/dL (ref >39)
LDL Chol Calc (NIH): 100 mg/dL — ABNORMAL HIGH (ref 0–99)
Triglycerides: 93 mg/dL (ref 0–149)
VLDL Cholesterol Cal: 17 mg/dL (ref 5–40)

## 2023-10-04 LAB — HEPATIC FUNCTION PANEL
ALT: 26 [IU]/L (ref 0–32)
AST: 30 [IU]/L (ref 0–40)
Albumin: 4.6 g/dL (ref 3.9–4.9)
Alkaline Phosphatase: 75 [IU]/L (ref 44–121)
Bilirubin Total: 0.5 mg/dL (ref 0.0–1.2)
Bilirubin, Direct: 0.18 mg/dL (ref 0.00–0.40)
Total Protein: 6.9 g/dL (ref 6.0–8.5)

## 2023-10-04 NOTE — Patient Instructions (Addendum)
Medication Instructions:  No changes *If you need a refill on your cardiac medications before your next appointment, please call your pharmacy*   Lab Work: Today we will need to drawn Lipid panel and LFT If you have labs (blood work) drawn today and your tests are completely normal, you will receive your results only by: MyChart Message (if you have MyChart) OR A paper copy in the mail If you have any lab test that is abnormal or we need to change your treatment, we will call you to review the results.   Testing/Procedures: WatchPAT?  Is a FDA cleared portable home sleep study test that uses a watch and 3 points of contact to monitor 7 different channels, including your heart rate, oxygen saturations, body position, snoring, and chest motion.  The study is easy to use from the comfort of your own home and accurately detect sleep apnea.  Before bed, you attach the chest sensor, attached the sleep apnea bracelet to your nondominant hand, and attach the finger probe.  After the study, the raw data is downloaded from the watch and scored for apnea events.   For more information: [email or web address]  Patient Testing Instructions:  Do not put battery into the device until bedtime when you are ready to begin the test. Please call the support number if you need assistance after following the instructions below: 24 hour support line- [phone] or ITAMAR support at [phone] (option 2)  Download the IntelWatchPAT One" app through the google play store or App Store  Be sure to turn on or enable access to bluetooth in settlings on your smartphone/ device  Make sure no other bluetooth devices are on and within the vicinity of your smartphone/ device and WatchPAT watch during testing.  Make sure to leave your smart phone/ device plugged in and charging all night.  When ready for bed:  Follow the instructions step by step in the WatchPAT One App to activate the testing  device. For additional instructions, including video instruction, visit the WatchPAT One video on Youtube. You can search for WatchPat One within Youtube (video is 4 minutes and 18 seconds) or enter: https://youtube/watch?v=BCce_vbiwxE Please note: You will be prompted to enter a Pin to connect via bluetooth when starting the test. The PIN will be assigned to you when you receive the test.  The device is disposable, but it recommended that you retain the device until you receive a call letting you know the study has been received and the results have been interpreted.  We will let you know if the study did not transmit to [place] properly after the test is completed. You do not need to call us to confirm the receipt of the test.  Please complete the test within 48 hours of receiving PIN.   Frequently Asked Questions:  What is Watch Dennie Bible one?  A single use fully disposable home sleep apnea testing device and will not need to be returned after completion.  What are the requirements to use WatchPAT one?  The be able to have a successful watchpat one sleep study, you should have your Watch pat one device, your smart phone, watch pat one app, your PIN number and Internet access What type of phone do I need?  You should have a smart phone that uses Android 5.1 and above or any Iphone with IOS 10 and above How can I download the WatchPAT one app?  Based on your device type search for WatchPAT one app either in google play  for android devices or APP store for Iphone's Where will I get my PIN for the study?  Your PIN will be provided by your physician's office. It is used for authentication and if you lose/forget your PIN, please reach out to your providers office.  I do not have Internet at home. Can I do WatchPAT one study?  WatchPAT One needs Internet connection throughout the night to be able to transmit the sleep data. You can use your home/local internet or your cellular's data package. However, it is  always recommended to use home/local Internet. It is estimated that between 20MB-30MB will be used with each study.However, the application will be looking for space in the phone to start the study.  What happens if I lose internet or bluetooth connection?  During the internet disconnection, your phone will not be able to transmit the sleep data. All the data, will be stored in your phone. As soon as the internet connection is back on, the phone will being sending the sleep data. During the bluetooth disconnection, WatchPAT one will not be able to to send the sleep data to your phone. Data will be kept in the [facility] one until two devices have bluetooth connection back on. As soon as the connection is back on, WatchPAT one will send the sleep data to the phone.  How long do I need to wear the WatchPAT one?  After you start the study, you should wear the device at least 6 hours.  How far should I keep my phone from the device?  During the night, your phone should be within 15 feet.  What happens if I leave the room for restroom or other reasons?  Leaving the room for any reason will not cause any problem. As soon as your get back to the room, both devices will reconnect and will continue to send the sleep data. Can I use my phone during the sleep study?  Yes, you can use your phone as usual during the study. But it is recommended to put your watchpat one on when you are ready to go to bed.  How will I get my study results?  A soon as you completed your study, your sleep data will be sent to the provider. They will then share the results with you when they are ready.     Follow-Up: At [facility], you and your health needs are our priority.  As part of our continuing mission to provide you with exceptional heart care, we have created designated Provider Care Teams.  These Care Teams include your primary Cardiologist (physician) and Advanced Practice Providers (APPs -  Physician  Assistants and Nurse Practitioners) who all work together to provide you with the care you need, when you need it.  We recommend signing up for the patient portal called "MyChart".  Sign up information is provided on this After Visit Summary.  MyChart is used to connect with patients for Virtual Visits (Telemedicine).  Patients are able to view lab/test results, encounter notes, upcoming appointments, etc.  Non-urgent messages can be sent to your provider as well.   To learn more about what you can do with MyChart, go to [email or web address].    Your next appointment:   6 month(s)  Provider:   Reather Littler, NP

## 2023-10-04 NOTE — Progress Notes (Signed)
Patient agreement reviewed and signed on 10/04/2023.  WatchPAT issued to patient on 10/04/2023 by Delanna Notice, CMA.  Patient aware to not open the WatchPAT box until contacted with the activation PIN. Patient profile initialized in CloudPAT on 10/04/2023 by Ashley Mariner, LPN. Device serial number: [id number]

## 2023-10-05 ENCOUNTER — Telehealth: Payer: Self-pay

## 2023-10-05 DIAGNOSIS — I2510 Atherosclerotic heart disease of native coronary artery without angina pectoris: Secondary | ICD-10-CM

## 2023-10-05 MED ORDER — ATORVASTATIN CALCIUM 40 MG PO TABS: 40.0000 mg | ORAL_TABLET | Freq: Every day | ORAL | 3 refills | Status: DC

## 2023-10-05 NOTE — Telephone Encounter (Signed)
-----   Message from Rip Harbour sent at 10/04/2023  9:16 PM EST ----- Please let Ms. Audrey Hall know that her LDL is not at goal of less than 70. Her lipid profile shows her LDL is now 100. Please confirm she has been taking atorvastatin 20 mg daily, if not please have her start and repeat fasting lipid profile and LFTs in 2 months. If she has been taking her atorvastatin, increase to atorvastatin 40 mg daily with fasting lipid profile and LFTs in 2 months.

## 2023-10-05 NOTE — Telephone Encounter (Signed)
Called patient to advise of below.

## 2023-11-01 ENCOUNTER — Ambulatory Visit (HOSPITAL_BASED_OUTPATIENT_CLINIC_OR_DEPARTMENT_OTHER)
Admission: RE | Admit: 2023-11-01 | Discharge: 2023-11-01 | Disposition: A | Discharge status: Home / Self Care | Payer: Medicare Other | Source: Ambulatory Visit | Attending: Acute Care | Admitting: Acute Care

## 2023-11-01 DIAGNOSIS — F17210 Nicotine dependence, cigarettes, uncomplicated: Secondary | ICD-10-CM | POA: Insufficient documentation

## 2023-11-01 DIAGNOSIS — R911 Solitary pulmonary nodule: Secondary | ICD-10-CM | POA: Diagnosis present

## 2023-11-09 ENCOUNTER — Telehealth: Payer: Self-pay | Admitting: Acute Care

## 2023-11-09 NOTE — Telephone Encounter (Signed)
MJ calling with call report. For CT scan. MJ phone number is [phone].

## 2023-11-09 NOTE — Telephone Encounter (Signed)
Call report received   IMPRESSION: Lung-RADS 4A, suspicious. 5.3 mm new nodule of concern in the right upper lobe previously shows mild interval progression in the interval measuring 6.3 mm today. Follow up low-dose chest CT without contrast in 3 months (please use the following order, "CT CHEST LCS NODULE FOLLOW-UP W/O CM") is recommended. Alternatively, PET may be considered when there is a solid component 8mm or larger.

## 2023-11-14 NOTE — Telephone Encounter (Signed)
Spoke with Kandice Robinsons NP - due to patient having a 6 month follow up scan with growth of a nodule Maralyn Sago would like patient to have 3 month follow up scan. Will also need to check with patient to see if she has had any weight loss or hemoptysis. Repeat scan will be due 01/31/2024.

## 2023-11-15 ENCOUNTER — Other Ambulatory Visit: Payer: Self-pay

## 2023-11-15 DIAGNOSIS — R911 Solitary pulmonary nodule: Secondary | ICD-10-CM

## 2023-11-15 DIAGNOSIS — Z122 Encounter for screening for malignant neoplasm of respiratory organs: Secondary | ICD-10-CM

## 2023-11-15 DIAGNOSIS — Z87891 Personal history of nicotine dependence: Secondary | ICD-10-CM

## 2023-11-15 DIAGNOSIS — F17210 Nicotine dependence, cigarettes, uncomplicated: Secondary | ICD-10-CM

## 2023-11-15 NOTE — Telephone Encounter (Signed)
Called patient, no answer. LVM to call office and review results.

## 2023-11-15 NOTE — Telephone Encounter (Signed)
Spoke with patient and reviewed results. Advised patient 3 month f/u scan has been recommended due to growth of nodule since 6 month scan. She denies any sickness, weight loss and hemoptysis. CT order has been placed and scheduled for 01/31/2024. Patient is in agreement with plan. Results and plan sent to PCP.

## 2023-11-16 ENCOUNTER — Telehealth: Payer: Self-pay

## 2023-11-16 NOTE — Telephone Encounter (Signed)
Ordering provider: Chad Associated diagnoses: G47.33 WatchPAT PA obtained on 11/16/2023 by Brunetta Genera, LPN. Authorization: Yes; tracking ID No PA Required Patient notified of PIN ([id number]) on 11/16/2023 via Notification Method: phone.

## 2023-11-20 ENCOUNTER — Telehealth: Payer: Self-pay | Admitting: Internal Medicine

## 2023-11-20 NOTE — Telephone Encounter (Signed)
Patient calling about the sleep study because she is having issues with the connection to the watch. Please advise

## 2023-11-26 NOTE — Telephone Encounter (Signed)
 Notified patient that device was not registered. Apologized on behalf of our staff for miscommunication. Patient was very understanding. All questions were answered and patient verbalized understanding. Device is registered and patient will attempt Sleep Study tonight 11/26/23

## 2023-11-28 ENCOUNTER — Encounter (INDEPENDENT_AMBULATORY_CARE_PROVIDER_SITE_OTHER): Payer: Medicare Other | Admitting: Cardiology

## 2023-11-28 DIAGNOSIS — R0683 Snoring: Secondary | ICD-10-CM

## 2023-12-06 ENCOUNTER — Ambulatory Visit: Payer: Medicare Other | Attending: Cardiology

## 2023-12-06 DIAGNOSIS — G4730 Sleep apnea, unspecified: Secondary | ICD-10-CM

## 2023-12-06 NOTE — Procedures (Signed)
   SLEEP STUDY REPORT Patient Information Study Date: 11/28/2023 Patient Name: Audrey Hall Patient ID: [id number] Birth Date: 09/03/1951 Age: 73 Gender: Female BMI: 28.3 (W=150 lb, H=5' 1'') Referring Physician: Edd Fabian, NP  TEST DESCRIPTION: Home sleep apnea testing was completed using the WatchPat, a Type 1 device, utilizing peripheral arterial tonometry (PAT), chest movement, actigraphy, pulse oximetry, pulse rate, body position and snore. AHI was calculated with apnea and hypopnea using valid sleep time as the denominator. RDI includes apneas, hypopneas, and RERAs. The data acquired and the scoring of sleep and all associated events were performed in accordance with the recommended standards and specifications as outlined in the AASM Manual for the Scoring of Sleep and Associated Events 2.2.0 (2015).  FINDINGS: 1. No evidence of Obstructive Sleep Apnea with AHI 1.2/hr. 2. No Central Sleep Apnea. 3. Oxygen desaturations as low as 88%. 4. Mild to moderate snoring was present. O2 sats were < 88% for 0.1 minutes. 5. Total sleep time was 7 hrs and 50 min. 6. 27.3% of total sleep time was spent in REM sleep. 7. Normal sleep onset latency at 19 min. 8. Shortened REM sleep onset latency at 85 min. 9. Total awakenings were 3.  DIAGNOSIS: Normal study with no significant sleep disordered breathing.  RECOMMENDATIONS: 1. Normal study with no significant sleep disordered breathing.  2. Healthy sleep recommendations include: adequate nightly sleep (normal 7-9 hrs/night), avoidance of caffeine after noon and alcohol near bedtime, and maintaining a sleep environment that is cool, dark and quiet.  3. Weight loss for overweight patients is recommended.  4. Snoring recommendations include: weight loss where appropriate, side sleeping, and avoidance of alcohol before bed.  5. Operation of motor vehicle or dangerous equipment must be avoided when feeling drowsy, excessively sleepy,  or mentally fatigued.  6. An ENT consultation which may be useful for specific causes of and possible treatment of bothersome snoring .  7. Weight loss may be of benefit in reducing the severity of snoring.   Signature: Armanda Magic, MD; [facility]; Diplomat, American Board of Sleep Medicine Electronically Signed: 12/06/2023 11:27:36 AM

## 2023-12-08 LAB — LIPID PANEL
Chol/HDL Ratio: 3 {ratio} (ref 0.0–4.4)
Cholesterol, Total: 161 mg/dL (ref 100–199)
HDL: 54 mg/dL (ref >39)
LDL Chol Calc (NIH): 85 mg/dL (ref 0–99)
Triglycerides: 122 mg/dL (ref 0–149)
VLDL Cholesterol Cal: 22 mg/dL (ref 5–40)

## 2023-12-08 LAB — HEPATIC FUNCTION PANEL
ALT: 23 [IU]/L (ref 0–32)
AST: 31 [IU]/L (ref 0–40)
Albumin: 4.7 g/dL (ref 3.9–4.9)
Alkaline Phosphatase: 76 [IU]/L (ref 44–121)
Bilirubin Total: 0.5 mg/dL (ref 0.0–1.2)
Bilirubin, Direct: 0.22 mg/dL (ref 0.00–0.40)
Total Protein: 6.9 g/dL (ref 6.0–8.5)

## 2023-12-11 ENCOUNTER — Telehealth: Payer: Self-pay | Admitting: *Deleted

## 2023-12-11 NOTE — Telephone Encounter (Signed)
 The patient has been notified of the result. Per [facility] Lmtcb study showed no significant sleep apnea.

## 2023-12-11 NOTE — Telephone Encounter (Signed)
-----   Message from Armanda Magic sent at 12/06/2023 11:31 AM EST ----- Please let patient know that sleep study showed no significant sleep apnea.

## 2024-01-31 ENCOUNTER — Ambulatory Visit (HOSPITAL_BASED_OUTPATIENT_CLINIC_OR_DEPARTMENT_OTHER)
Admission: RE | Admit: 2024-01-31 | Discharge: 2024-01-31 | Disposition: A | Discharge status: Home / Self Care | Payer: BC Managed Care – PPO | Source: Ambulatory Visit | Attending: Acute Care | Admitting: Acute Care

## 2024-01-31 DIAGNOSIS — Z122 Encounter for screening for malignant neoplasm of respiratory organs: Secondary | ICD-10-CM | POA: Insufficient documentation

## 2024-01-31 DIAGNOSIS — R911 Solitary pulmonary nodule: Secondary | ICD-10-CM | POA: Diagnosis present

## 2024-01-31 DIAGNOSIS — F17210 Nicotine dependence, cigarettes, uncomplicated: Secondary | ICD-10-CM | POA: Diagnosis present

## 2024-01-31 DIAGNOSIS — Z87891 Personal history of nicotine dependence: Secondary | ICD-10-CM | POA: Diagnosis present

## 2024-02-27 ENCOUNTER — Telehealth: Payer: Self-pay | Admitting: *Deleted

## 2024-02-27 ENCOUNTER — Other Ambulatory Visit: Payer: Self-pay | Admitting: Acute Care

## 2024-02-27 ENCOUNTER — Telehealth: Payer: Self-pay

## 2024-02-27 ENCOUNTER — Telehealth: Payer: Self-pay | Admitting: Acute Care

## 2024-02-27 DIAGNOSIS — R911 Solitary pulmonary nodule: Secondary | ICD-10-CM

## 2024-02-27 NOTE — Telephone Encounter (Signed)
 Call Report from [facility]:  IMPRESSION: 1. Lung-RADS 4B, suspicious. 6.3 mm right upper lobe nodule of concern previously is now 8.3 mm. Additional imaging evaluation or consultation with Pulmonology or Thoracic Surgery recommended. 2.  Emphysema (ICD10-J43.9) and Aortic Atherosclerosis (ICD10-170.0)

## 2024-02-27 NOTE — Telephone Encounter (Signed)
 I have called the patient with the results of the low-dose CT.  I explained that her scan was read as a lung RADS 4B.  This was a follow-up of a lung RADS 4A.  Patient has a right upper lobe pulmonary nodule that measured 6.3 mm on the November 01, 2023 scan and now reads as 8.3 mm on the January 31, 2024 scan. I discussed with the patient that the next best step would be for a PET scan to further evaluate this nodule. She is in agreement with this plan. PET scan has been ordered Patient will follow-up with me in the [address] office to go over the results of the scan and determine next best steps in care. Screening team, please fax results to PCP and let them know plan for follow-up of this finding.

## 2024-02-27 NOTE — Telephone Encounter (Signed)
 Call report from [facility]iology:  IMPRESSION: 1. Lung-RADS 4B, suspicious. 6.3 mm right upper lobe nodule of concern previously is now 8.3 mm. Additional imaging evaluation or consultation with Pulmonology or Thoracic Surgery recommended. 2.  Emphysema (ICD10-J43.9) and Aortic Atherosclerosis (ICD10-170.0)

## 2024-02-28 NOTE — Telephone Encounter (Signed)
 Results faxed to PCP

## 2024-03-03 ENCOUNTER — Other Ambulatory Visit: Payer: Self-pay | Admitting: Family Medicine

## 2024-03-03 DIAGNOSIS — Z1231 Encounter for screening mammogram for malignant neoplasm of breast: Secondary | ICD-10-CM

## 2024-03-06 ENCOUNTER — Ambulatory Visit (HOSPITAL_COMMUNITY)
Admission: RE | Admit: 2024-03-06 | Discharge: 2024-03-06 | Disposition: A | Discharge status: Home / Self Care | Source: Ambulatory Visit | Attending: Acute Care | Admitting: Acute Care

## 2024-03-06 DIAGNOSIS — R911 Solitary pulmonary nodule: Secondary | ICD-10-CM | POA: Insufficient documentation

## 2024-03-06 LAB — GLUCOSE, CAPILLARY: Glucose-Capillary: 115 mg/dL — ABNORMAL HIGH (ref 70–99)

## 2024-03-06 MED ORDER — FLUDEOXYGLUCOSE F - 18 (FDG) INJECTION
7.4500 | Freq: Once | INTRAVENOUS | Status: AC
  Administered 2024-03-06: 7.31 via INTRAVENOUS

## 2024-03-18 ENCOUNTER — Ambulatory Visit: Admitting: Acute Care

## 2024-03-18 ENCOUNTER — Encounter: Payer: Self-pay | Admitting: Acute Care

## 2024-03-18 VITALS — BP 109/66 | HR 90 | Ht 61.0 in | Wt 145.4 lb

## 2024-03-18 DIAGNOSIS — R928 Other abnormal and inconclusive findings on diagnostic imaging of breast: Secondary | ICD-10-CM

## 2024-03-18 DIAGNOSIS — R911 Solitary pulmonary nodule: Secondary | ICD-10-CM | POA: Diagnosis not present

## 2024-03-18 DIAGNOSIS — F17200 Nicotine dependence, unspecified, uncomplicated: Secondary | ICD-10-CM | POA: Diagnosis not present

## 2024-03-18 NOTE — Patient Instructions (Addendum)
 It is good to see you today. Your PET scan did not show any uptake in the Right Upper Lobe nodule of concern. This is great news.  We will do a 3 month follow up CT Chest which will be due 06/06/2024 . You will get a call to get this scheduled closer to the date to schedule . You will follow up with me after the scan in the office to review the results.  Please follow up with your OB GYN regarding the left breast findings on the PET scan.  Call if you need us  sooner.  Please contact office for sooner follow up if symptoms do not improve or worsen or seek emergency care   You can receive free nicotine replacement therapy (patches, gum, or mints) by calling 1-800-QUIT NOW. Please call so we can get you on the path to becoming a non-smoker. I know it is hard, but you can do this!  Hypnosis for smoking cessation  [employer] [phone]  Acupuncture for smoking cessation  [employer] [phone]

## 2024-03-18 NOTE — Progress Notes (Signed)
 History of Present Illness Audrey Hall is a 73 y.o. female followed through the lung cancer screening program presenting today for an abnormal screening scan. She will be followed by the screening program, and Dr. Baldwin Levee.   03/18/2024 Pt. Presents for follow up after PET scan.  Patient states she is doing well and has no complaints.  Patient has had 2 abnormal lung cancer screening scans.  She had a screening scan November 01, 2023 that was read as a 4A.  There was a 5.3 mm lung nodule of concern in the right upper lobe that had shown mild interval progression to 6.3 mm.  Plan after that scan was for a 26-month follow-up low-dose CT.  The follow-up scan was done January 31, 2024 and read Feb 27, 2024 and it was read as a lung RADS 4B.  The 6.3 mm right upper lobe nodule of concern had increased in size to 8.3 mm.  At that point in time we opted to do a PET scan to better evaluate this finding.  PET scan was done 03/06/2024 and read 03/06/2024.  On pet imaging the right upper lobe nodule of concern had decreased in size to 5 mm and had an SUV max of 1.3.   There was also notation of a 4 mm subpleural nodule in the left lower lobe with no metabolic activity.  There was an area along the medial glandular tissues of the left breast with a maximum SUV of 3.7.  This was noted to be above the glandular tissue of the lateral breast which had a maximum SUV of 2.8.  Patient has had multiple workups of this area of the breast.  She is scheduled for a mammogram July 2025 and per radiology's notation they do not feel patient needs to do anything other than follow-up with her July 2025 mammogram.  Patient verbalized understanding.  I have asked her to share this information with her gynecologist to ensure that they are in agreement.  Patient is continuing to smoke.  We have counseled her again to work hard to quit smoking completely.  And given her resources.  Plan will be for 95-month follow-up CT chest which will  be due August 2025 to reevaluate the nodule for stability.  Patient is in agreement with this plan.   Test Results: PET scan 03/06/2024 ( Read 03/06/2024) A 5 mm right upper lobe nodule has a maximum standard uptake value of 1.3, although is below sensitive PET-CT size thresholds and accordingly indeterminate. 2. A 4 mm subpleural nodule in the superior segment left lower lobe has no perceptible associated accentuated metabolic activity but is below sensitive PET-CT size thresholds. 3. Mild focally accentuated activity is observed along the medial glandular tissues of the left breast, maximum SUV 3.7. This is only mildly above the glandular tissue of the contralateral breast has a maximum SUV of 2.8. Accordingly I think it is reasonable that the patient follow her previously recommended diagnostic mammogram in July of 2025. 4. Airway thickening is present, suggesting bronchitis or reactive airways disease. 5. Aortic Atherosclerosis (ICD10-I70.0) and Emphysema (ICD10-J43.9).  LDCT  01/31/2024 ( Read 02/27/2024)   Mediastinum/Nodes: No mediastinal lymphadenopathy. No evidence for gross hilar lymphadenopathy although assessment is limited by the lack of intravenous contrast on the current study. The esophagus has normal imaging features. There is no axillary lymphadenopathy.   Lungs/Pleura: Centrilobular and paraseptal emphysema evident. Right upper lobe pulmonary nodule measured previously at 6.3 mm is now 8.3 mm. Multiple additional bilateral tiny  pulmonary nodules are stable. No new suspicious pulmonary nodule or mass. No focal airspace consolidation. No pleural effusion.   Upper Abdomen: Subtle nodularity of liver contour is more conspicuous than on the prior study, potentially technical. Visualized upper abdomen otherwise unremarkable.   Musculoskeletal: No worrisome lytic or sclerotic osseous abnormality.   IMPRESSION: 1. Lung-RADS 4B, suspicious. 6.3 mm right upper lobe  nodule of concern previously is now 8.3 mm. Additional imaging evaluation or consultation with Pulmonology or Thoracic Surgery recommended. 2.  Emphysema (ICD10-J43.9) and Aortic Atherosclerosis (ICD10-170.0)     Latest Ref Rng & Units 09/25/2020    7:40 AM 05/24/2020    6:13 AM 02/27/2014    1:32 PM  CBC  WBC 4.0 - 10.5 K/uL 13.5  14.4    Hemoglobin 12.0 - 15.0 g/dL 13.0  86.5  78.4   Hematocrit 36.0 - 46.0 % 43.3  44.5  43.0   Platelets 150 - 400 K/uL 407  395         Latest Ref Rng & Units 07/17/2023    9:09 AM 07/06/2023   11:06 AM 09/25/2020    7:40 AM  BMP  Glucose 70 - 99 mg/dL 94  92  696   BUN 8 - 27 mg/dL 9  13  22    Creatinine 0.57 - 1.00 mg/dL 2.95  2.84  1.32   BUN/Creat Ratio 12 - 28 11  14     Sodium 134 - 144 mmol/L 141  141  135   Potassium 3.5 - 5.2 mmol/L 4.3  4.9  3.7   Chloride 96 - 106 mmol/L 105  103  103   CO2 20 - 29 mmol/L 21  22  22    Calcium  8.7 - 10.3 mg/dL 9.7  44.0  9.5     BNP No results found for: "BNP"  ProBNP No results found for: "PROBNP"  PFT No results found for: "FEV1PRE", "FEV1POST", "FVCPRE", "FVCPOST", "TLC", "DLCOUNC", "PREFEV1FVCRT", "PSTFEV1FVCRT"  NM PET Image Initial (PI) Skull Base To Thigh Result Date: 03/06/2024 CLINICAL DATA:  Initial treatment strategy for lung nodule. EXAM: NUCLEAR MEDICINE PET SKULL BASE TO THIGH TECHNIQUE: 7.3 mCi F-18 FDG was injected intravenously. Full-ring PET imaging was performed from the skull base to thigh after the radiotracer. CT data was obtained and used for attenuation correction and anatomic localization. Fasting blood glucose: 115 mg/dl COMPARISON:  Chest CT 08/12/2535 FINDINGS: Mediastinal blood pool activity: SUV max 2.2 Liver activity: SUV max NA NECK: Muscular activity in the left deltoid without CT correlate, likely physiologic. Incidental CT findings: Bilateral common carotid atherosclerosis. CHEST: A 5 mm right upper lobe nodule on image 15 of series 7 of today's PET-CT has a maximum  standard uptake value of 1.3, although is below sensitive PET-CT size thresholds and accordingly indeterminate. As best I am able to tell, this is probably the lesion referred to on the previous two lung cancer screening CT scans. A 4 mm subpleural nodule in the superior segment left lower lobe has no perceptible associated accentuated metabolic activity but is below sensitive PET-CT size thresholds. Mild focally accentuated activity is observed along the medial glandular tissues of the left breast, maximum SUV 3.7. This is only mildly above the glandular tissue of the contralateral breast has a maximum SUV of 2.8. Accordingly I think it is reasonable that the patient follow her previously recommended diagnostic mammogram in July of 2025. Incidental CT findings: Coronary, aortic arch, and branch vessel atherosclerotic vascular disease. Centrilobular emphysema. Airway thickening is present, suggesting bronchitis  or reactive airways disease. ABDOMEN/PELVIS: No significant abnormal hypermetabolic activity in this region. Incidental CT findings: Atherosclerosis is present, including aortoiliac atherosclerotic disease. Cholecystectomy. Sigmoid colon diverticulosis. SKELETON: No significant abnormal hypermetabolic activity in this region. Incidental CT findings: Dextroconvex thoracolumbar scoliosis with rotary component. Degenerative disc disease and degenerative endplate findings at L3-4 and L4-5. IMPRESSION: 1. A 5 mm right upper lobe nodule has a maximum standard uptake value of 1.3, although is below sensitive PET-CT size thresholds and accordingly indeterminate. 2. A 4 mm subpleural nodule in the superior segment left lower lobe has no perceptible associated accentuated metabolic activity but is below sensitive PET-CT size thresholds. 3. Mild focally accentuated activity is observed along the medial glandular tissues of the left breast, maximum SUV 3.7. This is only mildly above the glandular tissue of the  contralateral breast has a maximum SUV of 2.8. Accordingly I think it is reasonable that the patient follow her previously recommended diagnostic mammogram in July of 2025. 4. Airway thickening is present, suggesting bronchitis or reactive airways disease. 5. Aortic Atherosclerosis (ICD10-I70.0) and Emphysema (ICD10-J43.9). Electronically Signed   By: Freida Jes M.D.   On: 03/06/2024 14:29     Past medical hx Past Medical History:  Diagnosis Date   Abdominal bloating    Abnormal CT scan    Allergic rhinitis    Anxiety    Aortic atherosclerosis (HCC)    BMI 28.0-28.9,adult    BPPV (benign paroxysmal positional vertigo), unspecified laterality 09/12/2017   Breast anomaly    COPD (chronic obstructive pulmonary disease) (HCC)    Full incontinence of feces    GERD without esophagitis    Grief reaction    Headache    High cholesterol    High risk medication use    Hypercholesteremia    IBS (irritable bowel syndrome)    Interstitial cystitis    Major depressive disorder, single episode, moderate (HCC)    Menopausal hot flushes    Mixed stress and urge urinary incontinence    Nephrolithiasis    Osteoporosis    Personal history of colonic polyps    Postmenopausal disorder    Sensorineural hearing loss (SNHL) of both ears 09/12/2017   Sleep disturbance    Smoker    Tinnitus, bilateral 09/12/2017   Vertigo 09/12/2017   Vitamin D deficiency      Social History   Tobacco Use   Smoking status: Every Day    Types: Cigarettes   Smokeless tobacco: Never   Tobacco comments:    1 1/2 - 2 pack a day  KRD 03/18/2024  Substance Use Topics   Alcohol use: No   Drug use: No    Audrey Hall reports that she has been smoking cigarettes. She has never used smokeless tobacco. She reports that she does not drink alcohol and does not use drugs.  Tobacco Cessation: Ready to quit: Not Answered Counseling given: Not Answered Tobacco comments: 1 1/2 - 2 pack a day  KRD 03/18/2024 Current  everyday smoker smoking 1/2 to 2 packs/day  Past surgical hx, Family hx, Social hx all reviewed.  Current Outpatient Medications on File Prior to Visit  Medication Sig   albuterol (VENTOLIN HFA) 108 (90 Base) MCG/ACT inhaler Inhale 1-2 puffs into the lungs every 4 (four) hours as needed for wheezing or shortness of breath.   ALPRAZolam (XANAX) 0.5 MG tablet Take 0.5-1 mg by mouth at bedtime as needed for anxiety.   aspirin EC 81 MG tablet Take 81 mg by mouth daily.  B Complex Vitamins (VITAMIN B COMPLEX) TABS Take 1 tablet by mouth daily.   Cholecalciferol (VITAMIN D3) 5000 UNITS CAPS Take 1 capsule by mouth daily.   estradiol (ESTRACE) 0.1 MG/GM vaginal cream Place 1 Applicatorful vaginally 2 (two) times a week.   fluticasone (FLONASE) 50 MCG/ACT nasal spray Place 1 spray into both nostrils daily.   lansoprazole (PREVACID) 30 MG capsule Take 30 mg by mouth daily.   loratadine (CLARITIN) 10 MG tablet Take 10 mg by mouth daily.   medroxyPROGESTERone (PROVERA) 2.5 MG tablet Take 2.5 mg by mouth daily.   melatonin 1 MG TABS tablet Take 1 mg by mouth at bedtime.   rosuvastatin (CRESTOR) 40 MG tablet Take 40 mg by mouth daily.   triamcinolone cream (KENALOG) 0.1 % Apply 1 Application topically.   VEOZAH 45 MG TABS Take 1 tablet by mouth daily.   metoprolol  tartrate (LOPRESSOR ) 100 MG tablet Take 1 tablet (100 mg total) by mouth once for 1 dose. Take 2 hours prior to your CT if your heart rate is greater than 55   nitroGLYCERIN  (NITROSTAT ) 0.4 MG SL tablet Place 1 tablet (0.4 mg total) under the tongue every 5 (five) minutes as needed.   No current facility-administered medications on file prior to visit.     Allergies  Allergen Reactions   Morphine      Other Reaction(s): Other (See Comments)  "Makes me mean."   Bupropion     Other Reaction(s): ER=bad dreams/thoughts   Citalopram Hydrobromide     Other Reaction(s): N, HA, felt strange   Pantoprazole  Sodium     Other Reaction(s): Did  not help    Review Of Systems:  Constitutional:   No  weight loss, night sweats,  Fevers, chills, fatigue, or  lassitude.  HEENT:   No headaches,  Difficulty swallowing,  Tooth/dental problems, or  Sore throat,                No sneezing, itching, ear ache, nasal congestion, post nasal drip,   CV:  No chest pain,  Orthopnea, PND, swelling in lower extremities, anasarca, dizziness, palpitations, syncope.   GI  No heartburn, indigestion, abdominal pain, nausea, vomiting, diarrhea, change in bowel habits, loss of appetite, bloody stools.   Resp: No shortness of breath with exertion or at rest.  No excess mucus, no productive cough,  No non-productive cough,  No coughing up of blood.  No change in color of mucus.  No wheezing.  No chest wall deformity  Skin: no rash or lesions.  GU: no dysuria, change in color of urine, no urgency or frequency.  No flank pain, no hematuria   MS:  No joint pain or swelling.  No decreased range of motion.  No back pain.  Psych:  No change in mood or affect. No depression or anxiety.  No memory loss.   Vital Signs BP 109/66 (BP Location: Left Arm, Patient Position: Sitting, Cuff Size: Normal)   Pulse 90   Ht 5\' 1"  (1.549 m)   Wt 145 lb 6.4 oz (66 kg)   SpO2 95%   BMI 27.47 kg/m    Physical Exam:  General- No distress,  A&Ox3, pleasant and appropriate ENT: No sinus tenderness, TM clear, pale nasal mucosa, no oral exudate,no post nasal drip, no LAN Cardiac: S1, S2, regular rate and rhythm, no murmur Chest: No wheeze/ rales/ dullness; no accessory muscle use, no nasal flaring, no sternal retractions Abd.: Soft Non-tender, nondistended, bowel sounds positive,Body mass index is 27.47 kg/m.  Ext: No clubbing cyanosis, edema, no obvious deformities Neuro:  normal strength, moving all extremities x 4, alert and oriented x 3, appropriate Skin: No rashes, warm and dry, no obvious skin lesions Psych: normal mood and  behavior   Assessment/Plan Pulmonary nodules with interval growth on low-dose CTs January 2024 in April 2024 PET scan done May 2024 shows interval decrease in size of both nodules with no significant PET avidity PET scan does show Mild focally accentuated activity is observed along the medial glandular tissues of the left breast, maximum SUV 3.7.  Plan Your PET scan did not show any uptake in the Right Upper Lobe nodule of concern. This is great news.  We will do a 3 month follow up CT Chest which will be due 06/06/2024 . You will get a call to get this scheduled closer to the date to schedule . You will follow up with me after the scan in the office to review the results.  Please follow up with your OB GYN regarding the left breast findings on the PET scan.  Call if you need us  sooner.  Please contact office for sooner follow up if symptoms do not improve or worsen or seek emergency care   You can receive free nicotine replacement therapy (patches, gum, or mints) by calling 1-800-QUIT NOW. Please call so we can get you on the path to becoming a non-smoker. I know it is hard, but you can do this!  Hypnosis for smoking cessation  [employer] [phone]  Acupuncture for smoking cessation  [employer] [phone]      I spent 30 minutes dedicated to the care of this patient on the date of this encounter to include pre-visit review of records, face-to-face time with the patient discussing conditions above, post visit ordering of testing, clinical documentation with the electronic health record, making appropriate referrals as documented, and communicating necessary information to the patient's healthcare team.     Raejean Bullock, NP 03/18/2024  10:33 AM

## 2024-04-03 ENCOUNTER — Other Ambulatory Visit: Payer: Self-pay | Admitting: Nurse Practitioner

## 2024-04-03 DIAGNOSIS — N643 Galactorrhea not associated with childbirth: Secondary | ICD-10-CM

## 2024-04-03 DIAGNOSIS — N644 Mastodynia: Secondary | ICD-10-CM

## 2024-04-17 ENCOUNTER — Ambulatory Visit

## 2024-05-06 ENCOUNTER — Other Ambulatory Visit

## 2024-05-06 ENCOUNTER — Ambulatory Visit
Admission: RE | Admit: 2024-05-06 | Discharge: 2024-05-06 | Disposition: A | Discharge status: Home / Self Care | Source: Ambulatory Visit | Attending: Nurse Practitioner | Admitting: Nurse Practitioner

## 2024-05-06 ENCOUNTER — Other Ambulatory Visit: Payer: Self-pay | Admitting: Nurse Practitioner

## 2024-05-06 DIAGNOSIS — N644 Mastodynia: Secondary | ICD-10-CM

## 2024-05-06 DIAGNOSIS — N643 Galactorrhea not associated with childbirth: Secondary | ICD-10-CM

## 2024-05-06 DIAGNOSIS — R928 Other abnormal and inconclusive findings on diagnostic imaging of breast: Secondary | ICD-10-CM

## 2024-05-06 DIAGNOSIS — N6320 Unspecified lump in the left breast, unspecified quadrant: Secondary | ICD-10-CM

## 2024-05-09 ENCOUNTER — Ambulatory Visit
Admission: RE | Admit: 2024-05-09 | Discharge: 2024-05-09 | Disposition: A | Discharge status: Home / Self Care | Source: Ambulatory Visit | Attending: Nurse Practitioner | Admitting: Nurse Practitioner

## 2024-05-09 DIAGNOSIS — R928 Other abnormal and inconclusive findings on diagnostic imaging of breast: Secondary | ICD-10-CM

## 2024-05-09 DIAGNOSIS — N6320 Unspecified lump in the left breast, unspecified quadrant: Secondary | ICD-10-CM

## 2024-05-09 HISTORY — PX: BREAST BIOPSY: SHX20

## 2024-05-12 LAB — SURGICAL PATHOLOGY

## 2024-05-26 ENCOUNTER — Ambulatory Visit (HOSPITAL_BASED_OUTPATIENT_CLINIC_OR_DEPARTMENT_OTHER)
Admission: RE | Admit: 2024-05-26 | Discharge: 2024-05-26 | Disposition: A | Discharge status: Home / Self Care | Source: Ambulatory Visit | Attending: Acute Care | Admitting: Acute Care

## 2024-05-26 DIAGNOSIS — R911 Solitary pulmonary nodule: Secondary | ICD-10-CM | POA: Insufficient documentation

## 2024-05-28 ENCOUNTER — Other Ambulatory Visit: Payer: Self-pay | Admitting: Nurse Practitioner

## 2024-05-28 DIAGNOSIS — N643 Galactorrhea not associated with childbirth: Secondary | ICD-10-CM

## 2024-06-13 ENCOUNTER — Encounter: Payer: Self-pay | Admitting: Acute Care

## 2024-06-13 ENCOUNTER — Ambulatory Visit (INDEPENDENT_AMBULATORY_CARE_PROVIDER_SITE_OTHER): Admitting: Acute Care

## 2024-06-13 ENCOUNTER — Telehealth: Payer: Self-pay | Admitting: Acute Care

## 2024-06-13 VITALS — BP 130/79 | HR 86 | Temp 98.3°F | Ht 61.0 in | Wt 142.0 lb

## 2024-06-13 DIAGNOSIS — R9389 Abnormal findings on diagnostic imaging of other specified body structures: Secondary | ICD-10-CM | POA: Diagnosis not present

## 2024-06-13 DIAGNOSIS — R911 Solitary pulmonary nodule: Secondary | ICD-10-CM | POA: Diagnosis not present

## 2024-06-13 DIAGNOSIS — R942 Abnormal results of pulmonary function studies: Secondary | ICD-10-CM | POA: Diagnosis not present

## 2024-06-13 DIAGNOSIS — F17210 Nicotine dependence, cigarettes, uncomplicated: Secondary | ICD-10-CM | POA: Diagnosis not present

## 2024-06-13 DIAGNOSIS — F17200 Nicotine dependence, unspecified, uncomplicated: Secondary | ICD-10-CM

## 2024-06-13 NOTE — Progress Notes (Signed)
 History of Present Illness Audrey Hall is a 73 y.o. female current every day smoker followed through the lung cancer screening program here for abnormal chest imaging . She will be followed by Dr. Shelah.     06/13/2024 Discussed the use of AI scribe software for clinical note transcription with the patient, who gave verbal consent to proceed.  History of Present Illness Pt. Presents to review follow up CT Chest. She had an abnormal lung cancer screening scan 02/2024. The scan was read as a LR 4 B, there had been growth of a 6.3 mm RUL nodule from 6.3 mm to 8.3 mm. PET scab was done 5.22.2025 to further evaluate the lung nodules. It showed a 5 mm RUL nodule that had a SUV of 1.3, a 4 mm LLL nodule negative for SUV, and an area along her left breast that had a SUV of 3.7. She has had a mammogram, and subsequent biopsy of that area, which was negative for malignancy. We planned on a 3 month follow up scan to continue to watch these nodules of concern. This was done 05/26/2024.   We have reviewed the most recent CT Chest.  It shows a lobulated nodule in the peripheral right upper lobe measuring 9 x 9 cm previously 7 x 7 cm.  Remaining suspicious for small lung malignancy.  The nodule in the left lower lobe remains 5 mm and has not changed.  The 6 mm nodule in the left upper lobe remains unchanged.  As the right upper lobe nodule has grown with each CT  initially measuring 5.3 mm in July 2024, increasing to 6.3 mm in January 2016, 8.3 mm in April 2017, and currently 9.9 mm, and the patient does continue to smoke, plan will be for navigational bronchoscopy with biopsies to further evaluate.  We discussed the option of watchful waiting versus biopsy now and patient and her husband opted for tissue sampling now.  We reviewed the risks of bleeding, infection, pneumothorax, and adverse reaction to anesthesia.  Both patient and her husband would like to move forward with the biopsy.   She has a significant  smoking history and has attempted to quit multiple times using methods such as patches, gum, and hypnosis. She successfully quit for eleven months but resumed smoking due to stress and anxiety. She wants to quit again and has tried using nicotine patches and lollipops to manage cravings.  She takes aspirin 81 mg daily. She has no known latex allergy but experienced a reaction to the adhesive of SteriStrips used during her breast biopsy.  No current symptoms of sleep apnea or diabetes. She recalls a past adverse reaction to general anesthesia, experiencing nausea post-procedure.     Test Results: 05/26/2024 CT Chest  Lobulated nodule in the peripheral right upper lobe measures 0.9 x 0.9 cm, previously 0.7 x 0.7 cm. This remains suspicious for a small lung malignancy. 2. Unchanged 0.5 cm nodule of the superior segment left lower lobe. Unchanged ground-glass nodule in the posterior left upper lobe measuring 0.6 cm. These are nonspecific and may be infectious or inflammatory. Continued attention on follow-up. 3. Emphysema and diffuse bilateral bronchial wall thickening. 4. Coronary artery disease.  PET scan 03/06/2024 A 5 mm right upper lobe nodule has a maximum standard uptake value of 1.3, although is below sensitive PET-CT size thresholds and accordingly indeterminate. 2. A 4 mm subpleural nodule in the superior segment left lower lobe has no perceptible associated accentuated metabolic activity but is below sensitive  PET-CT size thresholds. 3. Mild focally accentuated activity is observed along the medial glandular tissues of the left breast, maximum SUV 3.7. This is only mildly above the glandular tissue of the contralateral breast has a maximum SUV of 2.8. Accordingly I think it is reasonable that the patient follow her previously recommended diagnostic mammogram in July of 2025. 4. Airway thickening is present, suggesting bronchitis or reactive airways disease.   01/31/2024  LDCT Lung-RADS 4B, suspicious. 6.3 mm right upper lobe nodule of concern previously is now 8.3 mm   10/22/2023 Lung-RADS 4A, suspicious. 5.3 mm new nodule of concern in the right upper lobe previously shows mild interval progression in the interval measuring 6.3 mm toda    Latest Ref Rng & Units 09/25/2020    7:40 AM 05/24/2020    6:13 AM 02/27/2014    1:32 PM  CBC  WBC 4.0 - 10.5 K/uL 13.5  14.4    Hemoglobin 12.0 - 15.0 g/dL 85.2  85.0  85.3   Hematocrit 36.0 - 46.0 % 43.3  44.5  43.0   Platelets 150 - 400 K/uL 407  395         Latest Ref Rng & Units 07/17/2023    9:09 AM 07/06/2023   11:06 AM 09/25/2020    7:40 AM  BMP  Glucose 70 - 99 mg/dL 94  92  867   BUN 8 - 27 mg/dL 9  13  22    Creatinine 0.57 - 1.00 mg/dL 9.20  9.08  9.33   BUN/Creat Ratio 12 - 28 11  14     Sodium 134 - 144 mmol/L 141  141  135   Potassium 3.5 - 5.2 mmol/L 4.3  4.9  3.7   Chloride 96 - 106 mmol/L 105  103  103   CO2 20 - 29 mmol/L 21  22  22    Calcium  8.7 - 10.3 mg/dL 9.7  89.9  9.5     BNP No results found for: BNP  ProBNP No results found for: PROBNP  PFT No results found for: FEV1PRE, FEV1POST, FVCPRE, FVCPOST, TLC, DLCOUNC, PREFEV1FVCRT, PSTFEV1FVCRT  CT CHEST WO CONTRAST Result Date: 06/08/2024 CLINICAL DATA:  Follow-up lung nodule, recent breast biopsy * Tracking Code: BO * EXAM: CT CHEST WITHOUT CONTRAST TECHNIQUE: Multidetector CT imaging of the chest was performed following the standard protocol without IV contrast. RADIATION DOSE REDUCTION: This exam was performed according to the departmental dose-optimization program which includes automated exposure control, adjustment of the mA and/or kV according to patient size and/or use of iterative reconstruction technique. COMPARISON:  PET-CT, 03/06/2024, CT chest, 01/31/2024 FINDINGS: Cardiovascular: Aortic atherosclerosis. Normal heart size. Three-vessel coronary artery calcifications. No pericardial effusion.  Mediastinum/Nodes: No enlarged mediastinal, hilar, or axillary lymph nodes. Thyroid  gland, trachea, and esophagus demonstrate no significant findings. Lungs/Pleura: Lobulated nodule in the peripheral right upper lobe measures 0.9 x 0.9 cm, previously 0.7 x 0.7 cm (series 4, image 26). Unchanged 0.5 cm nodule of the superior segment left lower lobe (series 4, image 38). Unchanged ground-glass nodule in the posterior left upper lobe measuring 0.6 cm (series 4, image 26). Background of moderate centrilobular emphysema and diffuse bilateral bronchial wall thickening. No pleural effusion or pneumothorax. Upper Abdomen: No acute abnormality. Musculoskeletal: No chest wall abnormality. No acute osseous findings. IMPRESSION: 1. Lobulated nodule in the peripheral right upper lobe measures 0.9 x 0.9 cm, previously 0.7 x 0.7 cm. This remains suspicious for a small lung malignancy. 2. Unchanged 0.5 cm nodule of the superior segment left  lower lobe. Unchanged ground-glass nodule in the posterior left upper lobe measuring 0.6 cm. These are nonspecific and may be infectious or inflammatory. Continued attention on follow-up. 3. Emphysema and diffuse bilateral bronchial wall thickening. 4. Coronary artery disease. Aortic Atherosclerosis (ICD10-I70.0) and Emphysema (ICD10-J43.9). Electronically Signed   By: Marolyn JONETTA Jaksch M.D.   On: 06/08/2024 12:16     Past medical hx Past Medical History:  Diagnosis Date   Abdominal bloating    Abnormal CT scan    Allergic rhinitis    Anxiety    Aortic atherosclerosis (HCC)    BMI 28.0-28.9,adult    BPPV (benign paroxysmal positional vertigo), unspecified laterality 09/12/2017   Breast anomaly    COPD (chronic obstructive pulmonary disease) (HCC)    Full incontinence of feces    GERD without esophagitis    Grief reaction    Headache    High cholesterol    High risk medication use    Hypercholesteremia    IBS (irritable bowel syndrome)    Interstitial cystitis    Major  depressive disorder, single episode, moderate (HCC)    Menopausal hot flushes    Mixed stress and urge urinary incontinence    Nephrolithiasis    Osteoporosis    Personal history of colonic polyps    Postmenopausal disorder    Sensorineural hearing loss (SNHL) of both ears 09/12/2017   Sleep disturbance    Smoker    Tinnitus, bilateral 09/12/2017   Vertigo 09/12/2017   Vitamin D deficiency      Social History   Tobacco Use   Smoking status: Every Day    Types: Cigarettes   Smokeless tobacco: Never   Tobacco comments:    Started smoking at age 28. Smokes 2 packs a day  [facility] 06/13/2024  Substance Use Topics   Alcohol use: No   Drug use: No    Ms.Patch reports that she has been smoking cigarettes. She has never used smokeless tobacco. She reports that she does not drink alcohol and does not use drugs.  Tobacco Cessation: Ready to quit: Not Answered Counseling given: Not Answered Tobacco comments: Started smoking at age 39. Smokes 2 packs a day  [facility] 06/13/2024 Current everyday smoker with a 20+ pack year smoking history I spent 3-4 minutes counseling patient on  steps to stop use of tobacco products. I have provided patient with information on receiving free nicotine replacement therapy, and contact numbers for hypnosis for smoking cessation as well as acupuncture for smoking cessation.   Past surgical hx, Family hx, Social hx all reviewed.  Current Outpatient Medications on File Prior to Visit  Medication Sig   albuterol (VENTOLIN HFA) 108 (90 Base) MCG/ACT inhaler Inhale 1-2 puffs into the lungs every 4 (four) hours as needed for wheezing or shortness of breath.   ALPRAZolam (XANAX) 0.5 MG tablet Take 0.5-1 mg by mouth at bedtime as needed for anxiety.   aspirin EC 81 MG tablet Take 81 mg by mouth daily.   B Complex Vitamins (VITAMIN B COMPLEX) TABS Take 1 tablet by mouth daily.   Cholecalciferol (VITAMIN D3) 5000 UNITS CAPS Take 1 capsule by mouth daily.   estradiol  (ESTRACE) 0.1 MG/GM vaginal cream Place 1 Applicatorful vaginally 2 (two) times a week.   fluticasone (FLONASE) 50 MCG/ACT nasal spray Place 1 spray into both nostrils daily.   lansoprazole (PREVACID) 30 MG capsule Take 30 mg by mouth daily.   loratadine (CLARITIN) 10 MG tablet Take 10 mg by mouth daily.   melatonin  1 MG TABS tablet Take 1 mg by mouth at bedtime.   nitroGLYCERIN  (NITROSTAT ) 0.4 MG SL tablet Place 1 tablet (0.4 mg total) under the tongue every 5 (five) minutes as needed.   rosuvastatin (CRESTOR) 40 MG tablet Take 40 mg by mouth daily.   triamcinolone cream (KENALOG) 0.1 % Apply 1 Application topically.   medroxyPROGESTERone (PROVERA) 2.5 MG tablet Take 2.5 mg by mouth daily. (Patient not taking: Reported on 06/13/2024)   metoprolol  tartrate (LOPRESSOR ) 100 MG tablet Take 1 tablet (100 mg total) by mouth once for 1 dose. Take 2 hours prior to your CT if your heart rate is greater than 55 (Patient not taking: Reported on 06/13/2024)   VEOZAH 45 MG TABS Take 1 tablet by mouth daily. (Patient not taking: Reported on 06/13/2024)   No current facility-administered medications on file prior to visit.     Allergies  Allergen Reactions   Morphine      Other Reaction(s): Other (See Comments)  Makes me mean.   Bupropion     Other Reaction(s): ER=bad dreams/thoughts   Citalopram Hydrobromide     Other Reaction(s): N, HA, felt strange   Pantoprazole  Sodium     Other Reaction(s): Did not help    Review Of Systems:  Constitutional:   No  weight loss, night sweats,  Fevers, chills, fatigue, or  lassitude.  HEENT:   No headaches,  Difficulty swallowing,  Tooth/dental problems, or  Sore throat,                No sneezing, itching, ear ache, nasal congestion, post nasal drip,   CV:  No chest pain,  Orthopnea, PND, swelling in lower extremities, anasarca, dizziness, palpitations, syncope.   GI  No heartburn, indigestion, abdominal pain, nausea, vomiting, diarrhea, change in bowel  habits, loss of appetite, bloody stools.   Resp: + shortness of breath with exertion less at rest.  No excess mucus, no productive cough,  No non-productive cough,  No coughing up of blood.  No change in color of mucus.  No wheezing.  No chest wall deformity  Skin: no rash or lesions.  GU: no dysuria, change in color of urine, no urgency or frequency.  No flank pain, no hematuria   MS:  No joint pain or swelling.  No decreased range of motion.  No back pain.  Psych:  No change in mood or affect. No depression or anxiety.  No memory loss.   Vital Signs BP 130/79 (BP Location: Right Arm, Patient Position: Sitting, Cuff Size: Normal)   Pulse 86   Temp 98.3 F (36.8 C) (Oral)   Ht 5' 1 (1.549 m)   Wt 142 lb (64.4 kg)   SpO2 96%   BMI 26.83 kg/m    Physical Exam:  General- No distress,  A&Ox3, pleasant and appropriate ENT: No sinus tenderness, TM clear, pale nasal mucosa, no oral exudate,no post nasal drip, no LAN Cardiac: S1, S2, regular rate and rhythm, no murmur Chest: No wheeze/ rales/ dullness; no accessory muscle use, no nasal flaring, no sternal retractions, bilateral rhonchi clears with cough Abd.: Soft Non-tender, nondistended, bowel sounds positive,Body mass index is 26.83 kg/m.  Ext: No clubbing cyanosis, edema, no obvious deformities Neuro:  normal strength, moving all extremities x 4, alert and oriented x 3, appropriate Skin: No rashes, warm and dry, no obvious skin lesions Psych: normal mood and behavior   Assessment/Plan  Assessment and Plan Assessment & Plan Enlarging solitary pulmonary nodule, right upper lobe Nodule increased from  5.3 mm to 9.9 mm, raising malignancy concerns due to growth pattern and smoking history.  PET scan 02/2024 showed mild uptake, possibly affected by small nodule size. Current everyday smoker Plan - Schedule robotic-assisted navigational bronchoscopy with biopsy for June 24, 2024, at 11:00 AM at [facility]. - Instruct to  hold aspirin 81 mg two days prior to the procedure. - Discussed potential risks of the procedure, including bleeding, infection, pneumothorax, and adverse reactions to anesthesia. - Advised to inform the anesthesiologist about previous nausea with general anesthesia.  Tobacco use disorder Smoking increases malignancy risk in pulmonary nodule. Previous cessation attempts with nicotine patches and Wellbutrin failed due to stress and anxiety. She is aware of health risks and wants to quit. - Encourage use of nicotine patches and sugar-free lollipops for smoking cessation. - Discuss behavioral strategies to manage smoking triggers, such as keeping hands busy with activities like coloring or macrame. - Resources provided see AVS  Anxiety disorder Anxiety contributes to smoking habits. Previously used Xanax for anxiety, especially during medical procedures. - Discuss potential use of Zoloft for anxiety management and its additional benefit of reducing hot flashes.   AVS 06/13/2024  We have reviewed you most recent CT Chest. The right upper lobe nodule has increased in size over the last 3 years. We have discussed that the next best step is for biopsy. I have placed an order for a bronchoscopy with biopsies.  We have discussed the procedure in detail.  We have reviewed the risks and benefits of the procedure. These include bleeding, infection, puncture of the lung, and adverse reaction to anesthesia. You have agreed to proceed with biopsy to evaluate the right  upper lobe nodule. Your procedure will be done by Dr. Lamar Chris. You will receive a letter today with date time and information pertaining to the procedure. You will need someone to drive you to the procedure, stay with you during the procedure, and stay with you after the procedure. You will also need someone to stay with you for 24 hours after anesthesia to ensure you have cleared and are doing well. You will follow-up with me 1 week  after the procedure to review the results and to ensure you are doing well. Call if you need us  prior to the procedure or if you have any questions at all. Please contact office for sooner follow up if symptoms do not improve or worsen or seek emergency care     Hold your aspirin x 48 hours before procedure on 9/98/25 Please work on quitting smoking. You can receive free nicotine replacement therapy (patches, gum, or mints) by calling 1-800-QUIT NOW. Please call so we can get you on the path to becoming a non-smoker. I know it is hard, but you can do this!  Hypnosis for smoking cessation  [employer] [phone]  Acupuncture for smoking cessation  [employer] [phone]     I spent 35 minutes dedicated to the care of this patient on the date of this encounter to include pre-visit review of records, face-to-face time with the patient discussing conditions above, post visit ordering of testing, clinical documentation with the electronic health record, making appropriate referrals as documented, and communicating necessary information to the patient's healthcare team.   Lauraine JULIANNA Lites, NP 06/13/2024  10:06 AM

## 2024-06-13 NOTE — Telephone Encounter (Signed)
 Scheduled for 06/24/24 routing to [facility] for auth.

## 2024-06-13 NOTE — Telephone Encounter (Signed)
 Please schedule the following:  Provider performing procedure:[facility] Diagnosis: Growing lung nodule Which side if for nodule / mass?  Right Procedure: navigational bronchoscopy with biopsies  Has patient been spoken to by Provider and given informed consent?  Yes, Lauraine Lites NP on 8/29 Anesthesia:  General Do you need Fluro? yes Duration of procedure:  1.5 Date: 06/24/2024 Alternate Date: 9/10  Time: Wants the third case of the day, about 11 am Location: [facility] Does patient have OSA? No DM? No Or Latex allergy? No Medication Restriction/ Anticoagulate/Antiplatelet:  Hold aspirin 2 days prior to procedure Pre-op Labs Ordered:determined by Anesthesia Imaging request: Recent CT Chest  (If, SuperDimension CT Chest, please have STAT courier sent to ENDO)

## 2024-06-13 NOTE — Patient Instructions (Addendum)
 We have reviewed you most recent CT Chest. The right upper lobe nodule has increased in size over the last 3 years. We have discussed that the next best step is for biopsy. I have placed an order for a bronchoscopy with biopsies.  We have discussed the procedure in detail.  We have reviewed the risks and benefits of the procedure. These include bleeding, infection, puncture of the lung, and adverse reaction to anesthesia. You have agreed to proceed with biopsy to evaluate the right  upper lobe nodule. Your procedure will be done by Dr. Lamar Chris. You will receive a letter today with date time and information pertaining to the procedure. You will need someone to drive you to the procedure, stay with you during the procedure, and stay with you after the procedure. You will also need someone to stay with you for 24 hours after anesthesia to ensure you have cleared and are doing well. You will follow-up with me 1 week after the procedure to review the results and to ensure you are doing well. Call if you need us  prior to the procedure or if you have any questions at all. Please contact office for sooner follow up if symptoms do not improve or worsen or seek emergency care     Hold your aspirin x 48 hours before procedure on 9/98/25 Please work on quitting smoking. You can receive free nicotine replacement therapy (patches, gum, or mints) by calling 1-800-QUIT NOW. Please call so we can get you on the path to becoming a non-smoker. I know it is hard, but you can do this!  Hypnosis for smoking cessation  [employer] [phone]  Acupuncture for smoking cessation  [employer] [phone]

## 2024-06-13 NOTE — H&P (View-Only) (Signed)
 History of Present Illness Audrey Hall is a 73 y.o. female current every day smoker followed through the lung cancer screening program here for abnormal chest imaging . She will be followed by Dr. Shelah.     06/13/2024 Discussed the use of AI scribe software for clinical note transcription with the patient, who gave verbal consent to proceed.  History of Present Illness Pt. Presents to review follow up CT Chest. She had an abnormal lung cancer screening scan 02/2024. The scan was read as a LR 4 B, there had been growth of a 6.3 mm RUL nodule from 6.3 mm to 8.3 mm. PET scab was done 5.22.2025 to further evaluate the lung nodules. It showed a 5 mm RUL nodule that had a SUV of 1.3, a 4 mm LLL nodule negative for SUV, and an area along her left breast that had a SUV of 3.7. She has had a mammogram, and subsequent biopsy of that area, which was negative for malignancy. We planned on a 3 month follow up scan to continue to watch these nodules of concern. This was done 05/26/2024.   We have reviewed the most recent CT Chest.  It shows a lobulated nodule in the peripheral right upper lobe measuring 9 x 9 cm previously 7 x 7 cm.  Remaining suspicious for small lung malignancy.  The nodule in the left lower lobe remains 5 mm and has not changed.  The 6 mm nodule in the left upper lobe remains unchanged.  As the right upper lobe nodule has grown with each CT  initially measuring 5.3 mm in July 2024, increasing to 6.3 mm in January 2016, 8.3 mm in April 2017, and currently 9.9 mm, and the patient does continue to smoke, plan will be for navigational bronchoscopy with biopsies to further evaluate.  We discussed the option of watchful waiting versus biopsy now and patient and her husband opted for tissue sampling now.  We reviewed the risks of bleeding, infection, pneumothorax, and adverse reaction to anesthesia.  Both patient and her husband would like to move forward with the biopsy.   She has a significant  smoking history and has attempted to quit multiple times using methods such as patches, gum, and hypnosis. She successfully quit for eleven months but resumed smoking due to stress and anxiety. She wants to quit again and has tried using nicotine patches and lollipops to manage cravings.  She takes aspirin 81 mg daily. She has no known latex allergy but experienced a reaction to the adhesive of SteriStrips used during her breast biopsy.  No current symptoms of sleep apnea or diabetes. She recalls a past adverse reaction to general anesthesia, experiencing nausea post-procedure.     Test Results: 05/26/2024 CT Chest  Lobulated nodule in the peripheral right upper lobe measures 0.9 x 0.9 cm, previously 0.7 x 0.7 cm. This remains suspicious for a small lung malignancy. 2. Unchanged 0.5 cm nodule of the superior segment left lower lobe. Unchanged ground-glass nodule in the posterior left upper lobe measuring 0.6 cm. These are nonspecific and may be infectious or inflammatory. Continued attention on follow-up. 3. Emphysema and diffuse bilateral bronchial wall thickening. 4. Coronary artery disease.  PET scan 03/06/2024 A 5 mm right upper lobe nodule has a maximum standard uptake value of 1.3, although is below sensitive PET-CT size thresholds and accordingly indeterminate. 2. A 4 mm subpleural nodule in the superior segment left lower lobe has no perceptible associated accentuated metabolic activity but is below sensitive  PET-CT size thresholds. 3. Mild focally accentuated activity is observed along the medial glandular tissues of the left breast, maximum SUV 3.7. This is only mildly above the glandular tissue of the contralateral breast has a maximum SUV of 2.8. Accordingly I think it is reasonable that the patient follow her previously recommended diagnostic mammogram in July of 2025. 4. Airway thickening is present, suggesting bronchitis or reactive airways disease.   01/31/2024  LDCT Lung-RADS 4B, suspicious. 6.3 mm right upper lobe nodule of concern previously is now 8.3 mm   10/22/2023 Lung-RADS 4A, suspicious. 5.3 mm new nodule of concern in the right upper lobe previously shows mild interval progression in the interval measuring 6.3 mm toda    Latest Ref Rng & Units 09/25/2020    7:40 AM 05/24/2020    6:13 AM 02/27/2014    1:32 PM  CBC  WBC 4.0 - 10.5 K/uL 13.5  14.4    Hemoglobin 12.0 - 15.0 g/dL 85.2  85.0  85.3   Hematocrit 36.0 - 46.0 % 43.3  44.5  43.0   Platelets 150 - 400 K/uL 407  395         Latest Ref Rng & Units 07/17/2023    9:09 AM 07/06/2023   11:06 AM 09/25/2020    7:40 AM  BMP  Glucose 70 - 99 mg/dL 94  92  867   BUN 8 - 27 mg/dL 9  13  22    Creatinine 0.57 - 1.00 mg/dL 9.20  9.08  9.33   BUN/Creat Ratio 12 - 28 11  14     Sodium 134 - 144 mmol/L 141  141  135   Potassium 3.5 - 5.2 mmol/L 4.3  4.9  3.7   Chloride 96 - 106 mmol/L 105  103  103   CO2 20 - 29 mmol/L 21  22  22    Calcium  8.7 - 10.3 mg/dL 9.7  89.9  9.5     BNP No results found for: BNP  ProBNP No results found for: PROBNP  PFT No results found for: FEV1PRE, FEV1POST, FVCPRE, FVCPOST, TLC, DLCOUNC, PREFEV1FVCRT, PSTFEV1FVCRT  CT CHEST WO CONTRAST Result Date: 06/08/2024 CLINICAL DATA:  Follow-up lung nodule, recent breast biopsy * Tracking Code: BO * EXAM: CT CHEST WITHOUT CONTRAST TECHNIQUE: Multidetector CT imaging of the chest was performed following the standard protocol without IV contrast. RADIATION DOSE REDUCTION: This exam was performed according to the departmental dose-optimization program which includes automated exposure control, adjustment of the mA and/or kV according to patient size and/or use of iterative reconstruction technique. COMPARISON:  PET-CT, 03/06/2024, CT chest, 01/31/2024 FINDINGS: Cardiovascular: Aortic atherosclerosis. Normal heart size. Three-vessel coronary artery calcifications. No pericardial effusion.  Mediastinum/Nodes: No enlarged mediastinal, hilar, or axillary lymph nodes. Thyroid  gland, trachea, and esophagus demonstrate no significant findings. Lungs/Pleura: Lobulated nodule in the peripheral right upper lobe measures 0.9 x 0.9 cm, previously 0.7 x 0.7 cm (series 4, image 26). Unchanged 0.5 cm nodule of the superior segment left lower lobe (series 4, image 38). Unchanged ground-glass nodule in the posterior left upper lobe measuring 0.6 cm (series 4, image 26). Background of moderate centrilobular emphysema and diffuse bilateral bronchial wall thickening. No pleural effusion or pneumothorax. Upper Abdomen: No acute abnormality. Musculoskeletal: No chest wall abnormality. No acute osseous findings. IMPRESSION: 1. Lobulated nodule in the peripheral right upper lobe measures 0.9 x 0.9 cm, previously 0.7 x 0.7 cm. This remains suspicious for a small lung malignancy. 2. Unchanged 0.5 cm nodule of the superior segment left  lower lobe. Unchanged ground-glass nodule in the posterior left upper lobe measuring 0.6 cm. These are nonspecific and may be infectious or inflammatory. Continued attention on follow-up. 3. Emphysema and diffuse bilateral bronchial wall thickening. 4. Coronary artery disease. Aortic Atherosclerosis (ICD10-I70.0) and Emphysema (ICD10-J43.9). Electronically Signed   By: Marolyn JONETTA Jaksch M.D.   On: 06/08/2024 12:16     Past medical hx Past Medical History:  Diagnosis Date   Abdominal bloating    Abnormal CT scan    Allergic rhinitis    Anxiety    Aortic atherosclerosis (HCC)    BMI 28.0-28.9,adult    BPPV (benign paroxysmal positional vertigo), unspecified laterality 09/12/2017   Breast anomaly    COPD (chronic obstructive pulmonary disease) (HCC)    Full incontinence of feces    GERD without esophagitis    Grief reaction    Headache    High cholesterol    High risk medication use    Hypercholesteremia    IBS (irritable bowel syndrome)    Interstitial cystitis    Major  depressive disorder, single episode, moderate (HCC)    Menopausal hot flushes    Mixed stress and urge urinary incontinence    Nephrolithiasis    Osteoporosis    Personal history of colonic polyps    Postmenopausal disorder    Sensorineural hearing loss (SNHL) of both ears 09/12/2017   Sleep disturbance    Smoker    Tinnitus, bilateral 09/12/2017   Vertigo 09/12/2017   Vitamin D deficiency      Social History   Tobacco Use   Smoking status: Every Day    Types: Cigarettes   Smokeless tobacco: Never   Tobacco comments:    Started smoking at age 28. Smokes 2 packs a day  [facility] 06/13/2024  Substance Use Topics   Alcohol use: No   Drug use: No    Ms.Patch reports that she has been smoking cigarettes. She has never used smokeless tobacco. She reports that she does not drink alcohol and does not use drugs.  Tobacco Cessation: Ready to quit: Not Answered Counseling given: Not Answered Tobacco comments: Started smoking at age 39. Smokes 2 packs a day  [facility] 06/13/2024 Current everyday smoker with a 20+ pack year smoking history I spent 3-4 minutes counseling patient on  steps to stop use of tobacco products. I have provided patient with information on receiving free nicotine replacement therapy, and contact numbers for hypnosis for smoking cessation as well as acupuncture for smoking cessation.   Past surgical hx, Family hx, Social hx all reviewed.  Current Outpatient Medications on File Prior to Visit  Medication Sig   albuterol (VENTOLIN HFA) 108 (90 Base) MCG/ACT inhaler Inhale 1-2 puffs into the lungs every 4 (four) hours as needed for wheezing or shortness of breath.   ALPRAZolam (XANAX) 0.5 MG tablet Take 0.5-1 mg by mouth at bedtime as needed for anxiety.   aspirin EC 81 MG tablet Take 81 mg by mouth daily.   B Complex Vitamins (VITAMIN B COMPLEX) TABS Take 1 tablet by mouth daily.   Cholecalciferol (VITAMIN D3) 5000 UNITS CAPS Take 1 capsule by mouth daily.   estradiol  (ESTRACE) 0.1 MG/GM vaginal cream Place 1 Applicatorful vaginally 2 (two) times a week.   fluticasone (FLONASE) 50 MCG/ACT nasal spray Place 1 spray into both nostrils daily.   lansoprazole (PREVACID) 30 MG capsule Take 30 mg by mouth daily.   loratadine (CLARITIN) 10 MG tablet Take 10 mg by mouth daily.   melatonin  1 MG TABS tablet Take 1 mg by mouth at bedtime.   nitroGLYCERIN  (NITROSTAT ) 0.4 MG SL tablet Place 1 tablet (0.4 mg total) under the tongue every 5 (five) minutes as needed.   rosuvastatin (CRESTOR) 40 MG tablet Take 40 mg by mouth daily.   triamcinolone cream (KENALOG) 0.1 % Apply 1 Application topically.   medroxyPROGESTERone (PROVERA) 2.5 MG tablet Take 2.5 mg by mouth daily. (Patient not taking: Reported on 06/13/2024)   metoprolol  tartrate (LOPRESSOR ) 100 MG tablet Take 1 tablet (100 mg total) by mouth once for 1 dose. Take 2 hours prior to your CT if your heart rate is greater than 55 (Patient not taking: Reported on 06/13/2024)   VEOZAH 45 MG TABS Take 1 tablet by mouth daily. (Patient not taking: Reported on 06/13/2024)   No current facility-administered medications on file prior to visit.     Allergies  Allergen Reactions   Morphine      Other Reaction(s): Other (See Comments)  Makes me mean.   Bupropion     Other Reaction(s): ER=bad dreams/thoughts   Citalopram Hydrobromide     Other Reaction(s): N, HA, felt strange   Pantoprazole  Sodium     Other Reaction(s): Did not help    Review Of Systems:  Constitutional:   No  weight loss, night sweats,  Fevers, chills, fatigue, or  lassitude.  HEENT:   No headaches,  Difficulty swallowing,  Tooth/dental problems, or  Sore throat,                No sneezing, itching, ear ache, nasal congestion, post nasal drip,   CV:  No chest pain,  Orthopnea, PND, swelling in lower extremities, anasarca, dizziness, palpitations, syncope.   GI  No heartburn, indigestion, abdominal pain, nausea, vomiting, diarrhea, change in bowel  habits, loss of appetite, bloody stools.   Resp: + shortness of breath with exertion less at rest.  No excess mucus, no productive cough,  No non-productive cough,  No coughing up of blood.  No change in color of mucus.  No wheezing.  No chest wall deformity  Skin: no rash or lesions.  GU: no dysuria, change in color of urine, no urgency or frequency.  No flank pain, no hematuria   MS:  No joint pain or swelling.  No decreased range of motion.  No back pain.  Psych:  No change in mood or affect. No depression or anxiety.  No memory loss.   Vital Signs BP 130/79 (BP Location: Right Arm, Patient Position: Sitting, Cuff Size: Normal)   Pulse 86   Temp 98.3 F (36.8 C) (Oral)   Ht 5' 1 (1.549 m)   Wt 142 lb (64.4 kg)   SpO2 96%   BMI 26.83 kg/m    Physical Exam:  General- No distress,  A&Ox3, pleasant and appropriate ENT: No sinus tenderness, TM clear, pale nasal mucosa, no oral exudate,no post nasal drip, no LAN Cardiac: S1, S2, regular rate and rhythm, no murmur Chest: No wheeze/ rales/ dullness; no accessory muscle use, no nasal flaring, no sternal retractions, bilateral rhonchi clears with cough Abd.: Soft Non-tender, nondistended, bowel sounds positive,Body mass index is 26.83 kg/m.  Ext: No clubbing cyanosis, edema, no obvious deformities Neuro:  normal strength, moving all extremities x 4, alert and oriented x 3, appropriate Skin: No rashes, warm and dry, no obvious skin lesions Psych: normal mood and behavior   Assessment/Plan  Assessment and Plan Assessment & Plan Enlarging solitary pulmonary nodule, right upper lobe Nodule increased from  5.3 mm to 9.9 mm, raising malignancy concerns due to growth pattern and smoking history.  PET scan 02/2024 showed mild uptake, possibly affected by small nodule size. Current everyday smoker Plan - Schedule robotic-assisted navigational bronchoscopy with biopsy for June 24, 2024, at 11:00 AM at [facility]. - Instruct to  hold aspirin 81 mg two days prior to the procedure. - Discussed potential risks of the procedure, including bleeding, infection, pneumothorax, and adverse reactions to anesthesia. - Advised to inform the anesthesiologist about previous nausea with general anesthesia.  Tobacco use disorder Smoking increases malignancy risk in pulmonary nodule. Previous cessation attempts with nicotine patches and Wellbutrin failed due to stress and anxiety. She is aware of health risks and wants to quit. - Encourage use of nicotine patches and sugar-free lollipops for smoking cessation. - Discuss behavioral strategies to manage smoking triggers, such as keeping hands busy with activities like coloring or macrame. - Resources provided see AVS  Anxiety disorder Anxiety contributes to smoking habits. Previously used Xanax for anxiety, especially during medical procedures. - Discuss potential use of Zoloft for anxiety management and its additional benefit of reducing hot flashes.   AVS 06/13/2024  We have reviewed you most recent CT Chest. The right upper lobe nodule has increased in size over the last 3 years. We have discussed that the next best step is for biopsy. I have placed an order for a bronchoscopy with biopsies.  We have discussed the procedure in detail.  We have reviewed the risks and benefits of the procedure. These include bleeding, infection, puncture of the lung, and adverse reaction to anesthesia. You have agreed to proceed with biopsy to evaluate the right  upper lobe nodule. Your procedure will be done by Dr. Lamar Chris. You will receive a letter today with date time and information pertaining to the procedure. You will need someone to drive you to the procedure, stay with you during the procedure, and stay with you after the procedure. You will also need someone to stay with you for 24 hours after anesthesia to ensure you have cleared and are doing well. You will follow-up with me 1 week  after the procedure to review the results and to ensure you are doing well. Call if you need us  prior to the procedure or if you have any questions at all. Please contact office for sooner follow up if symptoms do not improve or worsen or seek emergency care     Hold your aspirin x 48 hours before procedure on 9/98/25 Please work on quitting smoking. You can receive free nicotine replacement therapy (patches, gum, or mints) by calling 1-800-QUIT NOW. Please call so we can get you on the path to becoming a non-smoker. I know it is hard, but you can do this!  Hypnosis for smoking cessation  [employer] [phone]  Acupuncture for smoking cessation  [employer] [phone]     I spent 35 minutes dedicated to the care of this patient on the date of this encounter to include pre-visit review of records, face-to-face time with the patient discussing conditions above, post visit ordering of testing, clinical documentation with the electronic health record, making appropriate referrals as documented, and communicating necessary information to the patient's healthcare team.   Lauraine JULIANNA Lites, NP 06/13/2024  10:06 AM

## 2024-06-20 ENCOUNTER — Other Ambulatory Visit: Payer: Self-pay

## 2024-06-20 ENCOUNTER — Encounter (HOSPITAL_COMMUNITY): Payer: Self-pay | Admitting: Emergency Medicine

## 2024-06-20 NOTE — Progress Notes (Signed)
 SDW CALL  Patient was given pre-op instructions over the phone. The opportunity was given for the patient to ask questions. No further questions asked. Patient verbalized understanding of instructions given.   PCP - [name], MD Cardiologist - Loni Soyla LABOR, MD   PPM/ICD - denies Device Orders -  Rep Notified -   Chest x-ray - CT 06/08/24 EKG - 07/04/23 Stress Test - denies ECHO - 08/06/23 Cardiac Cath - denies  Sleep Study - 12/11/23 CPAP - negative for sleep apnea  Fasting Blood Sugar - na Checks Blood Sugar _____ times a day  Blood Thinner Instructions:na Aspirin Instructions:per Dr. Lanny instructions,hold aspirin 48 hours prior to procedure. Last dose will be 9/6.  ERAS Protcol -no PRE-SURGERY Ensure or G2-   COVID TEST- na   Anesthesia review: yes-CAD,COPD,Smoker,aortic atherosclerosis  Patient denies shortness of breath, fever, cough and chest pain over the phone call   Oral Hygiene is also important to reduce your risk of infection.  Remember - BRUSH YOUR TEETH THE MORNING OF SURGERY WITH YOUR REGULAR TOOTHPASTE

## 2024-06-23 NOTE — Anesthesia Preprocedure Evaluation (Signed)
 Anesthesia Evaluation  Patient identified by MRN, date of birth, ID band Patient awake    Reviewed: Allergy & Precautions, H&P , NPO status , Patient's Chart, lab work & pertinent test results  History of Anesthesia Complications (+) PONV and history of anesthetic complications  Airway Mallampati: II   Neck ROM: full    Dental  (+) Dental Advisory Given   Pulmonary COPD, Current Smoker and Patient abstained from smoking.   breath sounds clear to auscultation       Cardiovascular + CAD   Rhythm:regular Rate:Normal  Echo 08/06/2023: IMPRESSIONS  1. Left ventricular ejection fraction, by estimation, is 60 to 65%. The  left ventricle has normal function. The left ventricle has no regional  wall motion abnormalities. Undetermined rhythm. PFO cannot be ruled out.  2. Right ventricular systolic function is normal. The right ventricular  size is normal.  3. The mitral valve is normal in structure. Mild mitral valve  regurgitation. No evidence of mitral stenosis.  4. The aortic valve is normal in structure. Aortic valve regurgitation is  not visualized. No aortic stenosis is present.  5. The inferior vena cava is normal in size with greater than 50%  respiratory variability, suggesting right atrial pressure of 3 mmHg.    CTA coronary 07/11/2023: IMPRESSION: 1. Mild CAD in the proximal and mid LAD, 25-49% stenosis, CADRADS 2. 2. Total plaque volume 674 mm3 which is 84th percentile for age- and sex-matched controls (calcified plaque 131 mm3; non-calcified plaque 543 mm3). TPV is severe. 3. Coronary calcium  score is 801, which places the patient in the 96th percentile for age and sex matched control. 4. Normal coronary origins with right dominance. 5. Small patent foramen ovale with left to right shunt.  RECOMMENDATIONS: CAD-RADS 2. Mild non-obstructive CAD (25-49%). Consider non-atherosclerotic causes of chest pain. Consider  preventive therapy and risk factor modification.    Neuro/Psych  Headaches  Anxiety Depression       GI/Hepatic ,GERD  ,,appendicitis   Endo/Other    Renal/GU      Musculoskeletal   Abdominal   Peds  Hematology   Anesthesia Other Findings   Reproductive/Obstetrics                              Anesthesia Physical Anesthesia Plan  ASA: 3  Anesthesia Plan: General   Post-op Pain Management:    Induction: Intravenous and Rapid sequence  PONV Risk Score and Plan: 3 and Ondansetron , Dexamethasone , Midazolam and Treatment may vary due to age or medical condition  Airway Management Planned: Oral ETT  Additional Equipment:   Intra-op Plan:   Post-operative Plan: Extubation in OR  Informed Consent: I have reviewed the patients History and Physical, chart, labs and discussed the procedure including the risks, benefits and alternatives for the proposed anesthesia with the patient or authorized representative who has indicated his/her understanding and acceptance.       Plan Discussed with: CRNA, Anesthesiologist and Surgeon  Anesthesia Plan Comments: (PAT note written 06/23/2024 by Kyarah Enamorado, PA-C.  )         Anesthesia Quick Evaluation

## 2024-06-23 NOTE — Progress Notes (Signed)
 Anesthesia Chart Review: SAME DAY WORK-UP  Case: [id number] Date/Time: 06/24/24 1145   Procedure: VIDEO BRONCHOSCOPY WITH ENDOBRONCHIAL NAVIGATION (Right) - right upper lobe   Anesthesia type: General   Diagnosis: Lung nodule seen on imaging study [R91.1]   Pre-op diagnosis: growing lung nodule   Location: [facility] / [facility]   Surgeons: Shelah Lamar RAMAN, MD       DISCUSSION: Patient is a 73 year old female scheduled for the above procedure.  She had an abnormal lung cancer screening chest CT. RUL nodule spacious on subsequent PET scan.  History includes smoking, postoperative N/V, COPD, hypercholesterolemia, GERD, aortic atherosclerosis, CAD (elevated CAD 801, 96th percentile, mild CD 07/11/2023 CCTA), PFO (small with left to right shunt 07/11/2023 CCTA, Dr. Edwyna recommended medical management 10/04/2023), IBS, vertigo/BPPV, interstitial cystitis.  She had a normal sleep study on 11/28/2023.  Last cardiology follow-up on 10/04/2023 with West, Katlyn, NP. Six month follow-up planned.   Last dose ASA planned for 06/21/2024.   Anesthesia team to evaluate on the day of surgery.  Updated labs arrival as indicated.   VS:  Wt Readings from Last 3 Encounters:  06/13/24 64.4 kg  03/18/24 66 kg  10/04/23 67.7 kg   BP Readings from Last 3 Encounters:  06/13/24 130/79  03/18/24 109/66  10/04/23 126/68   Pulse Readings from Last 3 Encounters:  06/13/24 86  03/18/24 90  10/04/23 89     PROVIDERS: [name], MD is PCP  Edwyna Backers, MD was cardiologist, but following 10/14/2023 APP visit, transfer to care will be to Acharya, Gayatri, MD Ruthell Lauraine Katheran Lamar, MD are pulmonology providers   LABS: For day of surgery as indicated. As of 07/17/2023, Cr 0.79, glucose 94.  IMAGES: Chest CT 05/26/2024: IMPRESSION: 1. Lobulated nodule in the peripheral right upper lobe measures 0.9 x 0.9 cm, previously 0.7 x 0.7 cm. This remains suspicious for  a small lung malignancy. 2. Unchanged 0.5 cm nodule of the superior segment left lower lobe. Unchanged ground-glass nodule in the posterior left upper lobe measuring 0.6 cm. These are nonspecific and may be infectious or inflammatory. Continued attention on follow-up. 3. Emphysema and diffuse bilateral bronchial wall thickening. 4. Coronary artery disease. - Aortic Atherosclerosis (ICD10-I70.0) and Emphysema (ICD10-J43.9).   PET scan 03/06/2024: IMPRESSION: 1. A 5 mm right upper lobe nodule has a maximum standard uptake value of 1.3, although is below sensitive PET-CT size thresholds and accordingly indeterminate. 2. A 4 mm subpleural nodule in the superior segment left lower lobe has no perceptible associated accentuated metabolic activity but is below sensitive PET-CT size thresholds. 3. Mild focally accentuated activity is observed along the medial glandular tissues of the left breast, maximum SUV 3.7. This is only mildly above the glandular tissue of the contralateral breast has a maximum SUV of 2.8. Accordingly I think it is reasonable that the patient follow her previously recommended diagnostic mammogram in July of 2025. 4. Airway thickening is present, suggesting bronchitis or reactive airways disease. 5. Aortic Atherosclerosis (ICD10-I70.0) and Emphysema (ICD10-J43.9).   EKG: 07/05/2023: Normal sinus rhythm Septal infarct , age undetermined When compared with ECG of 24-May-2020 08:08, PREVIOUS ECG IS PRESENT Confirmed by Edwyna Backers [phone]) on 07/04/2023 4:06:03 PM   CV: Echo 08/06/2023: IMPRESSIONS   1. Left ventricular ejection fraction, by estimation, is 60 to 65%. The  left ventricle has normal function. The left ventricle has no regional  wall motion abnormalities. Undetermined rhythm. PFO cannot be ruled out.   2. Right  ventricular systolic function is normal. The right ventricular  size is normal.   3. The mitral valve is normal in structure. Mild mitral  valve  regurgitation. No evidence of mitral stenosis.   4. The aortic valve is normal in structure. Aortic valve regurgitation is  not visualized. No aortic stenosis is present.   5. The inferior vena cava is normal in size with greater than 50%  respiratory variability, suggesting right atrial pressure of 3 mmHg.    CTA coronary 07/11/2023: IMPRESSION: 1. Mild CAD in the proximal and mid LAD, 25-49% stenosis, CADRADS 2. 2. Total plaque volume 674 mm3 which is 84th percentile for age- and sex-matched controls (calcified plaque 131 mm3; non-calcified plaque 543 mm3). TPV is severe. 3. Coronary calcium  score is 801, which places the patient in the 96th percentile for age and sex matched control. 4. Normal coronary origins with right dominance. 5. Small patent foramen ovale with left to right shunt.   RECOMMENDATIONS: CAD-RADS 2. Mild non-obstructive CAD (25-49%). Consider non-atherosclerotic causes of chest pain. Consider preventive therapy and risk factor modification.    Past Medical History:  Diagnosis Date   Abdominal bloating    Abnormal CT scan    Allergic rhinitis    Anxiety    Aortic atherosclerosis (HCC)    BMI 28.0-28.9,adult    BPPV (benign paroxysmal positional vertigo), unspecified laterality 09/12/2017   Breast anomaly    COPD (chronic obstructive pulmonary disease) (HCC)    Coronary artery disease    Full incontinence of feces    GERD without esophagitis    Grief reaction    Headache    High cholesterol    High risk medication use    Hypercholesteremia    IBS (irritable bowel syndrome)    Interstitial cystitis    Major depressive disorder, single episode, moderate (HCC)    Menopausal hot flushes    Mixed stress and urge urinary incontinence    Nephrolithiasis    Osteoporosis    Personal history of colonic polyps    PONV (postoperative nausea and vomiting)    Postmenopausal disorder    Sensorineural hearing loss (SNHL) of both ears 09/12/2017   Sleep  disturbance    Smoker    Tinnitus, bilateral 09/12/2017   Vertigo 09/12/2017   Vitamin D deficiency     Past Surgical History:  Procedure Laterality Date   BREAST BIOPSY Left 05/09/2024   US  LT BREAST BX W LOC DEV 1ST LESION IMG BX SPEC US  GUIDE 05/09/2024 [facility]   BREAST CYST ASPIRATION     BREAST EXCISIONAL BIOPSY Right    CHOLECYSTECTOMY     DILATION AND CURETTAGE OF UTERUS     EYE SURGERY     cataract   LAPAROSCOPIC APPENDECTOMY N/A 09/25/2020   Procedure: APPENDECTOMY LAPAROSCOPIC;  Surgeon: Ebbie Cough, MD;  Location: [facility];  Service: General;  Laterality: N/A;   TONSILLECTOMY     TUBAL LIGATION      MEDICATIONS: No current facility-administered medications for this encounter.    albuterol (VENTOLIN HFA) 108 (90 Base) MCG/ACT inhaler   ALPRAZolam (XANAX) 0.5 MG tablet   aspirin EC 81 MG tablet   B Complex Vitamins (VITAMIN B COMPLEX) TABS   Cholecalciferol (VITAMIN D3) 5000 UNITS CAPS   estradiol (ESTRACE) 0.1 MG/GM vaginal cream   fluticasone (FLONASE) 50 MCG/ACT nasal spray   lansoprazole (PREVACID) 30 MG capsule   loratadine (CLARITIN) 10 MG tablet   medroxyPROGESTERone (PROVERA) 2.5 MG tablet   melatonin 1 MG TABS tablet  metoprolol  tartrate (LOPRESSOR ) 100 MG tablet   nitroGLYCERIN  (NITROSTAT ) 0.4 MG SL tablet   rosuvastatin (CRESTOR) 40 MG tablet   triamcinolone cream (KENALOG) 0.1 %   VEOZAH 45 MG TABS    Isaiah Ruder, PA-C Surgical Short Stay/Anesthesiology [facility] Phone [phone] [facility] Phone [phone] 06/23/2024 12:18 PM

## 2024-06-24 ENCOUNTER — Encounter (HOSPITAL_COMMUNITY)
Admission: RE | Disposition: A | Discharge status: Home / Self Care | Payer: Self-pay | Source: Home / Self Care | Attending: Emergency Medicine

## 2024-06-24 ENCOUNTER — Ambulatory Visit (HOSPITAL_COMMUNITY)

## 2024-06-24 ENCOUNTER — Ambulatory Visit (HOSPITAL_COMMUNITY)
Admission: RE | Admit: 2024-06-24 | Discharge: 2024-06-24 | Disposition: A | Discharge status: Home / Self Care | Attending: Emergency Medicine | Admitting: Emergency Medicine

## 2024-06-24 ENCOUNTER — Encounter (HOSPITAL_COMMUNITY): Payer: Self-pay | Admitting: Emergency Medicine

## 2024-06-24 ENCOUNTER — Ambulatory Visit (HOSPITAL_BASED_OUTPATIENT_CLINIC_OR_DEPARTMENT_OTHER): Payer: Self-pay | Admitting: Vascular Surgery

## 2024-06-24 ENCOUNTER — Other Ambulatory Visit: Payer: Self-pay

## 2024-06-24 ENCOUNTER — Ambulatory Visit (HOSPITAL_COMMUNITY): Payer: Self-pay | Admitting: Vascular Surgery

## 2024-06-24 DIAGNOSIS — N3010 Interstitial cystitis (chronic) without hematuria: Secondary | ICD-10-CM | POA: Insufficient documentation

## 2024-06-24 DIAGNOSIS — I2510 Atherosclerotic heart disease of native coronary artery without angina pectoris: Secondary | ICD-10-CM | POA: Diagnosis not present

## 2024-06-24 DIAGNOSIS — F17210 Nicotine dependence, cigarettes, uncomplicated: Secondary | ICD-10-CM | POA: Insufficient documentation

## 2024-06-24 DIAGNOSIS — C3411 Malignant neoplasm of upper lobe, right bronchus or lung: Secondary | ICD-10-CM | POA: Insufficient documentation

## 2024-06-24 DIAGNOSIS — J449 Chronic obstructive pulmonary disease, unspecified: Secondary | ICD-10-CM | POA: Diagnosis not present

## 2024-06-24 DIAGNOSIS — K219 Gastro-esophageal reflux disease without esophagitis: Secondary | ICD-10-CM | POA: Insufficient documentation

## 2024-06-24 DIAGNOSIS — F418 Other specified anxiety disorders: Secondary | ICD-10-CM | POA: Insufficient documentation

## 2024-06-24 DIAGNOSIS — E7800 Pure hypercholesterolemia, unspecified: Secondary | ICD-10-CM | POA: Diagnosis not present

## 2024-06-24 DIAGNOSIS — K589 Irritable bowel syndrome without diarrhea: Secondary | ICD-10-CM | POA: Insufficient documentation

## 2024-06-24 DIAGNOSIS — I700 Atherosclerosis of aorta: Secondary | ICD-10-CM | POA: Diagnosis not present

## 2024-06-24 DIAGNOSIS — R911 Solitary pulmonary nodule: Secondary | ICD-10-CM | POA: Diagnosis present

## 2024-06-24 DIAGNOSIS — R918 Other nonspecific abnormal finding of lung field: Secondary | ICD-10-CM | POA: Diagnosis present

## 2024-06-24 HISTORY — DX: Atherosclerotic heart disease of native coronary artery without angina pectoris: I25.10

## 2024-06-24 HISTORY — DX: Nausea with vomiting, unspecified: R11.2

## 2024-06-24 HISTORY — DX: Other specified postprocedural states: Z98.890

## 2024-06-24 HISTORY — PX: VIDEO BRONCHOSCOPY WITH ENDOBRONCHIAL NAVIGATION: SHX6175

## 2024-06-24 LAB — POCT I-STAT, CHEM 8
BUN: 16 mg/dL (ref 8–23)
Calcium, Ion: 1.16 mmol/L (ref 1.15–1.40)
Chloride: 108 mmol/L (ref 98–111)
Creatinine, Ser: 0.9 mg/dL (ref 0.44–1.00)
Glucose, Bld: 90 mg/dL (ref 70–99)
HCT: 46 % (ref 36.0–46.0)
Hemoglobin: 15.6 g/dL — ABNORMAL HIGH (ref 12.0–15.0)
Potassium: 4.2 mmol/L (ref 3.5–5.1)
Sodium: 141 mmol/L (ref 135–145)
TCO2: 25 mmol/L (ref 22–32)

## 2024-06-24 SURGERY — VIDEO BRONCHOSCOPY WITH ENDOBRONCHIAL NAVIGATION
Anesthesia: General | Laterality: Right

## 2024-06-24 MED ORDER — ROCURONIUM BROMIDE 10 MG/ML (PF) SYRINGE
PREFILLED_SYRINGE | INTRAVENOUS | Status: DC | PRN
  Administered 2024-06-24: 10 mg via INTRAVENOUS
  Administered 2024-06-24: 50 mg via INTRAVENOUS

## 2024-06-24 MED ORDER — SUCCINYLCHOLINE CHLORIDE 200 MG/10ML IV SOSY
PREFILLED_SYRINGE | INTRAVENOUS | Status: DC | PRN
  Administered 2024-06-24: 120 mg via INTRAVENOUS

## 2024-06-24 MED ORDER — PROPOFOL 10 MG/ML IV BOLUS
INTRAVENOUS | Status: DC | PRN
  Administered 2024-06-24: 120 mg via INTRAVENOUS

## 2024-06-24 MED ORDER — CHLORHEXIDINE GLUCONATE 0.12 % MT SOLN
OROMUCOSAL | Status: AC
  Administered 2024-06-24: 15 mL via OROMUCOSAL
  Filled 2024-06-24: qty 15

## 2024-06-24 MED ORDER — CHLORHEXIDINE GLUCONATE 0.12 % MT SOLN
15.0000 mL | Freq: Once | OROMUCOSAL | Status: AC
Start: 2024-06-24 — End: 2024-06-24

## 2024-06-24 MED ORDER — SODIUM CHLORIDE 0.9 % IV SOLN
12.5000 mg | INTRAVENOUS | Status: DC | PRN
  Filled 2024-06-24: qty 0.5

## 2024-06-24 MED ORDER — PROPOFOL 500 MG/50ML IV EMUL
INTRAVENOUS | Status: DC | PRN
  Administered 2024-06-24: 115 ug/kg/min via INTRAVENOUS

## 2024-06-24 MED ORDER — LIDOCAINE 2% (20 MG/ML) 5 ML SYRINGE
INTRAMUSCULAR | Status: DC | PRN
  Administered 2024-06-24: 60 mg via INTRAVENOUS

## 2024-06-24 MED ORDER — HYDROMORPHONE HCL 1 MG/ML IJ SOLN: 0.2500 mg | INTRAMUSCULAR | Status: DC | PRN

## 2024-06-24 MED ORDER — PHENYLEPHRINE 80 MCG/ML (10ML) SYRINGE FOR IV PUSH (FOR BLOOD PRESSURE SUPPORT)
PREFILLED_SYRINGE | INTRAVENOUS | Status: DC | PRN
  Administered 2024-06-24: 160 ug via INTRAVENOUS
  Administered 2024-06-24: 80 ug via INTRAVENOUS
  Administered 2024-06-24: 160 ug via INTRAVENOUS

## 2024-06-24 MED ORDER — DEXAMETHASONE SODIUM PHOSPHATE 10 MG/ML IJ SOLN
INTRAMUSCULAR | Status: DC | PRN
  Administered 2024-06-24: 10 mg via INTRAVENOUS

## 2024-06-24 MED ORDER — SUGAMMADEX SODIUM 200 MG/2ML IV SOLN
INTRAVENOUS | Status: DC | PRN
  Administered 2024-06-24: 200 mg via INTRAVENOUS

## 2024-06-24 MED ORDER — AMISULPRIDE (ANTIEMETIC) 5 MG/2ML IV SOLN: 10.0000 mg | Freq: Once | INTRAVENOUS | Status: DC | PRN

## 2024-06-24 MED ORDER — OXYCODONE HCL 5 MG/5ML PO SOLN: 5.0000 mg | Freq: Once | ORAL | Status: DC | PRN

## 2024-06-24 MED ORDER — LACTATED RINGERS IV SOLN
INTRAVENOUS | Status: DC
Start: 2024-06-24 — End: 2024-06-24

## 2024-06-24 MED ORDER — OXYCODONE HCL 5 MG PO TABS: 5.0000 mg | ORAL_TABLET | Freq: Once | ORAL | Status: DC | PRN

## 2024-06-24 MED ORDER — FENTANYL CITRATE (PF) 100 MCG/2ML IJ SOLN
INTRAMUSCULAR | Status: AC
  Filled 2024-06-24: qty 2

## 2024-06-24 MED ORDER — ONDANSETRON HCL 4 MG/2ML IJ SOLN
INTRAMUSCULAR | Status: DC | PRN
  Administered 2024-06-24: 4 mg via INTRAVENOUS

## 2024-06-24 MED ORDER — FENTANYL CITRATE (PF) 250 MCG/5ML IJ SOLN
INTRAMUSCULAR | Status: DC | PRN
  Administered 2024-06-24: 100 ug via INTRAVENOUS

## 2024-06-24 SURGICAL SUPPLY — 37 items
ADAPTER BRONCHOSCOPE OLYMPUS (ADAPTER) ×2 IMPLANT
ADAPTER VALVE BIOPSY EBUS (MISCELLANEOUS) IMPLANT
BAG COUNTER SPONGE SURGICOUNT (BAG) ×2 IMPLANT
BRUSH CYTOL CELLEBRITY 1.5X140 (MISCELLANEOUS) ×2 IMPLANT
BRUSH SUPERTRAX BIOPSY (INSTRUMENTS) IMPLANT
BRUSH SUPERTRAX NDL-TIP CYTO (INSTRUMENTS) ×2 IMPLANT
CANISTER SUCTION 3000ML PPV (SUCTIONS) ×2 IMPLANT
CNTNR URN SCR LID CUP LEK RST (MISCELLANEOUS) ×2 IMPLANT
COVER BACK TABLE 60X90IN (DRAPES) ×2 IMPLANT
FILTER STRAW FLUID ASPIR (MISCELLANEOUS) IMPLANT
FORCEPS BIOP 1.5 SINGLE USE (MISCELLANEOUS) ×2 IMPLANT
FORCEPS BIOP SUPERTRX PREMAR (INSTRUMENTS) ×2 IMPLANT
GAUZE SPONGE 4X4 12PLY STRL (GAUZE/BANDAGES/DRESSINGS) ×2 IMPLANT
GLOVE BIO SURGEON STRL SZ7.5 (GLOVE) ×4 IMPLANT
GOWN STRL REUS W/ TWL LRG LVL3 (GOWN DISPOSABLE) ×4 IMPLANT
KIT CLEAN ENDO COMPLIANCE (KITS) ×2 IMPLANT
KIT LOCATABLE GUIDE (CANNULA) IMPLANT
KIT MARKER FIDUCIAL DELIVERY (KITS) IMPLANT
KIT TURNOVER KIT B (KITS) ×2 IMPLANT
MARKER SKIN DUAL TIP RULER LAB (MISCELLANEOUS) ×2 IMPLANT
NDL SUPERTRX PREMARK BIOPSY (NEEDLE) ×2 IMPLANT
NEEDLE SUPERTRX PREMARK BIOPSY (NEEDLE) ×1 IMPLANT
NS IRRIG 1000ML POUR BTL (IV SOLUTION) ×2 IMPLANT
OIL SILICONE PENTAX (PARTS (SERVICE/REPAIRS)) ×2 IMPLANT
PAD ARMBOARD POSITIONER FOAM (MISCELLANEOUS) ×4 IMPLANT
PATCHES PATIENT (LABEL) ×6 IMPLANT
SYR 20ML ECCENTRIC (SYRINGE) ×2 IMPLANT
SYR 20ML LL LF (SYRINGE) ×2 IMPLANT
SYR 50ML SLIP (SYRINGE) ×2 IMPLANT
TOWEL GREEN STERILE FF (TOWEL DISPOSABLE) ×2 IMPLANT
TRAP SPECIMEN MUCUS 40CC (MISCELLANEOUS) IMPLANT
TUBE CONNECTING 20X1/4 (TUBING) ×2 IMPLANT
UNDERPAD 30X36 HEAVY ABSORB (UNDERPADS AND DIAPERS) ×2 IMPLANT
VALVE BIOPSY SINGLE USE (MISCELLANEOUS) ×2 IMPLANT
VALVE SUCTION BRONCHIO DISP (MISCELLANEOUS) ×2 IMPLANT
WATER STERILE IRR 1000ML POUR (IV SOLUTION) ×2 IMPLANT
superlock fiducial marker IMPLANT

## 2024-06-24 NOTE — Discharge Instructions (Addendum)
 Flexible Bronchoscopy, Care After This sheet gives you information about how to care for yourself after your test. Your doctor may also give you more specific instructions. If you have problems or questions, contact your doctor. Follow these instructions at home: Eating and drinking When you are wide awake, your numbness is gone and your cough and gag reflexes have come back, you may: Start eating only soft foods. Slowly drink liquids. Six hours after the test, go back to your normal diet. Driving Do not drive for 24 hours if you were given a medicine to help you relax (sedative). Do not drive or use heavy machinery while taking prescription pain medicine. General instructions Take over-the-counter and prescription medicines only as told by your doctor. Return to your normal activities as told. Ask what activities are safe for you. Do not use any products that have nicotine or tobacco in them. This includes cigarettes and e-cigarettes. If you need help quitting, ask your doctor. Keep all follow-up visits as told by your doctor. This is important. It is very important if you had a tissue sample (biopsy) taken. Get help right away if: You have shortness of breath that gets worse. You get light-headed. You feel like you are going to pass out (faint). You have chest pain. You cough up: More than a little blood. More blood than before. Summary Do not use cigarettes. Do not use e-cigarettes. Seek care in the Emergency Department right away if you have chest pain or shortness of breath. Call or MyChart Message our office for any questions or problems at [phone].  Okay to restart aspirin on 06/25/2024   This information is not intended to replace advice given to you by your health care provider. Make sure you discuss any questions you have with your health care provider.

## 2024-06-24 NOTE — Transfer of Care (Signed)
 Immediate Anesthesia Transfer of Care Note  Patient: Audrey Hall  Procedure(s) Performed: VIDEO BRONCHOSCOPY WITH ENDOBRONCHIAL NAVIGATION (Right)  Patient Location: PACU  Anesthesia Type:General  Level of Consciousness: awake, alert , and oriented  Airway & Oxygen Therapy: Patient Spontanous Breathing and Patient connected to nasal cannula oxygen  Post-op Assessment: Report given to RN and Post -op Vital signs reviewed and stable  Post vital signs: Reviewed and stable  Last Vitals:  Vitals Value Taken Time  BP 105/60 06/24/24 12:16  Temp    Pulse 96 06/24/24 12:19  Resp 19 06/24/24 12:19  SpO2 92 % 06/24/24 12:19  Vitals shown include unfiled device data.  Last Pain:  Vitals:   06/24/24 0959  TempSrc:   PainSc: 0-No pain         Complications: No notable events documented.

## 2024-06-24 NOTE — Op Note (Signed)
 Procedure Note  Patient: Audrey Hall  [employer] Cios mobile C-arm was utilized to identify and biopsy right upper lobe nodule.  Needle-in-lesion was confirmed using real-time Cios imaging, and images were uploaded to PACS.     Lamar Chris, MD, PhD 06/24/2024, 12:43 PM [facility] [phone] or if no answer before 7:00PM call [phone] For any issues after 7:00PM please call eLink [phone]

## 2024-06-24 NOTE — Op Note (Signed)
 Video Bronchoscopy with Robotic Assisted Bronchoscopic Navigation   Date of Operation: 06/24/2024   Pre-op Diagnosis: Bilateral pulmonary nodules  Post-op Diagnosis: Same  Surgeon: Lamar Chris  Assistants: None  Anesthesia: General endotracheal anesthesia  Operation: Flexible video fiberoptic bronchoscopy with robotic assistance and biopsies.  Estimated Blood Loss: Minimal  Complications: None  Indications and History: Audrey Hall is a 73 y.o. female with history of tobacco use who particularly is in the lung cancer screening program.  She has a slowly enlarging right upper lobe pulmonary nodule (9 mm).  Also has stable pulmonary nodules in the left upper lobe, 1 of which is ground glass and 7 mm.  The solid nodule is 5 mm.  Recommendation made to achieve a tissue diagnosis via robotic assisted navigational bronchoscopy.  The risks, benefits, complications, treatment options and expected outcomes were discussed with the patient.  The possibilities of pneumothorax, pneumonia, reaction to medication, pulmonary aspiration, perforation of a viscus, bleeding, failure to diagnose a condition and creating a complication requiring transfusion or operation were discussed with the patient who freely signed the consent.    Description of Procedure: The patient was seen in the Preoperative Area, was examined and was deemed appropriate to proceed.  The patient was taken to [facility] Endoscopy room 3, identified as [name] and the procedure verified as Flexible Video Fiberoptic Bronchoscopy.  A Time Out was held and the above information confirmed.   Prior to the date of the procedure a high-resolution CT scan of the chest was performed. Utilizing ION software program a virtual tracheobronchial tree was generated to allow the creation of distinct navigation pathways to the patient's parenchymal abnormalities. After being taken to the operating room general anesthesia was initiated and the patient  was  orally intubated. The video fiberoptic bronchoscope was introduced via the endotracheal tube and a general inspection was performed which showed normal right and left lung anatomy. Aspiration of the bilateral mainstems was completed to remove any remaining secretions. Robotic catheter inserted into patient's endotracheal tube.   Target #1 right upper lobe pulmonary nodule: The distinct navigation pathways prepared prior to this procedure were then utilized to navigate to patient's lesion identified on CT scan. The robotic catheter was secured into place and the vision probe was withdrawn.  Lesion location was approximated using fluoroscopy.  Local registration and targeting was performed using [employer] Cios mobile C-arm three-dimensional imaging. Under fluoroscopic guidance transbronchial brushings, transbronchial needle biopsies, and transbronchial cryoprobe biopsies were performed to be sent for cytology and pathology.  Needle-in-lesion was confirmed using Cios mobile C-arm. Under fluoroscopic guidance a single fiducial marker was placed adjacent to the nodule.  Attempt was made to navigate to the left upper lobe ground glass pulmonary nodule but this was not easily accessible and no biopsies were performed at this location.  At the end of the procedure a general airway inspection was performed and there was no evidence of active bleeding. The bronchoscope was removed.  The patient tolerated the procedure well. There was no significant blood loss and there were no obvious complications. A post-procedural chest x-ray is pending.  Samples Target #1: 1. Transbronchial brushings from right upper lobe nodule 2. Transbronchial Wang needle biopsies from right upper lobe nodule 3. Transbronchial cryoprobe biopsies from right upper lobe nodule   Plans:  The patient will be discharged from the PACU to home when recovered from anesthesia and after chest x-ray is reviewed. We will review the  cytology, pathology and microbiology results with  the patient when they become available. Outpatient followup will be with GORMAN Lites, NP and Dr Shelah.    Lamar Shelah, MD, PhD 06/24/2024, 12:08 PM [facility] [phone] or if no answer before 7:00PM call [phone] For any issues after 7:00PM please call eLink [phone]

## 2024-06-24 NOTE — Anesthesia Postprocedure Evaluation (Signed)
 Anesthesia Post Note  Patient: Audrey Hall  Procedure(s) Performed: VIDEO BRONCHOSCOPY WITH ENDOBRONCHIAL NAVIGATION (Right)     Patient location during evaluation: PACU Anesthesia Type: General Level of consciousness: awake and alert Pain management: pain level controlled Vital Signs Assessment: post-procedure vital signs reviewed and stable Respiratory status: spontaneous breathing, nonlabored ventilation and respiratory function stable Cardiovascular status: blood pressure returned to baseline and stable Postop Assessment: no apparent nausea or vomiting Anesthetic complications: no   No notable events documented.  Last Vitals:  Vitals:   06/24/24 1311 06/24/24 1315  BP:  110/65  Pulse: 92 92  Resp: 20 20  Temp: 36.9 C 36.9 C  SpO2: 92% 93%    Last Pain:  Vitals:   06/24/24 1215  TempSrc:   PainSc: 0-No pain                 Butler Levander Pinal

## 2024-06-24 NOTE — Interval H&P Note (Signed)
 History and Physical Interval Note:  06/24/2024 10:19 AM  Audrey Hall  has presented today for surgery, with the diagnosis of growing lung nodule.  The various methods of treatment have been discussed with the patient and family. After consideration of risks, benefits and other options for treatment, the patient has consented to  Procedure(s) with comments: VIDEO BRONCHOSCOPY WITH ENDOBRONCHIAL NAVIGATION (Right) - right upper lobe, left upper lobe, as a surgical intervention.  The patient's history has been reviewed, patient examined, no change in status, stable for surgery.  I have reviewed the patient's chart and labs.  Questions were answered to the patient's satisfaction.     Lamar GORMAN Chris

## 2024-06-24 NOTE — Anesthesia Procedure Notes (Signed)
 Procedure Name: Intubation Date/Time: 06/24/2024 11:06 AM  Performed by: Vera Rochele PARAS, CRNAPre-anesthesia Checklist: Patient identified, Emergency Drugs available, Suction available and Patient being monitored Patient Re-evaluated:Patient Re-evaluated prior to induction Oxygen Delivery Method: Circle System Utilized Preoxygenation: Pre-oxygenation with 100% oxygen Induction Type: IV induction Ventilation: Mask ventilation without difficulty Laryngoscope Size: Miller and 2 Grade View: Grade I Tube type: Oral Tube size: 8.5 mm Number of attempts: 1 Airway Equipment and Method: Stylet and Oral airway Placement Confirmation: ETT inserted through vocal cords under direct vision, positive ETCO2 and breath sounds checked- equal and bilateral Secured at: 21 cm Tube secured with: Tape Dental Injury: Teeth and Oropharynx as per pre-operative assessment

## 2024-06-25 ENCOUNTER — Encounter (HOSPITAL_COMMUNITY): Payer: Self-pay | Admitting: Emergency Medicine

## 2024-07-01 LAB — CYTOLOGY - NON PAP

## 2024-07-04 ENCOUNTER — Ambulatory Visit: Admitting: Acute Care

## 2024-07-04 ENCOUNTER — Encounter: Payer: Self-pay | Admitting: Acute Care

## 2024-07-04 VITALS — BP 128/68 | HR 78 | Temp 98.0°F | Ht 61.0 in | Wt 143.6 lb

## 2024-07-04 DIAGNOSIS — J449 Chronic obstructive pulmonary disease, unspecified: Secondary | ICD-10-CM | POA: Diagnosis not present

## 2024-07-04 DIAGNOSIS — C3411 Malignant neoplasm of upper lobe, right bronchus or lung: Secondary | ICD-10-CM | POA: Diagnosis not present

## 2024-07-04 DIAGNOSIS — Z9889 Other specified postprocedural states: Secondary | ICD-10-CM

## 2024-07-04 DIAGNOSIS — R911 Solitary pulmonary nodule: Secondary | ICD-10-CM

## 2024-07-04 DIAGNOSIS — F17210 Nicotine dependence, cigarettes, uncomplicated: Secondary | ICD-10-CM | POA: Diagnosis not present

## 2024-07-04 DIAGNOSIS — R942 Abnormal results of pulmonary function studies: Secondary | ICD-10-CM

## 2024-07-04 DIAGNOSIS — C3490 Malignant neoplasm of unspecified part of unspecified bronchus or lung: Secondary | ICD-10-CM

## 2024-07-04 DIAGNOSIS — Z720 Tobacco use: Secondary | ICD-10-CM

## 2024-07-04 DIAGNOSIS — R9389 Abnormal findings on diagnostic imaging of other specified body structures: Secondary | ICD-10-CM

## 2024-07-04 NOTE — Patient Instructions (Addendum)
 It is good to see you today. I am glad you have done well after your bronchoscopy with biopsies. We have reviewed your cytology results which did show a non-small cell lung cancer most likely adenocarcinoma. We have discussed options for treatment to include surgery, and radiation therapy. We need to do an MRI of the brain to complete staging. This has been ordered and you will get a phone call to get it scheduled. We also need to do pulmonary function testing to ensure you are a candidate for surgery. We will try and schedule these today before you leave. I have referred you to thoracic surgery as well as radiation oncology. You will get phone calls to get consult scheduled. Most likely you will not get a call from thoracic surgery until the MRI brain and the pulmonary function testing have been completed and they are ensure that you are a good surgical candidate. Please work hard on quitting smoking. We discussed continued working on reduced to quit. Use your nicotine patches as you have been doing, and add sugar-free cinnamon hard candies to help. Goal is no more than 1 cigarette/h to get you down to a half a pack per day. I know this is hard. But this is for the greater good of your health. Good luck with treatment. These providers are going to take great care of you. Call if you need anything Please contact office for sooner follow up if symptoms do not improve or worsen or seek emergency care    You can receive free nicotine replacement therapy (patches, gum, or mints) by calling 1-800-QUIT NOW. Please call so we can get you on the path to becoming a non-smoker. I know it is hard, but you can do this!  Hypnosis for smoking cessation  [employer] [phone]  Acupuncture for smoking cessation  [employer] [phone]

## 2024-07-04 NOTE — Progress Notes (Signed)
 History of Present Illness Audrey Hall is a 73 y.o. female current every day smoker followed through the lung cancer screening program here for abnormal chest imaging . She will be followed by Dr. Shelah.     07/04/2024 Discussed the use of AI scribe software for clinical note transcription with the patient, who gave verbal consent to proceed.  History of Present Illness Audrey Hall is a 73 year old female who presents post bronchoscopy with biopsies to ensure she is doing well and to get cytology reporting. She states she has done well since the procedure.  No bleeding, fever, discolored secretions, worsening dyspnea, or adverse reaction to anesthesia.  She states she did have some sleepiness after the procedure which is expected with general anesthesia. We have reviewed her cytology results.  The biopsy of the right upper lobe pulmonary nodule was positive for non-small cell cancer.  Per the comment it appears to have markers consistent with adenocarcinoma.  While this is not the news the patient had hoped for today she understands that this appears to be an early diagnosis.  I presented options of surgery and radiation oncology as options for treatment.  I believe patient does prefer the consideration of surgery.  She is a current everyday smoker, smokes 2 packs/day.  She has not had pulmonary function testing.  I did explain to her that we would need to do pulmonary function tests to determine whether or not she was a surgical candidate.  Additionally I explained we needed to complete staging with an MRI of the brain to ensure that the 2 nodules in her chest are the only nodules to be concerned about.  PET scan done 03/04/2024 confirmed the 5 mm right upper lobe nodule that we know is consistent with adenocarcinoma, and an additional 4 mm subpleural nodule in the left lower lobe that was below sensitivity for PET scan.  She has had a breast biopsy of the area in the left breast that also  showed SUV uptake on PET.  I explained that options may be surgical removal of the right upper lobe nodule, and continued monitoring of the left lower lobe nodule versus consideration of SBRT.  I have placed referrals to both thoracic surgery and radiation oncology.  I have ordered pulmonary function testing and MRI brain to complete staging.   She has a history of COPD and uses inhalers on a PRN basis. She does not have a maintenance inhaler.  We may need to add maintenance inhaler once we get the results of pulmonary function testing.  She is a smoker, currently smoking two packs a day. She has previously used Wellbutrin and nicotine patches to quit smoking. I have counseled the patient extensively on smoking cessation for approximately 5 minutes this visit.  Patient is working on reduced to quit.  She does not want to try Wellbutrin again.  I have suggested she set a goal of 1 cigarette an hour to get her down to less than 1 pack/day, while using nicotine patches and sugar-free hard candy to help with the oral fixation.  Patient understands that is possible that she is not a surgical candidate in which case she will be seen by radiation oncology.  Patient is here today with her husband and neither had any additional questions at completion of the office visit.  Again I am reassured that this is early stage lung cancer.     Test Results: Cytology 06/24/2024 A. LUNG, RUL, NEEDLE ASPIRATION  BIOPSIES:  -  Malignant cells present  - Non-small cell carcinoma  - See comment   B. LUNG, RUL, BRUSHING:  - No malignant cells identified  - Macrophages   Surgical Pathology 05/09/2024   1. Breast, left, needle core biopsy, 10:00 2cmfn 8mm (coil clip) :       BENIGN BREAST SHOWING INFLAMMATORY CHANGES COMPATIBLE WITH A RUPTURED DUCT/DUCT       ECTASIA       FRAGMENT OF DUCT/CYST WALL       MICROCALCIFICATIONS PRESENT WITHIN VESSEL WALL       NEGATIVE FOR ATYPIA AND CARCINOMA   Immunohistochemical  stains (CK5/6, CD56 and TTF-1) for  characterization of the tumor were suboptimal due to very limited number  of tumor cells present on the deeper levels but may favor  adenocarcinoma.  Dr. LeGolvan reviewed the case and concurs with the  above diagnosis.  Dr. Shelah was notified on 07/01/2024.     Latest Ref Rng & Units 06/24/2024   10:26 AM 09/25/2020    7:40 AM 05/24/2020    6:13 AM  CBC  WBC 4.0 - 10.5 K/uL  13.5  14.4   Hemoglobin 12.0 - 15.0 g/dL 84.3  85.2  85.0   Hematocrit 36.0 - 46.0 % 46.0  43.3  44.5   Platelets 150 - 400 K/uL  407  395        Latest Ref Rng & Units 06/24/2024   10:26 AM 07/17/2023    9:09 AM 07/06/2023   11:06 AM  BMP  Glucose 70 - 99 mg/dL 90  94  92   BUN 8 - 23 mg/dL 16  9  13    Creatinine 0.44 - 1.00 mg/dL 9.09  9.20  9.08   BUN/Creat Ratio 12 - 28  11  14    Sodium 135 - 145 mmol/L 141  141  141   Potassium 3.5 - 5.1 mmol/L 4.2  4.3  4.9   Chloride 98 - 111 mmol/L 108  105  103   CO2 20 - 29 mmol/L  21  22   Calcium  8.7 - 10.3 mg/dL  9.7  89.9     BNP No results found for: BNP  ProBNP No results found for: PROBNP  PFT No results found for: FEV1PRE, FEV1POST, FVCPRE, FVCPOST, TLC, DLCOUNC, PREFEV1FVCRT, PSTFEV1FVCRT  DG Chest Port 1 View Result Date: 06/24/2024 CLINICAL DATA:  [id number] S/P bronchoscopy with biopsy [id number] EXAM: PORTABLE CHEST - 1 VIEW COMPARISON:  Feb 27, 2014 FINDINGS: The small pulmonary nodules noted on prior CT are not well visualized by plain radiography. Fiducial marker in the right upper lung zone. No focal airspace consolidation, pleural effusion, or pneumothorax. No cardiomegaly. Aortic atherosclerosis. No acute fracture or destructive lesions. Multilevel thoracic osteophytosis. IMPRESSION: 1. No acute cardiopulmonary abnormality.  No pneumothorax. 2. The small pulmonary nodules noted on the prior CT are not as well visualized by plain radiography. Fiducial marker noted in the right upper lung zone.  Electronically Signed   By: Rogelia Myers M.D.   On: 06/24/2024 13:07   DG C-ARM BRONCHOSCOPY Result Date: 06/24/2024 C-ARM BRONCHOSCOPY: Fluoroscopy was utilized by the requesting physician.  No radiographic interpretation.   CT SUPER D CHEST WO CONTRAST Result Date: 06/24/2024 CLINICAL DATA:  Enlarging pulmonary nodule. Bronchoscopy planning. History of benign breast biopsy. EXAM: CT CHEST WITHOUT CONTRAST TECHNIQUE: Multidetector CT imaging of the chest was performed using thin slice collimation for electromagnetic bronchoscopy planning purposes, without intravenous contrast. RADIATION DOSE REDUCTION: This exam was performed according to  the departmental dose-optimization program which includes automated exposure control, adjustment of the mA and/or kV according to patient size and/or use of iterative reconstruction technique. COMPARISON:  Chest CT 05/26/2024 and 01/31/2024.  PET-CT 03/06/2024 FINDINGS: Cardiovascular: Atherosclerosis of the aorta, great vessels and coronary arteries. The heart size is normal. There is no pericardial effusion. Mediastinum/Nodes: There are no enlarged mediastinal, hilar, axillary or internal mammary lymph nodes. Hilar assessment is limited by the lack of intravenous contrast, although the hilar contours appear unchanged. The thyroid  gland, trachea and esophagus demonstrate no significant findings. Lungs/Pleura: No pleural effusion or pneumothorax. Moderate centrilobular emphysema. The part solid right upper lobe nodule persists and does not appear significantly changed, measuring approximately 8 x 7 mm on image 26/4. No significant change in ground-glass nodule posteriorly in the left upper lobe, measuring 8 mm on image 35/4 unchanged solid 5 mm nodule in the superior segment of the left lower lobe on image 45/4. No new or enlarging nodules are identified. There is mild dependent atelectasis in both lungs. Upper abdomen: The visualized upper abdomen appears stable post  cholecystectomy. No acute findings. Musculoskeletal/Chest wall: There is no chest wall mass or suspicious osseous finding. IMPRESSION: 1. Imaging for bronchoscopy planning and guidance. 2. No significant change in part solid right upper lobe nodule, ground-glass left upper lobe nodule and solid left lower lobe nodule compared with recent prior chest CT. 3. No new or enlarging nodules identified. 4. No adenopathy or pleural effusion. 5. Aortic Atherosclerosis (ICD10-I70.0) and Emphysema (ICD10-J43.9). Electronically Signed   By: Elsie Perone M.D.   On: 06/24/2024 11:40     Past medical hx Past Medical History:  Diagnosis Date   Abdominal bloating    Abnormal CT scan    Allergic rhinitis    Anxiety    Aortic atherosclerosis (HCC)    BMI 28.0-28.9,adult    BPPV (benign paroxysmal positional vertigo), unspecified laterality 09/12/2017   Breast anomaly    COPD (chronic obstructive pulmonary disease) (HCC)    Coronary artery disease    Full incontinence of feces    GERD without esophagitis    Grief reaction    Headache    High cholesterol    High risk medication use    Hypercholesteremia    IBS (irritable bowel syndrome)    Interstitial cystitis    Major depressive disorder, single episode, moderate (HCC)    Menopausal hot flushes    Mixed stress and urge urinary incontinence    Nephrolithiasis    Osteoporosis    Personal history of colonic polyps    PONV (postoperative nausea and vomiting)    Postmenopausal disorder    Sensorineural hearing loss (SNHL) of both ears 09/12/2017   Sleep disturbance    Smoker    Tinnitus, bilateral 09/12/2017   Vertigo 09/12/2017   Vitamin D deficiency      Social History   Tobacco Use   Smoking status: Every Day    Types: Cigarettes   Smokeless tobacco: Never   Tobacco comments:    Started smoking at age 47. Smokes 2 packs a day KRD 07/04/2024  Vaping Use   Vaping status: Some Days  Substance Use Topics   Alcohol use: No   Drug use: No     Ms.Solar reports that she has been smoking cigarettes. She has never used smokeless tobacco. She reports that she does not drink alcohol and does not use drugs.  Tobacco Cessation: Ready to quit: Not Answered Counseling given: Not Answered Tobacco comments: Started smoking  at age 64. Smokes 2 packs a day KRD 07/04/2024 Current everyday smoker smokes 2 packs/day Counseled extensively for 5 minutes on ways to reduce to quit.  Patient has also been provided resources to help her quit see AVS  Past surgical hx, Family hx, Social hx all reviewed.  Current Outpatient Medications on File Prior to Visit  Medication Sig   albuterol (VENTOLIN HFA) 108 (90 Base) MCG/ACT inhaler Inhale 1-2 puffs into the lungs every 4 (four) hours as needed for wheezing or shortness of breath.   ALPRAZolam (XANAX) 0.5 MG tablet Take 0.5-1 mg by mouth at bedtime as needed for anxiety.   aspirin EC 81 MG tablet Take 81 mg by mouth daily.   B Complex Vitamins (VITAMIN B COMPLEX) TABS Take 1 tablet by mouth daily.   Black Cohosh 160 MG CAPS 1 capsule.   Cholecalciferol (VITAMIN D3) 5000 UNITS CAPS Take 1 capsule by mouth daily.   estradiol (ESTRACE) 0.1 MG/GM vaginal cream Place 1 Applicatorful vaginally 2 (two) times a week.   fluticasone (FLONASE) 50 MCG/ACT nasal spray Place 1 spray into both nostrils daily.   lansoprazole (PREVACID) 30 MG capsule Take 30 mg by mouth daily.   loratadine (CLARITIN) 10 MG tablet Take 10 mg by mouth daily.   melatonin 1 MG TABS tablet Take 1 mg by mouth at bedtime.   nitroGLYCERIN  (NITROSTAT ) 0.4 MG SL tablet Place 1 tablet (0.4 mg total) under the tongue every 5 (five) minutes as needed.   rosuvastatin (CRESTOR) 40 MG tablet Take 40 mg by mouth daily.   triamcinolone cream (KENALOG) 0.1 % Apply 1 Application topically.   No current facility-administered medications on file prior to visit.     Allergies  Allergen Reactions   Morphine      Other Reaction(s): Other (See  Comments)  Makes me mean.   Bupropion     Other Reaction(s): ER=bad dreams/thoughts   Citalopram Hydrobromide     Other Reaction(s): N, HA, felt strange   Estradiol Other (See Comments)   Fezolinetant Other (See Comments)   Pantoprazole  Sodium     Other Reaction(s): Did not help    Review Of Systems:  Constitutional:   No  weight loss, night sweats,  Fevers, chills, fatigue, or  lassitude.  HEENT:   No headaches,  Difficulty swallowing,  Tooth/dental problems, or  Sore throat,                No sneezing, itching, ear ache, nasal congestion, post nasal drip,   CV:  No chest pain,  Orthopnea, PND, swelling in lower extremities, anasarca, dizziness, palpitations, syncope.   GI  No heartburn, indigestion, abdominal pain, nausea, vomiting, diarrhea, change in bowel habits, loss of appetite, bloody stools.   Resp: + shortness of breath with exertion or at rest.  + excess mucus, + productive cough,  No non-productive cough,  No coughing up of blood.  No change in color of mucus.  No wheezing.  No chest wall deformity  Skin: no rash or lesions.  GU: no dysuria, change in color of urine, no urgency or frequency.  No flank pain, no hematuria   MS:  No joint pain or swelling.  No decreased range of motion.  No back pain.  Psych:  No change in mood or affect. No depression or anxiety.  No memory loss.   Vital Signs BP 128/68   Pulse 78   Temp 98 F (36.7 C) (Oral)   Ht 5' 1 (1.549 m)   Wt  143 lb 9.6 oz (65.1 kg)   SpO2 98%   BMI 27.13 kg/m    Physical Exam:  General- No distress,  A&Ox3, pleasant ENT: No sinus tenderness, TM clear, pale nasal mucosa, no oral exudate,no post nasal drip, no LAN Cardiac: S1, S2, regular rate and rhythm, no murmur Chest: No wheeze/ rales/ dullness; no accessory muscle use, no nasal flaring, no sternal retractions, diminished per bases Abd.: Soft Non-tender, ND, BS +, Body mass index is 27.13 kg/m.  Ext: No clubbing cyanosis, edema, no  obvious deformities Neuro:  normal strength, MAE x 4, A&O x 3, appropriate Skin: No rashes, warm and dry, No obvious skin lesions Psych: normal mood and behavior    Assessment & Plan Non-small cell lung cancer, right upper lobe Post Bronchoscopy with Biopsies - Order MRI of the brain to complete staging. - Order pulmonary function tests to assess surgical candidacy. - Refer to thoracic surgery for evaluation of surgical removal of the nodule. - Refer to radiation oncology for evaluation of radiation therapy options.  Pulmonary nodule, left lower lobe (under surveillance) 4 mm nodule in left lower lobe with low metabolic activity.  - Refer to radiation oncology for evaluation of the left lower lobe nodule. - Consider continued surveillance vs radiation therapy based on specialist recommendations.  Chronic obstructive pulmonary disease (COPD) COPD managed with PRN inhalers.  Pulmonary function tests needed to evaluate lung function and surgical candidacy.  Smoking cessation and increased physical activity recommended to improve pulmonary function. - Order pulmonary function tests. - Encourage smoking cessation to improve lung function. - Counseled x 5 minutes on smoking cessation  - Advise on increasing physical activity and walking to enhance pulmonary function.  Nicotine dependence, cigarettes Current smoker with history of two packs per day.  Smoking cessation critical, especially with potential surgery.  Options include nicotine patches and behavioral strategies. Wellbutrin previously caused adverse effects. - Encourage reduction of smoking to one cigarette per hour. - Advise use of nicotine patches and sugar-free hard candy to manage cravings. - Advise against vaping as an alternative to smoking.  I spent 40 minutes dedicated to the care of this patient on the date of this encounter to include pre-visit review of records, face-to-face time with the patient discussing  conditions above, post visit ordering of testing, clinical documentation with the electronic health record, making appropriate referrals as documented, and communicating necessary information to the patient's healthcare team.    07/04/2024 AVD I am glad you have done well after your bronchoscopy with biopsies. We have reviewed your cytology results which did show a non-small cell lung cancer most likely adenocarcinoma. We have discussed options for treatment to include surgery, and radiation therapy. We need to do an MRI of the brain to complete staging. This has been ordered and you will get a phone call to get it scheduled. We also need to do pulmonary function testing to ensure you are a candidate for surgery. We will try and schedule these today before you leave. I have referred you to thoracic surgery as well as radiation oncology. You will get phone calls to get consult scheduled. Most likely you will not get a call from thoracic surgery until the MRI brain and the pulmonary function testing have been completed and they are ensure that you are a good surgical candidate. Please work hard on quitting smoking. We discussed continued working on reduced to quit. Use your nicotine patches as you have been doing, and add sugar-free cinnamon hard candies to  help. Goal is no more than 1 cigarette/h to get you down to a half a pack per day. I know this is hard. But this is for the greater good of your health. Good luck with treatment. These providers are going to take great care of you. Call if you need anything Please contact office for sooner follow up if symptoms do not improve or worsen or seek emergency care    You can receive free nicotine replacement therapy (patches, gum, or mints) by calling 1-800-QUIT NOW. Please call so we can get you on the path to becoming a non-smoker. I know it is hard, but you can do this!  Hypnosis for smoking cessation  [employer] [phone]  Acupuncture for smoking cessation  [facility] [phone]     Lauraine JULIANNA Lites, NP 07/04/2024  9:03 AM

## 2024-07-09 NOTE — Progress Notes (Signed)
 Thoracic Location of Tumor / Histology:  Bilateral Pulmonary Nodules  Patient presented {numbers 1-12:19994} months ago with symptoms of: ***  Biopsies revealed:    Tobacco/Marijuana/Snuff/ETOH use: Tobacco use  Past/Anticipated interventions by pulmonology if any:  Flexible Video Fiberoptic Bronchoscopy with Robotic Assistance and Biopsies  Past/Anticipated interventions by medical oncology, if any: {:18581}  Signs/Symptoms Weight changes, if any: {:18581} Respiratory complaints, if any: {:18581} Hemoptysis, if any: {:18581} Pain issues, if any:  {:18581}  SAFETY ISSUES: Prior radiation? {:18581} Pacemaker/ICD? {:18581}  Possible current pregnancy?{:18581} Is the patient on methotrexate? {:18581}  Current Complaints / other details:  ***

## 2024-07-10 ENCOUNTER — Other Ambulatory Visit: Payer: Self-pay

## 2024-07-10 NOTE — Progress Notes (Signed)
 Radiation Oncology         [phone] ________________________________  Initial Outpatient Consultation  Name: Audrey Hall MRN: [id number]  Date: 07/11/2024  DOB: [birth year]  RR:Mndd, Carlin Redbird, MD  Shelah Lamar RAMAN, MD   REFERRING PHYSICIAN: Shelah Lamar RAMAN, MD  DIAGNOSIS: No diagnosis found.   Cancer Staging  No matching staging information was found for the patient.  Non-small cell carcinoma of the right upper lung   CHIEF COMPLAINT: Here to discuss management of right lung cancer  HISTORY OF PRESENT ILLNESS::Audrey Hall is a 73 y.o. female current smoker with an extensive 71 pack year smoking history. She participates in the lung cancer screening program at [facility] and is followed by Dr. Shelah.   She presented for a lung cancer screening chest CT on 05/01/2023 which showed a new RUL pulmonary nodule measuring 5.3 mm in mean volume derived diameter. This was favored to represent a benign etiology at that time. However, her next lung cancer screening CT on 0/16/25 showed an interval increase in size of the RUL nodule to 6.3 mm.   Follow-up imaging was recommended and she accordingly presented for a chest CT on 01/31/24 that showed a further interval increase in size of the RUL nodule to 8.3 mm.  A PET can was subsequently performed on 03/06/24 which demonstrated: maximum standard uptake associated with a 5 mm pulmonary nodule in the RUL (characterized as indeterminate given its size being below sensitive PET-CT size thresholds). PET findings also consisted of a 4 mm subpleural nodule in the superior segment left lower lobe without associated accentuated metabolic activity, mild focally accentuated activity along the medial glandular tissues of the left breast (warranting further evaluation as noted below), and airway thickening suggestive of bronchitis vs reactive airways disease.   A chest CT without contrast was then performed on 05/26/24 which demonstrated:  an interval increase in size of the RUL nodule, measuring 9 mm (previously 7 mm) concerning for malignancy. The nonspecific superior and posterior segment LLL nodules otherwise appeared stable, measuring 5 mm and 6 mm (respectively)   A super-D chest CT was also performed on 06/24/24 which demonstrated no significant change of the part solid right upper lobe nodule or of the ground-glass left upper lobe nodule and solid left lower lobe nodule. Overall, no new nodules or evidence of lymphadenopathy were demonstrated.   She evaluated by Dr. Shelah and opted to proceed with a bronchoscopy on 06/24/24. FNA of the RUL collected at that time showed findings consistent with non-small cell carcinoma. (Brushing of the RUL showed no evidence of malignancy).    She will need to have an MRI of the brain performed in light of her non-small cell histology (MRI scheduled for 07/16/24). Dr. Shelah has also ordered PFT's to assess her surgical candidacy. With regards to radiation therapy, Dr Shelah would like her to be considered for radiation therapy to both the RUL and LLL nodule which we will discuss in detail today.    She has also been referred to cardiothoracic surgery and is scheduled to meet with Dr. Kerrin in consultation on 07/14/24.   Of note: Based on her PET findings showing some activity in the left breast, she presented for a bilateral diagnostic mammogram and left breast ultrasound on 05/06/24 which showed an indeterminate mass in the 10 o'clock left breast measuring 8 mm in the greatest extent. The mass was biopsied on 05/09/24 and showed no evidence of malignancy, with benign findings consisting of inflammatory  changes compatible with a ruptured duct vs duct ectasia fragment.   PREVIOUS RADIATION THERAPY: No  PAST MEDICAL HISTORY:  has a past medical history of Abdominal bloating, Abnormal CT scan, Allergic rhinitis, Anxiety, Aortic atherosclerosis, BMI 28.0-28.9,adult, BPPV (benign paroxysmal  positional vertigo), unspecified laterality (09/12/2017), Breast anomaly, COPD (chronic obstructive pulmonary disease) (HCC), Coronary artery disease, Full incontinence of feces, GERD without esophagitis, Grief reaction, Headache, High cholesterol, High risk medication use, Hypercholesteremia, IBS (irritable bowel syndrome), Interstitial cystitis, Major depressive disorder, single episode, moderate (HCC), Menopausal hot flushes, Mixed stress and urge urinary incontinence, Nephrolithiasis, Osteoporosis, Personal history of colonic polyps, PONV (postoperative nausea and vomiting), Postmenopausal disorder, Sensorineural hearing loss (SNHL) of both ears (09/12/2017), Sleep disturbance, Smoker, Tinnitus, bilateral (09/12/2017), Vertigo (09/12/2017), and Vitamin D deficiency.    PAST SURGICAL HISTORY: Past Surgical History:  Procedure Laterality Date   BREAST BIOPSY Left 05/09/2024   US  LT BREAST BX W LOC DEV 1ST LESION IMG BX SPEC US  GUIDE 05/09/2024 [facility]   BREAST CYST ASPIRATION     BREAST EXCISIONAL BIOPSY Right    CHOLECYSTECTOMY     DILATION AND CURETTAGE OF UTERUS     EYE SURGERY     cataract   LAPAROSCOPIC APPENDECTOMY N/A 09/25/2020   Procedure: APPENDECTOMY LAPAROSCOPIC;  Surgeon: Ebbie Cough, MD;  Location: [facility];  Service: General;  Laterality: N/A;   TONSILLECTOMY     TUBAL LIGATION     VIDEO BRONCHOSCOPY WITH ENDOBRONCHIAL NAVIGATION Right 06/24/2024   Procedure: VIDEO BRONCHOSCOPY WITH ENDOBRONCHIAL NAVIGATION;  Surgeon: Shelah Lamar RAMAN, MD;  Location: [facility];  Service: Pulmonary;  Laterality: Right;  right upper lobe    FAMILY HISTORY: family history includes Breast cancer in her maternal aunt and mother; Breast cancer (age of onset: 86) in her sister.  SOCIAL HISTORY:  reports that she has been smoking cigarettes. She has never used smokeless tobacco. She reports that she does not drink alcohol and does not use drugs.  ALLERGIES: Morphine , Bupropion,  Citalopram hydrobromide, Estradiol, Fezolinetant, and Pantoprazole  sodium  MEDICATIONS:  Current Outpatient Medications  Medication Sig Dispense Refill   albuterol (VENTOLIN HFA) 108 (90 Base) MCG/ACT inhaler Inhale 1-2 puffs into the lungs every 4 (four) hours as needed for wheezing or shortness of breath.     ALPRAZolam (XANAX) 0.5 MG tablet Take 0.5-1 mg by mouth at bedtime as needed for anxiety.     aspirin EC 81 MG tablet Take 81 mg by mouth daily.     B Complex Vitamins (VITAMIN B COMPLEX) TABS Take 1 tablet by mouth daily.     Black Cohosh 160 MG CAPS 1 capsule.     Cholecalciferol (VITAMIN D3) 5000 UNITS CAPS Take 1 capsule by mouth daily.     estradiol (ESTRACE) 0.1 MG/GM vaginal cream Place 1 Applicatorful vaginally 2 (two) times a week.     fluticasone (FLONASE) 50 MCG/ACT nasal spray Place 1 spray into both nostrils daily.     lansoprazole (PREVACID) 30 MG capsule Take 30 mg by mouth daily.     loratadine (CLARITIN) 10 MG tablet Take 10 mg by mouth daily.     melatonin 1 MG TABS tablet Take 1 mg by mouth at bedtime.     nitroGLYCERIN  (NITROSTAT ) 0.4 MG SL tablet Place 1 tablet (0.4 mg total) under the tongue every 5 (five) minutes as needed. 25 tablet 6   rosuvastatin (CRESTOR) 40 MG tablet Take 40 mg by mouth daily.     triamcinolone cream (KENALOG) 0.1 % Apply 1  Application topically.     No current facility-administered medications for this encounter.    REVIEW OF SYSTEMS:  Notable for that above.   PHYSICAL EXAM:  vitals were not taken for this visit.   General: Alert and oriented, in no acute distress *** HEENT: Head is normocephalic. Extraocular movements are intact. Oropharynx is clear. Neck: Neck is supple, no palpable cervical or supraclavicular lymphadenopathy. Heart: Regular in rate and rhythm with no murmurs, rubs, or gallops. Chest: Clear to auscultation bilaterally, with no rhonchi, wheezes, or rales. Abdomen: Soft, nontender, nondistended, with no rigidity or  guarding. Extremities: No cyanosis or edema. Lymphatics: see Neck Exam Skin: No concerning lesions. Musculoskeletal: symmetric strength and muscle tone throughout. Neurologic: Cranial nerves II through XII are grossly intact. No obvious focalities. Speech is fluent. Coordination is intact. Psychiatric: Judgment and insight are intact. Affect is appropriate.   ECOG = ***  0 - Asymptomatic (Fully active, able to carry on all predisease activities without restriction)  1 - Symptomatic but completely ambulatory (Restricted in physically strenuous activity but ambulatory and able to carry out work of a light or sedentary nature. For example, light housework, office work)  2 - Symptomatic, <50% in bed during the day (Ambulatory and capable of all self care but unable to carry out any work activities. Up and about more than 50% of waking hours)  3 - Symptomatic, >50% in bed, but not bedbound (Capable of only limited self-care, confined to bed or chair 50% or more of waking hours)  4 - Bedbound (Completely disabled. Cannot carry on any self-care. Totally confined to bed or chair)  5 - Death   Raylene MM, Creech RH, Tormey DC, et al. [phone]). Toxicity and response criteria of the [facility]. Am. DOROTHA Bridges. Oncol. 5 (6): 649-55   LABORATORY DATA:  Lab Results  Component Value Date   WBC 13.5 (H) 09/25/2020   HGB 15.6 (H) 06/24/2024   HCT 46.0 06/24/2024   MCV 90.6 09/25/2020   PLT 407 (H) 09/25/2020   CMP     Component Value Date/Time   NA 141 06/24/2024 1026   NA 141 07/17/2023 0909   K 4.2 06/24/2024 1026   CL 108 06/24/2024 1026   CO2 21 07/17/2023 0909   GLUCOSE 90 06/24/2024 1026   BUN 16 06/24/2024 1026   BUN 9 07/17/2023 0909   CREATININE 0.90 06/24/2024 1026   CALCIUM  9.7 07/17/2023 0909   PROT 6.9 12/07/2023 1138   ALBUMIN 4.7 12/07/2023 1138   AST 31 12/07/2023 1138   ALT 23 12/07/2023 1138   ALKPHOS 76 12/07/2023 1138   BILITOT 0.5 12/07/2023  1138   EGFR 80 07/17/2023 0909   GFRNONAA >60 09/25/2020 0740         RADIOGRAPHY: DG Chest Port 1 View Result Date: 06/24/2024 CLINICAL DATA:  [id number] S/P bronchoscopy with biopsy [id number] EXAM: PORTABLE CHEST - 1 VIEW COMPARISON:  Feb 27, 2014 FINDINGS: The small pulmonary nodules noted on prior CT are not well visualized by plain radiography. Fiducial marker in the right upper lung zone. No focal airspace consolidation, pleural effusion, or pneumothorax. No cardiomegaly. Aortic atherosclerosis. No acute fracture or destructive lesions. Multilevel thoracic osteophytosis. IMPRESSION: 1. No acute cardiopulmonary abnormality.  No pneumothorax. 2. The small pulmonary nodules noted on the prior CT are not as well visualized by plain radiography. Fiducial marker noted in the right upper lung zone. Electronically Signed   By: Rogelia Myers M.D.   On: 06/24/2024 13:07  DG C-ARM BRONCHOSCOPY Result Date: 06/24/2024 C-ARM BRONCHOSCOPY: Fluoroscopy was utilized by the requesting physician.  No radiographic interpretation.   CT SUPER D CHEST WO CONTRAST Result Date: 06/24/2024 CLINICAL DATA:  Enlarging pulmonary nodule. Bronchoscopy planning. History of benign breast biopsy. EXAM: CT CHEST WITHOUT CONTRAST TECHNIQUE: Multidetector CT imaging of the chest was performed using thin slice collimation for electromagnetic bronchoscopy planning purposes, without intravenous contrast. RADIATION DOSE REDUCTION: This exam was performed according to the departmental dose-optimization program which includes automated exposure control, adjustment of the mA and/or kV according to patient size and/or use of iterative reconstruction technique. COMPARISON:  Chest CT 05/26/2024 and 01/31/2024.  PET-CT 03/06/2024 FINDINGS: Cardiovascular: Atherosclerosis of the aorta, great vessels and coronary arteries. The heart size is normal. There is no pericardial effusion. Mediastinum/Nodes: There are no enlarged mediastinal, hilar, axillary  or internal mammary lymph nodes. Hilar assessment is limited by the lack of intravenous contrast, although the hilar contours appear unchanged. The thyroid  gland, trachea and esophagus demonstrate no significant findings. Lungs/Pleura: No pleural effusion or pneumothorax. Moderate centrilobular emphysema. The part solid right upper lobe nodule persists and does not appear significantly changed, measuring approximately 8 x 7 mm on image 26/4. No significant change in ground-glass nodule posteriorly in the left upper lobe, measuring 8 mm on image 35/4 unchanged solid 5 mm nodule in the superior segment of the left lower lobe on image 45/4. No new or enlarging nodules are identified. There is mild dependent atelectasis in both lungs. Upper abdomen: The visualized upper abdomen appears stable post cholecystectomy. No acute findings. Musculoskeletal/Chest wall: There is no chest wall mass or suspicious osseous finding. IMPRESSION: 1. Imaging for bronchoscopy planning and guidance. 2. No significant change in part solid right upper lobe nodule, ground-glass left upper lobe nodule and solid left lower lobe nodule compared with recent prior chest CT. 3. No new or enlarging nodules identified. 4. No adenopathy or pleural effusion. 5. Aortic Atherosclerosis (ICD10-I70.0) and Emphysema (ICD10-J43.9). Electronically Signed   By: Elsie Perone M.D.   On: 06/24/2024 11:40      IMPRESSION/PLAN:***    On date of service, in total, I spent *** minutes on this encounter. Patient was seen in person.   __________________________________________   Lauraine Golden, MD  This document serves as a record of services personally performed by Lauraine Golden, MD. It was created on her behalf by Dorthy Fuse, a trained medical scribe. The creation of this record is based on the scribe's personal observations and the provider's statements to them. This document has been checked and approved by the attending provider.

## 2024-07-11 ENCOUNTER — Ambulatory Visit
Admission: RE | Admit: 2024-07-11 | Discharge: 2024-07-11 | Disposition: A | Discharge status: Home / Self Care | Source: Ambulatory Visit | Attending: Radiation Oncology | Admitting: Radiation Oncology

## 2024-07-11 ENCOUNTER — Ambulatory Visit: Admitting: *Deleted

## 2024-07-11 ENCOUNTER — Encounter: Payer: Self-pay | Admitting: Radiation Oncology

## 2024-07-11 DIAGNOSIS — J432 Centrilobular emphysema: Secondary | ICD-10-CM | POA: Insufficient documentation

## 2024-07-11 DIAGNOSIS — R911 Solitary pulmonary nodule: Secondary | ICD-10-CM

## 2024-07-11 DIAGNOSIS — I2510 Atherosclerotic heart disease of native coronary artery without angina pectoris: Secondary | ICD-10-CM | POA: Insufficient documentation

## 2024-07-11 DIAGNOSIS — C3411 Malignant neoplasm of upper lobe, right bronchus or lung: Secondary | ICD-10-CM | POA: Insufficient documentation

## 2024-07-11 DIAGNOSIS — K589 Irritable bowel syndrome without diarrhea: Secondary | ICD-10-CM | POA: Diagnosis not present

## 2024-07-11 DIAGNOSIS — Z79899 Other long term (current) drug therapy: Secondary | ICD-10-CM | POA: Diagnosis not present

## 2024-07-11 DIAGNOSIS — C3490 Malignant neoplasm of unspecified part of unspecified bronchus or lung: Secondary | ICD-10-CM | POA: Diagnosis not present

## 2024-07-11 DIAGNOSIS — J449 Chronic obstructive pulmonary disease, unspecified: Secondary | ICD-10-CM | POA: Insufficient documentation

## 2024-07-11 DIAGNOSIS — F17210 Nicotine dependence, cigarettes, uncomplicated: Secondary | ICD-10-CM | POA: Insufficient documentation

## 2024-07-11 DIAGNOSIS — N3010 Interstitial cystitis (chronic) without hematuria: Secondary | ICD-10-CM | POA: Diagnosis not present

## 2024-07-11 DIAGNOSIS — Z87442 Personal history of urinary calculi: Secondary | ICD-10-CM | POA: Diagnosis not present

## 2024-07-11 DIAGNOSIS — Z860100 Personal history of colon polyps, unspecified: Secondary | ICD-10-CM | POA: Diagnosis not present

## 2024-07-11 DIAGNOSIS — J9811 Atelectasis: Secondary | ICD-10-CM | POA: Insufficient documentation

## 2024-07-11 DIAGNOSIS — H903 Sensorineural hearing loss, bilateral: Secondary | ICD-10-CM | POA: Diagnosis not present

## 2024-07-11 DIAGNOSIS — I700 Atherosclerosis of aorta: Secondary | ICD-10-CM | POA: Diagnosis not present

## 2024-07-11 DIAGNOSIS — Z803 Family history of malignant neoplasm of breast: Secondary | ICD-10-CM | POA: Diagnosis not present

## 2024-07-11 DIAGNOSIS — E7800 Pure hypercholesterolemia, unspecified: Secondary | ICD-10-CM | POA: Diagnosis not present

## 2024-07-11 DIAGNOSIS — Z780 Asymptomatic menopausal state: Secondary | ICD-10-CM | POA: Diagnosis not present

## 2024-07-11 DIAGNOSIS — R053 Chronic cough: Secondary | ICD-10-CM | POA: Insufficient documentation

## 2024-07-11 LAB — PULMONARY FUNCTION TEST
DL/VA % pred: 85 %
DL/VA: 3.57 ml/min/mmHg/L
DLCO cor % pred: 89 %
DLCO cor: 16.15 ml/min/mmHg
DLCO unc % pred: 89 %
DLCO unc: 16.15 ml/min/mmHg
FEF 25-75 Post: 1.14 L/s
FEF 25-75 Pre: 0.9 L/s
FEF2575-%Change-Post: 27 %
FEF2575-%Pred-Post: 65 %
FEF2575-%Pred-Pre: 51 %
FEV1-%Change-Post: 6 %
FEV1-%Pred-Post: 84 %
FEV1-%Pred-Pre: 79 %
FEV1-Post: 1.74 L
FEV1-Pre: 1.63 L
FEV1FVC-%Change-Post: 0 %
FEV1FVC-%Pred-Pre: 88 %
FEV6-%Change-Post: 6 %
FEV6-%Pred-Post: 98 %
FEV6-%Pred-Pre: 91 %
FEV6-Post: 2.55 L
FEV6-Pre: 2.38 L
FEV6FVC-%Change-Post: 0 %
FEV6FVC-%Pred-Post: 103 %
FEV6FVC-%Pred-Pre: 102 %
FVC-%Change-Post: 6 %
FVC-%Pred-Post: 95 %
FVC-%Pred-Pre: 89 %
FVC-Post: 2.59 L
FVC-Pre: 2.44 L
Post FEV1/FVC ratio: 67 %
Post FEV6/FVC ratio: 98 %
Pre FEV1/FVC ratio: 67 %
Pre FEV6/FVC Ratio: 98 %
RV % pred: 176 %
RV: 3.72 L
TLC % pred: 131 %
TLC: 6.24 L

## 2024-07-11 NOTE — Patient Instructions (Signed)
 Full PFT performed today.

## 2024-07-11 NOTE — Progress Notes (Addendum)
 Full PFT performed today.

## 2024-07-12 NOTE — Progress Notes (Signed)
 The proposed treatment discussed in conference is for discussion purpose only and is not a binding recommendation.  The patients have not been physically examined, or presented with their treatment options.  Therefore, final treatment plans cannot be decided.

## 2024-07-14 ENCOUNTER — Encounter: Payer: Self-pay | Admitting: Thoracic Surgery (Cardiothoracic Vascular Surgery)

## 2024-07-14 ENCOUNTER — Ambulatory Visit
Attending: Thoracic Surgery (Cardiothoracic Vascular Surgery) | Admitting: Thoracic Surgery (Cardiothoracic Vascular Surgery)

## 2024-07-14 VITALS — BP 119/73 | HR 80 | Resp 20 | Ht 61.0 in | Wt 140.0 lb

## 2024-07-14 DIAGNOSIS — R911 Solitary pulmonary nodule: Secondary | ICD-10-CM | POA: Insufficient documentation

## 2024-07-14 NOTE — Progress Notes (Signed)
 PCP is [name], MD Referring Provider is Ruthell Lauraine FALCON, NP  Chief Complaint  Patient presents with   Lung Lesion    Surgical consult/ Bronch 06/24/24/Chest CT 06/24/24/ PFT's 07/11/24/ PET 03/06/24    HPI: Audrey Hall is sent for consultation regarding an adenocarcinoma of the right upper lobe.  Audrey Hall is a 73 year old woman with a history of tobacco abuse, emphysema, lung nodules, anxiety, hyperlipidemia, aortic and coronary atherosclerosis, irritable bowel syndrome, tinnitus, hearing loss, vertigo, reflux, and recently diagnosed clinical stage Ia non-small cell carcinoma of the right upper lobe.  Has been involved in the low-dose CT lung cancer screening program.  In May 2025 she had a lung RADS 4B scan with growth of a right upper lobe nodule from 6.3 to 8.3 mm.  Also noted to have a nodule in the superior segment of the left lower lobe which has been stable for 2 years and a ground glass opacity in the left upper lobe which has been stable for 2 years.  She had a PET/CT which showed a 5 mm nodule with an SUV of 1.3.  This was felt to correspond to the 8 mm nodule on CT.  There is no mediastinal or hilar adenopathy.  There is also some activity along the left breast.  She had a mammogram and then a biopsy which was negative for malignancy.  She had a follow-up CT of the chest in August which showed the right upper lobe nodule increased to 9 x 9 cm.  Left lung nodules were unchanged.  She then underwent a robotic bronchoscopy by Dr. Shelah.  Needle aspirations were consistent with non-small cell carcinoma.  She was smoking 2 packs/day.  Has started Wellbutrin and has cut back a little bit to about 1.5 packs/day.  Has set a target quit date for November 14.  She has lost about 10 pounds since her diagnosis due to anxiety and poor appetite.  Had not had weight loss prior to that.  Occasional wheezing but no shortness of breath with routine activities.  She can walk up a flight of stairs  without stopping.  Zubrod Score: At the time of surgery this patient's most appropriate activity status/level should be described as: [x]     0    Normal activity, no symptoms []     1    Restricted in physical strenuous activity but ambulatory, able to do out light work []     2    Ambulatory and capable of self care, unable to do work activities, up and about >50 % of waking hours                              []     3    Only limited self care, in bed greater than 50% of waking hours []     4    Completely disabled, no self care, confined to bed or chair []     5    Moribund  Past Medical History:  Diagnosis Date   Abdominal bloating    Abnormal CT scan    Allergic rhinitis    Anxiety    Aortic atherosclerosis    BMI 28.0-28.9,adult    BPPV (benign paroxysmal positional vertigo), unspecified laterality 09/12/2017   Breast anomaly    COPD (chronic obstructive pulmonary disease) (HCC)    Coronary artery disease    Full incontinence of feces    GERD without esophagitis  Grief reaction    Headache    High cholesterol    High risk medication use    Hypercholesteremia    IBS (irritable bowel syndrome)    Interstitial cystitis    Major depressive disorder, single episode, moderate (HCC)    Menopausal hot flushes    Mixed stress and urge urinary incontinence    Nephrolithiasis    Osteoporosis    Personal history of colonic polyps    PONV (postoperative nausea and vomiting)    Postmenopausal disorder    Sensorineural hearing loss (SNHL) of both ears 09/12/2017   Sleep disturbance    Smoker    Tinnitus, bilateral 09/12/2017   Vertigo 09/12/2017   Vitamin D deficiency     Past Surgical History:  Procedure Laterality Date   BREAST BIOPSY Left 05/09/2024   US  LT BREAST BX W LOC DEV 1ST LESION IMG BX SPEC US  GUIDE 05/09/2024 [facility]   BREAST CYST ASPIRATION     BREAST EXCISIONAL BIOPSY Right    CHOLECYSTECTOMY     DILATION AND CURETTAGE OF UTERUS     EYE SURGERY      cataract   LAPAROSCOPIC APPENDECTOMY N/A 09/25/2020   Procedure: APPENDECTOMY LAPAROSCOPIC;  Surgeon: Ebbie Cough, MD;  Location: [facility];  Service: General;  Laterality: N/A;   TONSILLECTOMY     TUBAL LIGATION     VIDEO BRONCHOSCOPY WITH ENDOBRONCHIAL NAVIGATION Right 06/24/2024   Procedure: VIDEO BRONCHOSCOPY WITH ENDOBRONCHIAL NAVIGATION;  Surgeon: Shelah Lamar RAMAN, MD;  Location: [facility];  Service: Pulmonary;  Laterality: Right;  right upper lobe    Family History  Problem Relation Age of Onset   Breast cancer Mother        75s   Breast cancer Sister 10   Breast cancer Maternal Aunt     Social History Social History   Tobacco Use   Smoking status: Every Day    Types: Cigarettes   Smokeless tobacco: Never   Tobacco comments:    Started smoking at age 43. Smokes 2 packs a day KRD 07/04/2024  Vaping Use   Vaping status: Some Days  Substance Use Topics   Alcohol use: No   Drug use: No    Current Outpatient Medications  Medication Sig Dispense Refill   albuterol (VENTOLIN HFA) 108 (90 Base) MCG/ACT inhaler Inhale 1-2 puffs into the lungs every 4 (four) hours as needed for wheezing or shortness of breath.     ALPRAZolam (XANAX) 0.5 MG tablet Take 0.5-1 mg by mouth at bedtime as needed for anxiety.     aspirin EC 81 MG tablet Take 81 mg by mouth daily.     B Complex Vitamins (VITAMIN B COMPLEX) TABS Take 1 tablet by mouth daily.     Black Cohosh 160 MG CAPS 1 capsule.     buPROPion (WELLBUTRIN SR) 150 MG 12 hr tablet Take 150 mg by mouth 2 (two) times daily.     Cholecalciferol (VITAMIN D3) 5000 UNITS CAPS Take 1 capsule by mouth daily.     estradiol (ESTRACE) 0.1 MG/GM vaginal cream Place 1 Applicatorful vaginally 2 (two) times a week.     fluticasone (FLONASE) 50 MCG/ACT nasal spray Place 1 spray into both nostrils daily.     lansoprazole (PREVACID) 30 MG capsule Take 30 mg by mouth daily.     loratadine (CLARITIN) 10 MG tablet Take 10 mg by mouth daily.      melatonin 1 MG TABS tablet Take 1 mg by mouth at bedtime.  rosuvastatin (CRESTOR) 40 MG tablet Take 40 mg by mouth daily.     triamcinolone cream (KENALOG) 0.1 % Apply 1 Application topically.     nitroGLYCERIN  (NITROSTAT ) 0.4 MG SL tablet Place 1 tablet (0.4 mg total) under the tongue every 5 (five) minutes as needed. (Patient not taking: Reported on 07/14/2024) 25 tablet 6   No current facility-administered medications for this visit.    Allergies  Allergen Reactions   Morphine      Other Reaction(s): Other (See Comments)  Makes me mean.   Citalopram Hydrobromide     Other Reaction(s): N, HA, felt strange   Estradiol Other (See Comments)   Fezolinetant Other (See Comments)   Pantoprazole  Sodium     Other Reaction(s): Did not help    Review of Systems  Constitutional:  Positive for activity change, appetite change, fever and unexpected weight change.  HENT:  Positive for hearing loss.   Respiratory:  Positive for wheezing.   Cardiovascular:  Negative for chest pain, palpitations and leg swelling.  Gastrointestinal:  Negative for abdominal distention and abdominal pain.  Genitourinary:  Negative for difficulty urinating and dysuria.  Musculoskeletal:  Negative for arthralgias and gait problem.  Neurological:  Negative for seizures and weakness.  Hematological:  Negative for adenopathy. Does not bruise/bleed easily.  Psychiatric/Behavioral:  The patient is nervous/anxious.     BP 119/73   Pulse 80   Resp 20   Ht 5' 1 (1.549 m)   Wt 140 lb (63.5 kg)   SpO2 95% Comment: RA  BMI 26.45 kg/m  Physical Exam Vitals reviewed.  Constitutional:      General: She is not in acute distress.    Appearance: Normal appearance.  HENT:     Head: Normocephalic and atraumatic.  Eyes:     General: No scleral icterus.    Extraocular Movements: Extraocular movements intact.  Cardiovascular:     Rate and Rhythm: Normal rate and regular rhythm.     Heart sounds: Murmur (Faint  systolic murmur) heard.  Pulmonary:     Effort: Pulmonary effort is normal. No respiratory distress.     Breath sounds: Normal breath sounds. No wheezing or rales.  Abdominal:     General: There is no distension.     Palpations: Abdomen is soft.  Lymphadenopathy:     Cervical: No cervical adenopathy.  Skin:    General: Skin is warm and dry.  Neurological:     General: No focal deficit present.     Mental Status: She is alert and oriented to person, place, and time.     Cranial Nerves: No cranial nerve deficit.     Motor: No weakness.    Diagnostic Tests: CT CHEST WITHOUT CONTRAST   TECHNIQUE: Multidetector CT imaging of the chest was performed using thin slice collimation for electromagnetic bronchoscopy planning purposes, without intravenous contrast.   RADIATION DOSE REDUCTION: This exam was performed according to the departmental dose-optimization program which includes automated exposure control, adjustment of the mA and/or kV according to patient size and/or use of iterative reconstruction technique.   COMPARISON:  Chest CT 05/26/2024 and 01/31/2024.  PET-CT 03/06/2024   FINDINGS: Cardiovascular: Atherosclerosis of the aorta, great vessels and coronary arteries. The heart size is normal. There is no pericardial effusion.   Mediastinum/Nodes: There are no enlarged mediastinal, hilar, axillary or internal mammary lymph nodes. Hilar assessment is limited by the lack of intravenous contrast, although the hilar contours appear unchanged. The thyroid  gland, trachea and esophagus demonstrate no significant findings.  Lungs/Pleura: No pleural effusion or pneumothorax. Moderate centrilobular emphysema. The part solid right upper lobe nodule persists and does not appear significantly changed, measuring approximately 8 x 7 mm on image 26/4. No significant change in ground-glass nodule posteriorly in the left upper lobe, measuring 8 mm on image 35/4 unchanged solid 5 mm  nodule in the superior segment of the left lower lobe on image 45/4. No new or enlarging nodules are identified. There is mild dependent atelectasis in both lungs.   Upper abdomen: The visualized upper abdomen appears stable post cholecystectomy. No acute findings.   Musculoskeletal/Chest wall: There is no chest wall mass or suspicious osseous finding.   IMPRESSION: 1. Imaging for bronchoscopy planning and guidance. 2. No significant change in part solid right upper lobe nodule, ground-glass left upper lobe nodule and solid left lower lobe nodule compared with recent prior chest CT. 3. No new or enlarging nodules identified. 4. No adenopathy or pleural effusion. 5. Aortic Atherosclerosis (ICD10-I70.0) and Emphysema (ICD10-J43.9).     Electronically Signed   By: Elsie Perone M.D.   On: 06/24/2024 11:40 NUCLEAR MEDICINE PET SKULL BASE TO THIGH   TECHNIQUE: 7.3 mCi F-18 FDG was injected intravenously. Full-ring PET imaging was performed from the skull base to thigh after the radiotracer. CT data was obtained and used for attenuation correction and anatomic localization.   Fasting blood glucose: 115 mg/dl   COMPARISON:  Chest CT 01/31/2024   FINDINGS: Mediastinal blood pool activity: SUV max 2.2   Liver activity: SUV max NA   NECK: Muscular activity in the left deltoid without CT correlate, likely physiologic.   Incidental CT findings: Bilateral common carotid atherosclerosis.   CHEST: A 5 mm right upper lobe nodule on image 15 of series 7 of today's PET-CT has a maximum standard uptake value of 1.3, although is below sensitive PET-CT size thresholds and accordingly indeterminate. As best I am able to tell, this is probably the lesion referred to on the previous two lung cancer screening CT scans.   A 4 mm subpleural nodule in the superior segment left lower lobe has no perceptible associated accentuated metabolic activity but is below sensitive PET-CT size  thresholds.   Mild focally accentuated activity is observed along the medial glandular tissues of the left breast, maximum SUV 3.7. This is only mildly above the glandular tissue of the contralateral breast has a maximum SUV of 2.8. Accordingly I think it is reasonable that the patient follow her previously recommended diagnostic mammogram in July of 2025.   Incidental CT findings: Coronary, aortic arch, and branch vessel atherosclerotic vascular disease. Centrilobular emphysema. Airway thickening is present, suggesting bronchitis or reactive airways disease.   ABDOMEN/PELVIS: No significant abnormal hypermetabolic activity in this region.   Incidental CT findings: Atherosclerosis is present, including aortoiliac atherosclerotic disease. Cholecystectomy. Sigmoid colon diverticulosis.   SKELETON: No significant abnormal hypermetabolic activity in this region.   Incidental CT findings: Dextroconvex thoracolumbar scoliosis with rotary component. Degenerative disc disease and degenerative endplate findings at L3-4 and L4-5.   IMPRESSION: 1. A 5 mm right upper lobe nodule has a maximum standard uptake value of 1.3, although is below sensitive PET-CT size thresholds and accordingly indeterminate. 2. A 4 mm subpleural nodule in the superior segment left lower lobe has no perceptible associated accentuated metabolic activity but is below sensitive PET-CT size thresholds. 3. Mild focally accentuated activity is observed along the medial glandular tissues of the left breast, maximum SUV 3.7. This is only mildly above the glandular tissue  of the contralateral breast has a maximum SUV of 2.8. Accordingly I think it is reasonable that the patient follow her previously recommended diagnostic mammogram in July of 2025. 4. Airway thickening is present, suggesting bronchitis or reactive airways disease. 5. Aortic Atherosclerosis (ICD10-I70.0) and Emphysema (ICD10-J43.9).      Electronically Signed   By: Ryan Salvage M.D.   On: 03/06/2024 14:29 I personally reviewed the CT and PET/CT images.  There is a 9 mm nodule in the right upper lobe with SUV of 1.2.  No hilar or mediastinal adenopathy.  Ground glass opacity left upper lobe and nodule left lower lobe stable over serial scans with no activity on PET.  Emphysema.  Aortic atherosclerosis.  Coronary atherosclerosis.  Pulmonary function testing 07/11/2024 FVC 2.44 (89%) FEV1 1.63 (79%) FEV1 1.74 (84%) DLCO 16.15 (89%)  Impression: Audrey Hall is a 73 year old woman with a history of tobacco abuse, emphysema, lung nodules, anxiety, hyperlipidemia, aortic and coronary atherosclerosis, irritable bowel syndrome, tinnitus, hearing loss, vertigo, reflux, and recently diagnosed clinical stage Ia non-small cell carcinoma of the right upper lobe.  Non-small cell carcinoma right upper lobe-clinical T1, N0, stage Ia.  Most likely adenocarcinoma.  Treatment options include surgical resection and stereotactic radiation.  We discussed the relative advantages and disadvantages of each.  She has already met with Dr. Izell from radiation oncology.  I discussed the proposed operative procedure with Mrs. Peeler and her husband.  I informed them of the general nature of the procedure including the need for general anesthesia, the incisions to be used, the use of the surgical robot, the impact on her pulmonary function, the use of a drainage tube postoperatively, the expected hospital stay, and the overall recovery.  I informed her of the indications, risks, benefits, and alternatives.  She understands the risks include, but not limited to death, MI, DVT, PE, bleeding, possible need for transfusion, infection, prolonged air leak, cardiac arrhythmias, as well as possibility of other unforeseeable complications.  She understands the degree of postoperative pain is unpredictable and a small percentage of patients have chronic pain  issues.  She has some emphysema on CT, but does have adequate pulmonary function to tolerate resection.  Coronary and aortic atherosclerosis-she is on a statin.  Asymptomatic.  Tobacco abuse-has started Wellbutrin.  Has set a start date 6 weeks out in mid November.  Would like her to quit smoking prior to surgery.  Did inform her that is not a problem to use nicotine substitutes if she can stop smoking cigarettes earlier we can proceed with her surgery.   Plan: Quit smoking Tentatively plan for robotic assisted right upper lobectomy on 09/15/2024.  Patient will call to schedule sooner if she can quit smoking earlier.  Elspeth JAYSON Millers, MD [facility] [phone]

## 2024-07-15 ENCOUNTER — Encounter: Payer: Self-pay | Admitting: *Deleted

## 2024-07-15 ENCOUNTER — Other Ambulatory Visit: Payer: Self-pay | Admitting: *Deleted

## 2024-07-15 DIAGNOSIS — R911 Solitary pulmonary nodule: Secondary | ICD-10-CM

## 2024-07-15 DIAGNOSIS — Z5189 Encounter for other specified aftercare: Secondary | ICD-10-CM

## 2024-07-16 ENCOUNTER — Other Ambulatory Visit

## 2024-07-17 ENCOUNTER — Inpatient Hospital Stay: Admission: RE | Admit: 2024-07-17 | Discharge: 2024-07-17 | Attending: Acute Care | Admitting: Acute Care

## 2024-07-17 DIAGNOSIS — C3490 Malignant neoplasm of unspecified part of unspecified bronchus or lung: Secondary | ICD-10-CM

## 2024-07-17 MED ORDER — GADOPICLENOL 0.5 MMOL/ML IV SOLN
6.0000 mL | Freq: Once | INTRAVENOUS | Status: AC | PRN
  Administered 2024-07-17: 6 mL via INTRAVENOUS

## 2024-09-08 NOTE — Pre-Procedure Instructions (Signed)
 Surgical Instructions   Your procedure is scheduled on Monday, December 1st.  Report to [facility] Main Entrance A at 0530 A.M., then check in with the Admitting office. Any questions or running late day of surgery: call [phone]  Questions prior to your surgery date: call [phone], Monday-Friday, 8am-4pm. If you experience any cold or flu symptoms such as cough, fever, chills, shortness of breath, etc. between now and your scheduled surgery, please notify us  at the above number.     Remember:  Do not eat or drink after midnight the night before your surgery    Take these medicines the morning of surgery with A SIP OF WATER  buPROPion (WELLBUTRIN SR)  loratadine (CLARITIN REDITABS)  May take these medicines IF NEEDED:  albuterol (VENTOLIN HFA) inhaler - please bring it to the hospital Carboxymethylcellulose Sodium (ARTIFICIAL TEARS OP)  fluticasone (FLONASE) nasal spray  nitroGLYCERIN  (NITROSTAT )   Follow your surgeon's instructions on when to stop Aspirin. If no instructions were given - call dr. Chrystal office.  One week prior to surgery, STOP taking any Aleve, Naproxen, Ibuprofen, Motrin, Advil, Goody's, BC's, all herbal medications, fish oil, and non-prescription vitamins.                     Do NOT Smoke (Tobacco/Vaping) for 24 hours prior to your procedure.  If you use a CPAP at night, you may bring your mask/headgear for your overnight stay.   You will be asked to remove any contacts, glasses, piercing's, hearing aid's, dentures/partials prior to surgery. Please bring cases for these items if needed.    Patients discharged the day of surgery will not be allowed to drive home, and someone needs to stay with them for 24 hours.  SURGICAL WAITING ROOM VISITATION Patients may have no more than 2 support people in the waiting area - these visitors may rotate.   Pre-op nurse will coordinate an appropriate time for 1 ADULT support person, who may not rotate, to  accompany patient in pre-op.  Children under the age of 83 must have an adult with them who is not the patient and must remain in the main waiting area with an adult.  If the patient needs to stay at the hospital during part of their recovery, the visitor guidelines for inpatient rooms apply.  Please refer to the [facility] website for the visitor guidelines for any additional information.   If you received a COVID test during your pre-op visit  it is requested that you wear a mask when out in public, stay away from anyone that may not be feeling well and notify your surgeon if you develop symptoms. If you have been in contact with anyone that has tested positive in the last 10 days please notify you surgeon.      Pre-operative CHG Bathing Instructions   You can play a key role in reducing the risk of infection after surgery. Your skin needs to be as free of germs as possible. You can reduce the number of germs on your skin by washing with CHG (chlorhexidine  gluconate) soap before surgery. CHG is an antiseptic soap that kills germs and continues to kill germs even after washing.   DO NOT use if you have an allergy to chlorhexidine /CHG or antibacterial soaps. If your skin becomes reddened or irritated, stop using the CHG and notify one of our RNs at [phone].              TAKE A SHOWER THE NIGHT  BEFORE SURGERY   Please keep in mind the following:  DO NOT shave, including legs and underarms, 48 hours prior to surgery.   You may shave your face before/day of surgery.  Place clean sheets on your bed the night before surgery Use a clean washcloth (not used since being washed) for shower. DO NOT sleep with pet's night before surgery.  CHG Shower Instructions:  Wash your face and private area with normal soap. If you choose to wash your hair, wash first with your normal shampoo.  After you use shampoo/soap, rinse your hair and body thoroughly to remove shampoo/soap residue.  Turn the  water OFF and apply half the bottle of CHG soap to a CLEAN washcloth.  Apply CHG soap ONLY FROM YOUR NECK DOWN TO YOUR TOES (washing for 3-5 minutes)  DO NOT use CHG soap on face, private areas, open wounds, or sores.  Pay special attention to the area where your surgery is being performed.  If you are having back surgery, having someone wash your back for you may be helpful. Wait 2 minutes after CHG soap is applied, then you may rinse off the CHG soap.  Pat dry with a clean towel  Put on clean pajamas    Additional instructions for the day of surgery: If you choose, you may shower the morning of surgery with an antibacterial soap.  DO NOT APPLY any lotions, deodorants, cologne, or perfumes.   Do not wear jewelry or makeup Do not wear nail polish, gel polish, artificial nails, or any other type of covering on natural nails (fingers and toes) Do not bring valuables to the hospital. [facility] is not responsible for valuables/personal belongings. Put on clean/comfortable clothes.  Please brush your teeth.  Ask your nurse before applying any prescription medications to the skin.

## 2024-09-09 ENCOUNTER — Encounter (HOSPITAL_COMMUNITY): Payer: Self-pay

## 2024-09-09 ENCOUNTER — Other Ambulatory Visit: Payer: Self-pay | Admitting: *Deleted

## 2024-09-09 ENCOUNTER — Other Ambulatory Visit: Payer: Self-pay

## 2024-09-09 ENCOUNTER — Encounter (HOSPITAL_COMMUNITY)
Admission: RE | Admit: 2024-09-09 | Discharge: 2024-09-09 | Disposition: A | Discharge status: Home / Self Care | Source: Ambulatory Visit | Attending: Thoracic Surgery (Cardiothoracic Vascular Surgery) | Admitting: Thoracic Surgery (Cardiothoracic Vascular Surgery)

## 2024-09-09 VITALS — BP 132/70 | HR 77 | Temp 98.4°F | Resp 17 | Ht 61.5 in | Wt 142.2 lb

## 2024-09-09 DIAGNOSIS — C3411 Malignant neoplasm of upper lobe, right bronchus or lung: Secondary | ICD-10-CM | POA: Diagnosis not present

## 2024-09-09 DIAGNOSIS — R911 Solitary pulmonary nodule: Secondary | ICD-10-CM | POA: Diagnosis not present

## 2024-09-09 DIAGNOSIS — Q2112 Patent foramen ovale: Secondary | ICD-10-CM | POA: Insufficient documentation

## 2024-09-09 DIAGNOSIS — Z5189 Encounter for other specified aftercare: Secondary | ICD-10-CM | POA: Insufficient documentation

## 2024-09-09 DIAGNOSIS — Z01818 Encounter for other preprocedural examination: Secondary | ICD-10-CM | POA: Diagnosis present

## 2024-09-09 DIAGNOSIS — I2510 Atherosclerotic heart disease of native coronary artery without angina pectoris: Secondary | ICD-10-CM | POA: Diagnosis not present

## 2024-09-09 DIAGNOSIS — I700 Atherosclerosis of aorta: Secondary | ICD-10-CM | POA: Diagnosis not present

## 2024-09-09 DIAGNOSIS — M810 Age-related osteoporosis without current pathological fracture: Secondary | ICD-10-CM | POA: Diagnosis not present

## 2024-09-09 DIAGNOSIS — Z87891 Personal history of nicotine dependence: Secondary | ICD-10-CM | POA: Insufficient documentation

## 2024-09-09 LAB — PROTIME-INR
INR: 1 (ref 0.8–1.2)
Prothrombin Time: 13.7 s (ref 11.4–15.2)

## 2024-09-09 LAB — URINALYSIS, ROUTINE W REFLEX MICROSCOPIC
Bilirubin Urine: NEGATIVE
Glucose, UA: NEGATIVE mg/dL
Hgb urine dipstick: NEGATIVE
Ketones, ur: NEGATIVE mg/dL
Nitrite: NEGATIVE
Protein, ur: 100 mg/dL — AB
Specific Gravity, Urine: 1.01 (ref 1.005–1.030)
pH: 6 (ref 5.0–8.0)

## 2024-09-09 LAB — COMPREHENSIVE METABOLIC PANEL WITH GFR
ALT: 22 U/L (ref 0–44)
AST: 29 U/L (ref 15–41)
Albumin: 4.2 g/dL (ref 3.5–5.0)
Alkaline Phosphatase: 82 U/L (ref 38–126)
Anion gap: 9 (ref 5–15)
BUN: 8 mg/dL (ref 8–23)
CO2: 29 mmol/L (ref 22–32)
Calcium: 10.4 mg/dL — ABNORMAL HIGH (ref 8.9–10.3)
Chloride: 101 mmol/L (ref 98–111)
Creatinine, Ser: 1.04 mg/dL — ABNORMAL HIGH (ref 0.44–1.00)
GFR, Estimated: 57 mL/min — ABNORMAL LOW (ref >60)
Glucose, Bld: 114 mg/dL — ABNORMAL HIGH (ref 70–99)
Potassium: 2.7 mmol/L — CL (ref 3.5–5.1)
Sodium: 139 mmol/L (ref 135–145)
Total Bilirubin: 0.9 mg/dL (ref 0.0–1.2)
Total Protein: 7.2 g/dL (ref 6.5–8.1)

## 2024-09-09 LAB — SURGICAL PCR SCREEN
MRSA, PCR: NEGATIVE
Staphylococcus aureus: NEGATIVE

## 2024-09-09 LAB — CBC
HCT: 45.9 % (ref 36.0–46.0)
Hemoglobin: 15.3 g/dL — ABNORMAL HIGH (ref 12.0–15.0)
MCH: 30.2 pg (ref 26.0–34.0)
MCHC: 33.3 g/dL (ref 30.0–36.0)
MCV: 90.7 fL (ref 80.0–100.0)
Platelets: 376 K/uL (ref 150–400)
RBC: 5.06 MIL/uL (ref 3.87–5.11)
RDW: 15.1 % (ref 11.5–15.5)
WBC: 10.2 K/uL (ref 4.0–10.5)
nRBC: 0 % (ref 0.0–0.2)

## 2024-09-09 LAB — APTT: aPTT: 32 s (ref 24–36)

## 2024-09-09 MED ORDER — POTASSIUM CHLORIDE CRYS ER 20 MEQ PO TBCR: 40.0000 meq | EXTENDED_RELEASE_TABLET | Freq: Every day | ORAL | 0 refills | Status: DC

## 2024-09-09 NOTE — Progress Notes (Signed)
 U/A shows rare bacteria and small amount of leukocytes. K is 2.7. Bernardino Sprang, RN is aware and will inform dr. Kerrin. Isaiah PEDLAR, PA is aware.

## 2024-09-09 NOTE — Progress Notes (Signed)
 Per Dr. Kerrin, 40 mEq of potassium sent to preferred pharmacy. Patient to take daily for 7 days due to hypokalemia on pre-op labs.

## 2024-09-09 NOTE — Progress Notes (Signed)
 PCP - Okey Carlin Arch , MD Cardiologist - Devora Rosabel BIRCH, NP   Pulmonologist - Ruthell Lauraine FALCON, NP   PPM/ICD - denies Device Orders - n/a Rep Notified - n/a  Chest x-ray - DOS EKG - 09/09/2024 Stress Test - denies ECHO - 08/06/2023 Cardiac Cath - denies  Sleep Study - denies CPAP - n/a  Fasting Blood Sugar - no DM Checks Blood Sugar _____ times a day  Last dose of GLP1 agonist-  n/a GLP1 instructions: n/a  Blood Thinner Instructions: n/a Aspirin Instructions: follow surgeon's instructions  ERAS Protcol -no, NPO PRE-SURGERY Ensure or G2- n/a  COVID TEST- n/a   Anesthesia review: yes, hx of COPD, CAD.   Patient denies shortness of breath, fever, cough and chest pain at PAT appointment   All instructions explained to the patient, with a verbal understanding of the material. Patient agrees to go over the instructions while at home for a better understanding. Patient also instructed to self quarantine after being tested for COVID-19. The opportunity to ask questions was provided.

## 2024-09-10 NOTE — Anesthesia Preprocedure Evaluation (Addendum)
 Anesthesia Evaluation  Patient identified by MRN, date of birth, ID band Patient awake    Reviewed: Allergy & Precautions, NPO status , Patient's Chart, lab work & pertinent test results  History of Anesthesia Complications (+) PONV and history of anesthetic complications  Airway Mallampati: I  TM Distance: >3 FB Neck ROM: Full    Dental no notable dental hx. (+) Upper Dentures   Pulmonary COPD, Patient abstained from smoking., former smoker RUL nodule   Pulmonary exam normal        Cardiovascular + CAD   Rhythm:Regular Rate:Normal  Echo 08/06/2023: IMPRESSIONS   1. Left ventricular ejection fraction, by estimation, is 60 to 65%. The  left ventricle has normal function. The left ventricle has no regional  wall motion abnormalities. Undetermined rhythm. PFO cannot be ruled out.   2. Right ventricular systolic function is normal. The right ventricular  size is normal.   3. The mitral valve is normal in structure. Mild mitral valve  regurgitation. No evidence of mitral stenosis.   4. The aortic valve is normal in structure. Aortic valve regurgitation is  not visualized. No aortic stenosis is present.   5. The inferior vena cava is normal in size with greater than 50%  respiratory variability, suggesting right atrial pressure of 3 mmHg.     Neuro/Psych  Headaches  Anxiety Depression       GI/Hepatic Neg liver ROS,GERD  Medicated,,  Endo/Other  negative endocrine ROS    Renal/GU   negative genitourinary   Musculoskeletal negative musculoskeletal ROS (+)    Abdominal Normal abdominal exam  (+)   Peds  Hematology Lab Results      Component                Value               Date                      WBC                      10.2                09/09/2024                HGB                      15.3 (H)            09/09/2024                HCT                      45.9                09/09/2024                MCV                       90.7                09/09/2024                PLT                      376                 09/09/2024  Lab Results      Component                Value               Date                      NA                       142                 09/12/2024                K                        4.0                 09/12/2024                CO2                      27                  09/12/2024                GLUCOSE                  110 (H)             09/12/2024                BUN                      10                  09/12/2024                CREATININE               1.17 (H)            09/12/2024                CALCIUM                   10.4 (H)            09/12/2024                EGFR                     80                  07/17/2023                GFRNONAA                 50 (L)              09/12/2024              Anesthesia Other Findings   Reproductive/Obstetrics                              Anesthesia Physical Anesthesia Plan  ASA: 3  Anesthesia Plan: General   Post-op Pain Management:    Induction: Intravenous  PONV Risk Score and Plan: 4 or greater and Ondansetron , Dexamethasone  and Treatment may vary due to age  or medical condition  Airway Management Planned: Mask and Double Lumen EBT  Additional Equipment: Arterial line  Intra-op Plan:   Post-operative Plan: Extubation in OR  Informed Consent: I have reviewed the patients History and Physical, chart, labs and discussed the procedure including the risks, benefits and alternatives for the proposed anesthesia with the patient or authorized representative who has indicated his/her understanding and acceptance.     Dental advisory given  Plan Discussed with: CRNA  Anesthesia Plan Comments: (- 2PIV, vivisight  PAT note written 09/10/2024 by Isaiah Ruder, PA-C.  )         Anesthesia Quick Evaluation

## 2024-09-10 NOTE — Progress Notes (Addendum)
 Anesthesia Chart Review:  Case: [id number] Date/Time: 09/15/24 0715   Procedure: LOBECTOMY, LUNG, ROBOT-ASSISTED, USING VATS (Right: Chest) - ROBOTIC RIGHT UPPER LOBECTOMY   Anesthesia type: General   Diagnosis: Adenocarcinoma of upper lobe of right lung (HCC) [C34.11]   Pre-op diagnosis: RUL ADENOCARCINOMA   [facility] / [facility]   Surgeons: Kerrin Elspeth BROCKS, MD       DISCUSSION: Patient is a 73 year old female scheduled for the above procedure. She was found to have a RUL lung nodule on LCS chest CT. S/p video bronchoscopy with biopsies on 06/24/2024  with RUL nodule FNA + for non-small cell carcinoma and was referred to CT surgery and RAD-ONC to consider options and ultimately opted for above procedure.  History includes recent former smoker,  postoperative N/V, COPD, hypercholesterolemia, GERD, aortic atherosclerosis, CAD (elevated CAD 801, 96th percentile, mild CAD 07/11/2023 CCTA), PFO (small with left to right shunt 07/11/2023 CCTA, Dr. Edwyna recommended medical management 10/04/2023), RUL lung cancer (06/2024), IBS, vertigo/BPPV, interstitial cystitis.  She had a normal sleep study on 11/28/2023.   Last cardiology follow-up on 10/04/2023 with West, Katlyn, NP. CCTA 07/11/2023 showed CAC 801 (96th percentile) but only mild CAD in the proximal and mid LAD (25-49% stenosis) with risk factor modification advised. She did have a small PFO with left-to-right shunt with medical management planned. TTE on 08/06/2023 showed LVEF 60-65%, no RWMA,cannot be ruled out PFO, mild MR   Dr. Kerrin classified her Zubrod Score as 0: Normal activity, no symptoms. He noted she had a faint systolic murmur.    09/09/2024 labs showed K 2.7, Cr 1.04, glucose 114, H/H 15.3/45.9, PLT 376. Dr. Kerrin is starting her on KCl 40 mEq daily x 7 days with plans to recheck BMP on 09/12/2024. (UPDATE 09/12/2024 10:53 PM: Repeat BMP today showed potassium improved to 4.0.)   Anesthesia team to evaluate  on the day of surgery.    VS: BP 132/70   Pulse 77   Temp 36.9 C   Resp 17   Ht 5' 1.5 (1.562 m)   Wt 64.5 kg   SpO2 97%   BMI 26.43 kg/m   PROVIDERS: [name], MD is PCP  Edwyna Backers, MD was cardiologist, but following 10/14/2023 APP visit, transfer to care will be to Loni Rushing, MD Ruthell Lauraine Katheran Lamar, MD are pulmonology providers   LABS: See DISCUSSION. UPDATE: Repeat labs on 09/12/2024 showed improved potassium to 4.0. (all labs ordered are listed, but only abnormal results are displayed)  Labs Reviewed  CBC - Abnormal; Notable for the following components:      Result Value   Hemoglobin 15.3 (*)    All other components within normal limits  COMPREHENSIVE METABOLIC PANEL WITH GFR - Abnormal; Notable for the following components:   Potassium 2.7 (*)    Glucose, Bld 114 (*)    Creatinine, Ser 1.04 (*)    Calcium  10.4 (*)    GFR, Estimated 57 (*)    All other components within normal limits  URINALYSIS, ROUTINE W REFLEX MICROSCOPIC - Abnormal; Notable for the following components:   APPearance HAZY (*)    Protein, ur 100 (*)    Leukocytes,Ua SMALL (*)    Bacteria, UA RARE (*)    All other components within normal limits  SURGICAL PCR SCREEN  PROTIME-INR  APTT  TYPE AND SCREEN    Pulmonary function testing 07/11/2024: FVC 2.44 (89%) FEV1 1.63 (79%) FEV1 1.74 (84%) DLCO 16.15 (89%)    IMAGES:  She is for CXR on the day of surgery.   MRI Brain 07/17/2024: IMPRESSION: 1. No acute intracranial abnormality. 2. No findings suspicious for metastatic disease. 3. Moderate-to-severe periventricular and deep cerebral white matter disease.  CT Super D Chest 06/24/2024: IMPRESSION: 1. Imaging for bronchoscopy planning and guidance. 2. No significant change in part solid right upper lobe nodule, ground-glass left upper lobe nodule and solid left lower lobe nodule compared with recent prior chest CT. 3. No new or enlarging nodules  identified. 4. No adenopathy or pleural effusion. 5. Aortic Atherosclerosis (ICD10-I70.0) and Emphysema (ICD10-J43.9).   PET scan 03/06/2024: IMPRESSION: 1. A 5 mm right upper lobe nodule has a maximum standard uptake value of 1.3, although is below sensitive PET-CT size thresholds and accordingly indeterminate. 2. A 4 mm subpleural nodule in the superior segment left lower lobe has no perceptible associated accentuated metabolic activity but is below sensitive PET-CT size thresholds. 3. Mild focally accentuated activity is observed along the medial glandular tissues of the left breast, maximum SUV 3.7. This is only mildly above the glandular tissue of the contralateral breast has a maximum SUV of 2.8. Accordingly I think it is reasonable that the patient follow her previously recommended diagnostic mammogram in July of 2025. 4. Airway thickening is present, suggesting bronchitis or reactive airways disease. 5. Aortic Atherosclerosis (ICD10-I70.0) and Emphysema (ICD10-J43.9).     EKG:  EKG 09/08/2024: Normal sinus rhythm with sinus arrhythmia Lateral infarct, age undetermined Abnormal ECG When compared with ECG of 04-Jul-2023 15:39, No significant change was found Confirmed by Santo Kelly [phone]) on 09/09/2024 5:53:15 PM      CV: Echo 08/06/2023: IMPRESSIONS   1. Left ventricular ejection fraction, by estimation, is 60 to 65%. The  left ventricle has normal function. The left ventricle has no regional  wall motion abnormalities. Undetermined rhythm. PFO cannot be ruled out.   2. Right ventricular systolic function is normal. The right ventricular  size is normal.   3. The mitral valve is normal in structure. Mild mitral valve  regurgitation. No evidence of mitral stenosis.   4. The aortic valve is normal in structure. Aortic valve regurgitation is  not visualized. No aortic stenosis is present.   5. The inferior vena cava is normal in size with greater than 50%   respiratory variability, suggesting right atrial pressure of 3 mmHg.      CTA coronary 07/11/2023: IMPRESSION: 1. Mild CAD in the proximal and mid LAD, 25-49% stenosis, CADRADS 2. 2. Total plaque volume 674 mm3 which is 84th percentile for age- and sex-matched controls (calcified plaque 131 mm3; non-calcified plaque 543 mm3). TPV is severe. 3. Coronary calcium  score is 801, which places the patient in the 96th percentile for age and sex matched control. 4. Normal coronary origins with right dominance. 5. Small patent foramen ovale with left to right shunt.   RECOMMENDATIONS: CAD-RADS 2. Mild non-obstructive CAD (25-49%). Consider non-atherosclerotic causes of chest pain. Consider preventive therapy and risk factor modification.   Past Medical History:  Diagnosis Date   Abdominal bloating    Abnormal CT scan    Allergic rhinitis    Anxiety    Aortic atherosclerosis    BMI 28.0-28.9,adult    BPPV (benign paroxysmal positional vertigo), unspecified laterality 09/12/2017   Breast anomaly    COPD (chronic obstructive pulmonary disease) (HCC)    Coronary artery disease    Full incontinence of feces    GERD without esophagitis    Grief reaction    Headache  High cholesterol    High risk medication use    Hypercholesteremia    IBS (irritable bowel syndrome)    Interstitial cystitis    Major depressive disorder, single episode, moderate (HCC)    Menopausal hot flushes    Mixed stress and urge urinary incontinence    Nephrolithiasis    Osteoporosis    Personal history of colonic polyps    PONV (postoperative nausea and vomiting)    Postmenopausal disorder    Sensorineural hearing loss (SNHL) of both ears 09/12/2017   Sleep disturbance    Smoker    Tinnitus, bilateral 09/12/2017   Vertigo 09/12/2017   Vitamin D deficiency     Past Surgical History:  Procedure Laterality Date   BREAST BIOPSY Left 05/09/2024   US  LT BREAST BX W LOC DEV 1ST LESION IMG BX SPEC US  GUIDE  05/09/2024 [facility]MO[facility]   BREAST CYST ASPIRATION     BREAST EXCISIONAL BIOPSY Right    CHOLECYSTECTOMY     DILATION AND CURETTAGE OF UTERUS     EYE SURGERY     cataract   LAPAROSCOPIC APPENDECTOMY N/A 09/25/2020   Procedure: APPENDECTOMY LAPAROSCOPIC;  Surgeon: Ebbie Cough, MD;  Location: [facility];  Service: General;  Laterality: N/A;   TONSILLECTOMY     TUBAL LIGATION     VIDEO BRONCHOSCOPY WITH ENDOBRONCHIAL NAVIGATION Right 06/24/2024   Procedure: VIDEO BRONCHOSCOPY WITH ENDOBRONCHIAL NAVIGATION;  Surgeon: Shelah Lamar RAMAN, MD;  Location: [facility];  Service: Pulmonary;  Laterality: Right;  right upper lobe    MEDICATIONS:  albuterol (VENTOLIN HFA) 108 (90 Base) MCG/ACT inhaler   ALPRAZolam (XANAX) 0.5 MG tablet   aspirin EC 81 MG tablet   B Complex Vitamins (VITAMIN B COMPLEX) TABS   BLACK COHOSH PO   buPROPion (WELLBUTRIN SR) 150 MG 12 hr tablet   Carboxymethylcellulose Sodium (ARTIFICIAL TEARS OP)   Cholecalciferol (VITAMIN D3) 5000 UNITS CAPS   estradiol (ESTRACE) 0.1 MG/GM vaginal cream   fluticasone (FLONASE) 50 MCG/ACT nasal spray   hyoscyamine (LEVSIN SL) 0.125 MG SL tablet   lansoprazole (PREVACID) 30 MG capsule   loratadine (CLARITIN REDITABS) 10 MG dissolvable tablet   melatonin 3 MG TABS tablet   naproxen sodium (ALEVE) 220 MG tablet   nicotine (NICODERM CQ - DOSED IN MG/24 HOURS) 21 mg/24hr patch   nitroGLYCERIN  (NITROSTAT ) 0.4 MG SL tablet   potassium chloride  SA (KLOR-CON  M) 20 MEQ tablet   rosuvastatin (CRESTOR) 40 MG tablet   triamcinolone cream (KENALOG) 0.1 %   No current facility-administered medications for this encounter.    Isaiah Ruder, PA-C Surgical Short Stay/Anesthesiology [facility] Phone [phone] [facility] Phone [phone] 09/10/2024 10:19 AM

## 2024-09-12 ENCOUNTER — Encounter (HOSPITAL_COMMUNITY)
Admission: RE | Admit: 2024-09-12 | Discharge: 2024-09-12 | Disposition: A | Discharge status: Home / Self Care | Source: Ambulatory Visit | Attending: Thoracic Surgery (Cardiothoracic Vascular Surgery) | Admitting: Thoracic Surgery (Cardiothoracic Vascular Surgery)

## 2024-09-12 ENCOUNTER — Other Ambulatory Visit: Payer: Self-pay | Admitting: Thoracic Surgery (Cardiothoracic Vascular Surgery)

## 2024-09-12 DIAGNOSIS — Z01812 Encounter for preprocedural laboratory examination: Secondary | ICD-10-CM | POA: Insufficient documentation

## 2024-09-12 DIAGNOSIS — R911 Solitary pulmonary nodule: Secondary | ICD-10-CM | POA: Insufficient documentation

## 2024-09-12 LAB — BASIC METABOLIC PANEL WITH GFR
Anion gap: 10 (ref 5–15)
BUN: 10 mg/dL (ref 8–23)
CO2: 27 mmol/L (ref 22–32)
Calcium: 10.4 mg/dL — ABNORMAL HIGH (ref 8.9–10.3)
Chloride: 105 mmol/L (ref 98–111)
Creatinine, Ser: 1.17 mg/dL — ABNORMAL HIGH (ref 0.44–1.00)
GFR, Estimated: 50 mL/min — ABNORMAL LOW (ref >60)
Glucose, Bld: 110 mg/dL — ABNORMAL HIGH (ref 70–99)
Potassium: 4 mmol/L (ref 3.5–5.1)
Sodium: 142 mmol/L (ref 135–145)

## 2024-09-15 ENCOUNTER — Inpatient Hospital Stay (HOSPITAL_COMMUNITY)

## 2024-09-15 ENCOUNTER — Other Ambulatory Visit: Payer: Self-pay

## 2024-09-15 ENCOUNTER — Inpatient Hospital Stay (HOSPITAL_COMMUNITY): Payer: Self-pay | Admitting: Vascular Surgery

## 2024-09-15 ENCOUNTER — Encounter (HOSPITAL_COMMUNITY): Payer: Self-pay | Admitting: Thoracic Surgery (Cardiothoracic Vascular Surgery)

## 2024-09-15 ENCOUNTER — Encounter (HOSPITAL_COMMUNITY)
Admission: RE | Disposition: A | Payer: Self-pay | Source: Home / Self Care | Attending: Thoracic Surgery (Cardiothoracic Vascular Surgery)

## 2024-09-15 ENCOUNTER — Inpatient Hospital Stay (HOSPITAL_COMMUNITY): Payer: Self-pay

## 2024-09-15 ENCOUNTER — Inpatient Hospital Stay (HOSPITAL_COMMUNITY)
Admission: RE | Admit: 2024-09-15 | Discharge: 2024-09-18 | DRG: 164 | Disposition: A | Attending: Thoracic Surgery (Cardiothoracic Vascular Surgery) | Admitting: Thoracic Surgery (Cardiothoracic Vascular Surgery)

## 2024-09-15 DIAGNOSIS — R911 Solitary pulmonary nodule: Secondary | ICD-10-CM

## 2024-09-15 DIAGNOSIS — Z9889 Other specified postprocedural states: Principal | ICD-10-CM

## 2024-09-15 DIAGNOSIS — Z902 Acquired absence of lung [part of]: Secondary | ICD-10-CM

## 2024-09-15 DIAGNOSIS — J449 Chronic obstructive pulmonary disease, unspecified: Secondary | ICD-10-CM

## 2024-09-15 DIAGNOSIS — Z87891 Personal history of nicotine dependence: Secondary | ICD-10-CM | POA: Diagnosis not present

## 2024-09-15 DIAGNOSIS — C3411 Malignant neoplasm of upper lobe, right bronchus or lung: Secondary | ICD-10-CM | POA: Diagnosis present

## 2024-09-15 DIAGNOSIS — I2510 Atherosclerotic heart disease of native coronary artery without angina pectoris: Secondary | ICD-10-CM

## 2024-09-15 HISTORY — PX: INTERCOSTAL NERVE BLOCK: SHX5021

## 2024-09-15 HISTORY — PX: LOBECTOMY, LUNG, ROBOT-ASSISTED, USING VATS: SHX7607

## 2024-09-15 HISTORY — PX: LYMPH NODE BIOPSY: SHX201

## 2024-09-15 LAB — ABO/RH: ABO/RH(D): O NEG

## 2024-09-15 LAB — PREPARE RBC (CROSSMATCH)

## 2024-09-15 SURGERY — LOBECTOMY, LUNG, ROBOT-ASSISTED, USING VATS
Anesthesia: General | Site: Chest | Laterality: Right

## 2024-09-15 MED ORDER — 0.9 % SODIUM CHLORIDE (POUR BTL) OPTIME
TOPICAL | Status: DC | PRN
  Administered 2024-09-15: 2000 mL

## 2024-09-15 MED ORDER — ROCURONIUM BROMIDE 10 MG/ML (PF) SYRINGE
PREFILLED_SYRINGE | INTRAVENOUS | Status: AC
  Filled 2024-09-15: qty 10

## 2024-09-15 MED ORDER — ONDANSETRON HCL 4 MG/2ML IJ SOLN
INTRAMUSCULAR | Status: AC
  Filled 2024-09-15: qty 2

## 2024-09-15 MED ORDER — ACETAMINOPHEN 10 MG/ML IV SOLN
INTRAVENOUS | Status: AC
  Filled 2024-09-15: qty 100

## 2024-09-15 MED ORDER — SENNOSIDES-DOCUSATE SODIUM 8.6-50 MG PO TABS
1.0000 | ORAL_TABLET | Freq: Every day | ORAL | Status: DC
  Administered 2024-09-16 – 2024-09-17 (×2): 1 via ORAL
  Filled 2024-09-15 (×2): qty 1

## 2024-09-15 MED ORDER — PROPOFOL 10 MG/ML IV BOLUS
INTRAVENOUS | Status: DC | PRN
  Administered 2024-09-15: 25 ug/kg/min via INTRAVENOUS
  Administered 2024-09-15: 150 mg via INTRAVENOUS
  Administered 2024-09-15: 50 mg via INTRAVENOUS

## 2024-09-15 MED ORDER — ALBUTEROL SULFATE (2.5 MG/3ML) 0.083% IN NEBU: 2.5000 mg | INHALATION_SOLUTION | RESPIRATORY_TRACT | Status: DC | PRN

## 2024-09-15 MED ORDER — PROPOFOL 10 MG/ML IV BOLUS
INTRAVENOUS | Status: AC
  Filled 2024-09-15: qty 20

## 2024-09-15 MED ORDER — ORAL CARE MOUTH RINSE: 15.0000 mL | Freq: Once | OROMUCOSAL | Status: AC

## 2024-09-15 MED ORDER — PANTOPRAZOLE SODIUM 40 MG PO TBEC
40.0000 mg | DELAYED_RELEASE_TABLET | Freq: Every day | ORAL | Status: DC
  Administered 2024-09-16 – 2024-09-18 (×3): 40 mg via ORAL
  Filled 2024-09-15 (×3): qty 1

## 2024-09-15 MED ORDER — HYOSCYAMINE SULFATE 0.125 MG SL SUBL: 0.1250 mg | SUBLINGUAL_TABLET | SUBLINGUAL | Status: DC | PRN

## 2024-09-15 MED ORDER — GABAPENTIN 300 MG PO CAPS: 300.0000 mg | ORAL_CAPSULE | Freq: Two times a day (BID) | ORAL | Status: DC

## 2024-09-15 MED ORDER — BUPIVACAINE HCL (PF) 0.5 % IJ SOLN
INTRAMUSCULAR | Status: AC
  Filled 2024-09-15: qty 30

## 2024-09-15 MED ORDER — DROPERIDOL 2.5 MG/ML IJ SOLN
0.6250 mg | Freq: Once | INTRAMUSCULAR | Status: AC | PRN
  Administered 2024-09-15: 0.625 mg via INTRAVENOUS

## 2024-09-15 MED ORDER — ENOXAPARIN SODIUM 40 MG/0.4ML IJ SOSY
40.0000 mg | PREFILLED_SYRINGE | Freq: Every day | INTRAMUSCULAR | Status: DC
  Administered 2024-09-15 – 2024-09-17 (×3): 40 mg via SUBCUTANEOUS
  Filled 2024-09-15 (×3): qty 0.4

## 2024-09-15 MED ORDER — NICOTINE 21 MG/24HR TD PT24
21.0000 mg | MEDICATED_PATCH | Freq: Every day | TRANSDERMAL | Status: DC
  Filled 2024-09-15: qty 1

## 2024-09-15 MED ORDER — ONDANSETRON HCL 4 MG/2ML IJ SOLN
4.0000 mg | Freq: Four times a day (QID) | INTRAMUSCULAR | Status: DC | PRN
  Administered 2024-09-15: 4 mg via INTRAVENOUS
  Filled 2024-09-15 (×2): qty 2

## 2024-09-15 MED ORDER — SUGAMMADEX SODIUM 200 MG/2ML IV SOLN
INTRAVENOUS | Status: DC | PRN
  Administered 2024-09-15: 200 mg via INTRAVENOUS

## 2024-09-15 MED ORDER — SODIUM CHLORIDE 0.9% IV SOLUTION: Freq: Once | INTRAVENOUS | Status: DC

## 2024-09-15 MED ORDER — PHENYLEPHRINE 80 MCG/ML (10ML) SYRINGE FOR IV PUSH (FOR BLOOD PRESSURE SUPPORT)
PREFILLED_SYRINGE | INTRAVENOUS | Status: DC | PRN
  Administered 2024-09-15 (×2): 80 ug via INTRAVENOUS

## 2024-09-15 MED ORDER — ALPRAZOLAM 0.5 MG PO TABS
0.5000 mg | ORAL_TABLET | Freq: Every evening | ORAL | Status: DC | PRN
  Administered 2024-09-17: 0.5 mg via ORAL
  Filled 2024-09-15: qty 1

## 2024-09-15 MED ORDER — PHENYLEPHRINE HCL-NACL 20-0.9 MG/250ML-% IV SOLN
INTRAVENOUS | Status: DC | PRN
  Administered 2024-09-15: 30 ug/min via INTRAVENOUS

## 2024-09-15 MED ORDER — SODIUM CHLORIDE FLUSH 0.9 % IV SOLN
INTRAVENOUS | Status: DC | PRN
  Administered 2024-09-15: 100 mL

## 2024-09-15 MED ORDER — POLYVINYL ALCOHOL 1.4 % OP SOLN: 2.0000 [drp] | Freq: Every day | OPHTHALMIC | Status: DC | PRN

## 2024-09-15 MED ORDER — FLUTICASONE PROPIONATE 50 MCG/ACT NA SUSP: 2.0000 | Freq: Every day | NASAL | Status: DC | PRN

## 2024-09-15 MED ORDER — GABAPENTIN 300 MG PO CAPS
300.0000 mg | ORAL_CAPSULE | Freq: Every day | ORAL | Status: DC
  Administered 2024-09-16 – 2024-09-17 (×2): 300 mg via ORAL
  Filled 2024-09-15 (×2): qty 1

## 2024-09-15 MED ORDER — PROPOFOL 1000 MG/100ML IV EMUL
INTRAVENOUS | Status: AC
  Filled 2024-09-15: qty 100

## 2024-09-15 MED ORDER — ACETAMINOPHEN 500 MG PO TABS
1000.0000 mg | ORAL_TABLET | Freq: Four times a day (QID) | ORAL | Status: DC
  Administered 2024-09-16 – 2024-09-18 (×7): 1000 mg via ORAL
  Filled 2024-09-15 (×9): qty 2

## 2024-09-15 MED ORDER — SODIUM CHLORIDE (PF) 0.9 % IJ SOLN
INTRAMUSCULAR | Status: AC
  Filled 2024-09-15: qty 50

## 2024-09-15 MED ORDER — ONDANSETRON HCL 4 MG/2ML IJ SOLN
INTRAMUSCULAR | Status: DC | PRN
  Administered 2024-09-15: 4 mg via INTRAVENOUS

## 2024-09-15 MED ORDER — BISACODYL 5 MG PO TBEC
10.0000 mg | DELAYED_RELEASE_TABLET | Freq: Every day | ORAL | Status: DC
  Administered 2024-09-16 – 2024-09-18 (×3): 10 mg via ORAL
  Filled 2024-09-15 (×3): qty 2

## 2024-09-15 MED ORDER — METOCLOPRAMIDE HCL 5 MG/ML IJ SOLN
10.0000 mg | Freq: Four times a day (QID) | INTRAMUSCULAR | Status: AC
  Administered 2024-09-15 – 2024-09-16 (×2): 10 mg via INTRAVENOUS
  Filled 2024-09-15 (×3): qty 2

## 2024-09-15 MED ORDER — LIDOCAINE 2% (20 MG/ML) 5 ML SYRINGE
INTRAMUSCULAR | Status: AC
  Filled 2024-09-15: qty 5

## 2024-09-15 MED ORDER — LACTATED RINGERS IV SOLN: INTRAVENOUS | Status: DC | PRN

## 2024-09-15 MED ORDER — FENTANYL CITRATE (PF) 50 MCG/ML IJ SOSY
25.0000 ug | PREFILLED_SYRINGE | INTRAMUSCULAR | Status: DC | PRN
  Administered 2024-09-15 (×2): 25 ug via INTRAVENOUS
  Filled 2024-09-15 (×3): qty 1

## 2024-09-15 MED ORDER — LIDOCAINE 2% (20 MG/ML) 5 ML SYRINGE
INTRAMUSCULAR | Status: DC | PRN
  Administered 2024-09-15: 40 mg via INTRAVENOUS

## 2024-09-15 MED ORDER — MIDAZOLAM HCL 2 MG/2ML IJ SOLN
INTRAMUSCULAR | Status: AC
  Filled 2024-09-15: qty 2

## 2024-09-15 MED ORDER — SODIUM CHLORIDE 0.9% IV SOLUTION
INTRAVENOUS | Status: AC | PRN
  Administered 2024-09-15: 1000 mL

## 2024-09-15 MED ORDER — BUPROPION HCL ER (SR) 150 MG PO TB12
150.0000 mg | ORAL_TABLET | Freq: Two times a day (BID) | ORAL | Status: DC
  Administered 2024-09-16: 150 mg via ORAL
  Filled 2024-09-15 (×7): qty 1

## 2024-09-15 MED ORDER — FENTANYL CITRATE (PF) 250 MCG/5ML IJ SOLN
INTRAMUSCULAR | Status: AC
  Filled 2024-09-15: qty 5

## 2024-09-15 MED ORDER — CEFAZOLIN SODIUM-DEXTROSE 2-4 GM/100ML-% IV SOLN
2.0000 g | INTRAVENOUS | Status: AC
  Administered 2024-09-15: 2 g via INTRAVENOUS
  Filled 2024-09-15: qty 100

## 2024-09-15 MED ORDER — ROCURONIUM BROMIDE 10 MG/ML (PF) SYRINGE
PREFILLED_SYRINGE | INTRAVENOUS | Status: DC | PRN
  Administered 2024-09-15: 60 mg via INTRAVENOUS
  Administered 2024-09-15: 20 mg via INTRAVENOUS

## 2024-09-15 MED ORDER — LORATADINE 10 MG PO TABS
10.0000 mg | ORAL_TABLET | Freq: Every day | ORAL | Status: DC
  Administered 2024-09-16: 10 mg via ORAL
  Filled 2024-09-15 (×2): qty 1

## 2024-09-15 MED ORDER — CHLORHEXIDINE GLUCONATE 0.12 % MT SOLN
15.0000 mL | Freq: Once | OROMUCOSAL | Status: AC
  Administered 2024-09-15: 15 mL via OROMUCOSAL
  Filled 2024-09-15: qty 15

## 2024-09-15 MED ORDER — OXYCODONE HCL 5 MG PO TABS
5.0000 mg | ORAL_TABLET | ORAL | Status: DC | PRN
  Administered 2024-09-16 (×2): 10 mg via ORAL
  Filled 2024-09-15 (×2): qty 2
  Filled 2024-09-15: qty 1

## 2024-09-15 MED ORDER — MIDAZOLAM HCL (PF) 2 MG/2ML IJ SOLN
INTRAMUSCULAR | Status: DC | PRN
  Administered 2024-09-15 (×2): 1 mg via INTRAVENOUS

## 2024-09-15 MED ORDER — ACETAMINOPHEN 10 MG/ML IV SOLN
1000.0000 mg | Freq: Once | INTRAVENOUS | Status: DC | PRN
  Administered 2024-09-15: 1000 mg via INTRAVENOUS

## 2024-09-15 MED ORDER — DROPERIDOL 2.5 MG/ML IJ SOLN
INTRAMUSCULAR | Status: AC
  Filled 2024-09-15: qty 2

## 2024-09-15 MED ORDER — PHENYLEPHRINE 80 MCG/ML (10ML) SYRINGE FOR IV PUSH (FOR BLOOD PRESSURE SUPPORT)
PREFILLED_SYRINGE | INTRAVENOUS | Status: AC
  Filled 2024-09-15: qty 20

## 2024-09-15 MED ORDER — FENTANYL CITRATE (PF) 250 MCG/5ML IJ SOLN
INTRAMUSCULAR | Status: DC | PRN
  Administered 2024-09-15: 100 ug via INTRAVENOUS
  Administered 2024-09-15 (×2): 50 ug via INTRAVENOUS

## 2024-09-15 MED ORDER — DEXAMETHASONE SOD PHOSPHATE PF 10 MG/ML IJ SOLN
INTRAMUSCULAR | Status: DC | PRN
  Administered 2024-09-15: 10 mg via INTRAVENOUS

## 2024-09-15 MED ORDER — SODIUM CHLORIDE 0.9 % IV SOLN
12.5000 mg | Freq: Four times a day (QID) | INTRAVENOUS | Status: DC | PRN
  Administered 2024-09-16: 12.5 mg via INTRAVENOUS
  Filled 2024-09-15: qty 12.5

## 2024-09-15 MED ORDER — ROSUVASTATIN CALCIUM 20 MG PO TABS
40.0000 mg | ORAL_TABLET | Freq: Every day | ORAL | Status: DC
  Administered 2024-09-16 – 2024-09-17 (×2): 40 mg via ORAL
  Filled 2024-09-15 (×2): qty 2

## 2024-09-15 MED ORDER — ACETAMINOPHEN 160 MG/5ML PO SOLN: 1000.0000 mg | Freq: Four times a day (QID) | ORAL | Status: DC

## 2024-09-15 MED ORDER — METHOCARBAMOL 500 MG PO TABS
500.0000 mg | ORAL_TABLET | Freq: Three times a day (TID) | ORAL | Status: DC
  Administered 2024-09-16 – 2024-09-18 (×7): 500 mg via ORAL
  Filled 2024-09-15 (×7): qty 1

## 2024-09-15 MED ORDER — CEFAZOLIN SODIUM-DEXTROSE 2-4 GM/100ML-% IV SOLN
INTRAVENOUS | Status: AC
  Filled 2024-09-15: qty 100

## 2024-09-15 MED ORDER — ASPIRIN 81 MG PO TBEC
81.0000 mg | DELAYED_RELEASE_TABLET | Freq: Every day | ORAL | Status: DC
  Administered 2024-09-16 – 2024-09-18 (×3): 81 mg via ORAL
  Filled 2024-09-15 (×3): qty 1

## 2024-09-15 MED ORDER — FENTANYL CITRATE (PF) 100 MCG/2ML IJ SOLN: 25.0000 ug | INTRAMUSCULAR | Status: DC | PRN

## 2024-09-15 MED ORDER — CEFAZOLIN SODIUM-DEXTROSE 2-4 GM/100ML-% IV SOLN
2.0000 g | Freq: Three times a day (TID) | INTRAVENOUS | Status: AC
  Administered 2024-09-15 (×2): 2 g via INTRAVENOUS
  Filled 2024-09-15: qty 100

## 2024-09-15 SURGICAL SUPPLY — 62 items
CANISTER SUCTION 3000ML PPV (SUCTIONS) ×4 IMPLANT
CANNULA REDUCER 12-8 DVNC XI (CANNULA) ×4 IMPLANT
CNTNR URN SCR LID CUP LEK RST (MISCELLANEOUS) ×10 IMPLANT
CONN ST 1/4X3/8 BEN (MISCELLANEOUS) IMPLANT
DEFOGGER SCOPE WARM SEASHARP (MISCELLANEOUS) ×2 IMPLANT
DERMABOND ADVANCED .7 DNX12 (GAUZE/BANDAGES/DRESSINGS) ×2 IMPLANT
DRAIN CHANNEL 28F RND 3/8 FF (WOUND CARE) IMPLANT
DRAPE ARM DVNC X/XI (DISPOSABLE) ×8 IMPLANT
DRAPE COLUMN DVNC XI (DISPOSABLE) ×2 IMPLANT
DRAPE CV SPLIT W-CLR ANES SCRN (DRAPES) ×2 IMPLANT
DRAPE HALF SHEET 40X57 (DRAPES) IMPLANT
DRAPE INCISE IOBAN 66X45 STRL (DRAPES) IMPLANT
DRAPE SURG ORHT 6 SPLT 77X108 (DRAPES) ×2 IMPLANT
ELECT BLADE 6.5 EXT (BLADE) ×2 IMPLANT
ELECTRODE REM PT RTRN 9FT ADLT (ELECTROSURGICAL) ×2 IMPLANT
FORCEPS BPLR FENES DVNC XI (FORCEP) IMPLANT
FORCEPS BPLR LNG DVNC XI (INSTRUMENTS) IMPLANT
GAUZE KITTNER 4X5 RF (MISCELLANEOUS) ×4 IMPLANT
GAUZE SPONGE 4X4 12PLY STRL (GAUZE/BANDAGES/DRESSINGS) ×2 IMPLANT
GAUZE SPONGE 4X4 12PLY STRL LF (GAUZE/BANDAGES/DRESSINGS) IMPLANT
GLOVE SS BIOGEL STRL SZ 7.5 (GLOVE) ×6 IMPLANT
GLOVE SURG POLYISO LF SZ8 (GLOVE) ×2 IMPLANT
GOWN STRL REUS W/ TWL LRG LVL3 (GOWN DISPOSABLE) ×4 IMPLANT
GOWN STRL REUS W/ TWL XL LVL3 (GOWN DISPOSABLE) ×4 IMPLANT
GOWN STRL REUS W/TWL 2XL LVL3 (GOWN DISPOSABLE) ×2 IMPLANT
GRASPER TIP-UP FEN DVNC XI (INSTRUMENTS) IMPLANT
HEMOSTAT SURGICEL 2X14 (HEMOSTASIS) ×6 IMPLANT
IRRIGATION STRYKERFLOW (MISCELLANEOUS) ×2 IMPLANT
KIT BASIN OR (CUSTOM PROCEDURE TRAY) ×2 IMPLANT
KIT TURNOVER KIT B (KITS) ×2 IMPLANT
NDL HYPO 25GX1X1/2 BEV (NEEDLE) ×2 IMPLANT
NDL SPNL 22GX3.5 QUINCKE BK (NEEDLE) ×2 IMPLANT
PACK CHEST (CUSTOM PROCEDURE TRAY) ×2 IMPLANT
PAD ARMBOARD POSITIONER FOAM (MISCELLANEOUS) ×4 IMPLANT
PORT ACCESS TROCAR AIRSEAL 12 (TROCAR) ×2 IMPLANT
RELOAD STAPLE 45 2.0 GRY DVNC (STAPLE) IMPLANT
RELOAD STAPLE 45 2.5 WHT DVNC (STAPLE) IMPLANT
RELOAD STAPLE 45 3.5 BLU DVNC (STAPLE) IMPLANT
RELOAD STAPLE 45 4.3 GRN DVNC (STAPLE) IMPLANT
SEAL UNIV 5-12 XI (MISCELLANEOUS) ×8 IMPLANT
SEALER SYNCHRO 8 IS4000 DVNC (MISCELLANEOUS) IMPLANT
SET TRI-LUMEN FLTR TB AIRSEAL (TUBING) ×2 IMPLANT
SOLN 0.9% NACL POUR BTL 1000ML (IV SOLUTION) ×4 IMPLANT
SOLN STERILE WATER BTL 1000 ML (IV SOLUTION) ×4 IMPLANT
SOLUTION ELECTROSURG ANTI STCK (MISCELLANEOUS) ×2 IMPLANT
SPONGE TONSIL 1 RF SGL (DISPOSABLE) IMPLANT
STAPLER 45 SUREFORM CVD DVNC (STAPLE) IMPLANT
SUT PROLENE 4-0 RB1 .5 CRCL 36 (SUTURE) IMPLANT
SUT SILK 1 MH (SUTURE) ×2 IMPLANT
SUT SILK 2 0 SH (SUTURE) ×2 IMPLANT
SUT VIC AB 1 CTX36XBRD ANBCTR (SUTURE) ×2 IMPLANT
SUT VIC AB 2-0 CTX 36 (SUTURE) ×2 IMPLANT
SUT VIC AB 3-0 X1 27 (SUTURE) ×4 IMPLANT
SUT VICRYL 0 TIES 12 18 (SUTURE) ×2 IMPLANT
SUT VICRYL 0 UR6 27IN ABS (SUTURE) ×4 IMPLANT
SYR 20CC LL (SYRINGE) ×4 IMPLANT
SYSTEM RETRIEVAL ANCHOR 15 (MISCELLANEOUS) IMPLANT
SYSTEM SAHARA CHEST DRAIN ATS (WOUND CARE) ×2 IMPLANT
TAPE CLOTH 4X10 WHT NS (GAUZE/BANDAGES/DRESSINGS) ×2 IMPLANT
TOWEL GREEN STERILE (TOWEL DISPOSABLE) ×2 IMPLANT
TRAY FOLEY MTR SLVR 14FR STAT (SET/KITS/TRAYS/PACK) IMPLANT
TRAY FOLEY MTR SLVR 16FR STAT (SET/KITS/TRAYS/PACK) ×2 IMPLANT

## 2024-09-15 NOTE — Discharge Summary (Incomplete)
 [address] [place] [address]             [phone]        Physician Discharge Summary  Patient ID: Audrey Hall MRN: [id number] DOB/AGE: 1951/07/14 73 y.o.  Admit date: 09/15/2024 Discharge date: 09/18/2024  Admission Diagnoses:  Discharge Diagnoses:  Principal Problem:   Status post robot-assisted surgical procedure Active Problems:   S/P lobectomy of lung   Discharged Condition: stable  HPI:  Audrey Hall is sent for consultation regarding an adenocarcinoma of the right upper lobe.   Audrey Hall is a 73 year old woman with a history of tobacco abuse, emphysema, lung nodules, anxiety, hyperlipidemia, aortic and coronary atherosclerosis, irritable bowel syndrome, tinnitus, hearing loss, vertigo, reflux, and recently diagnosed clinical stage Ia non-small cell carcinoma of the right upper lobe.   Has been involved in the low-dose CT lung cancer screening program.  In May 2025 she had a lung RADS 4B scan with growth of a right upper lobe nodule from 6.3 to 8.3 mm.  Also noted to have a nodule in the superior segment of the left lower lobe which has been stable for 2 years and a ground glass opacity in the left upper lobe which has been stable for 2 years.  She had a PET/CT which showed a 5 mm nodule with an SUV of 1.3.  This was felt to correspond to the 8 mm nodule on CT.  There is no mediastinal or hilar adenopathy.  There is also some activity along the left breast.  She had a mammogram and then a biopsy which was negative for malignancy.   She had a follow-up CT of the chest in August which showed the right upper lobe nodule increased to 9 x 9 cm.  Left lung nodules were unchanged.  She then underwent a robotic bronchoscopy by Dr. Shelah.  Needle aspirations were consistent with non-small cell carcinoma.   She was smoking 2 packs/day.  Has started Wellbutrin  and has cut back a little bit to about 1.5 packs/day.  Has set a target quit date  for November 14.  She has lost about 10 pounds since her diagnosis due to anxiety and poor appetite.  Had not had weight loss prior to that.  Occasional wheezing but no shortness of breath with routine activities.  She can walk up a flight of stairs without stopping.  Dr. Kerrin reviewed the patient's diagnostic studies and determined she would benefit from surgical intervention. He reviewed the patient's treatment options as well as the risks and benefits of surgery with the patient. Ms. Yaney was agreeable to proceed with surgery.  Hospital Course: Audrey Hall presented to [facility] and was brought to the operating room and underwent robotic assisted right upper lobectomy. She tolerated the procedure well and was transferred to the PACU in stable condition. She developed nausea and vomiting which resolved with reglan  and phenergan . CXR on POD1 showed a small right apical space and she had an air leak. Air leak resolved on 12/03 and chest tube was removed without complication. CXR showed slightly increased right apical pneumothorax that remained stable the following day. She had right basilar atelectasis and was started on aggressive pulmonary hygiene. She had some bleeding from her chest tube site when chest tube was removed but this resolved with direct pressure. She began ambulating well on room air, 6 minute walk test showed no need for home oxygen.  She was tolerating a diet and her incisions were healing well without sign of infection. She was felt stable for discharge home.   Consults: None  Significant Diagnostic Studies:  CLINICAL DATA:  Enlarging pulmonary nodule. Bronchoscopy planning. History of benign breast biopsy.   EXAM: CT CHEST WITHOUT CONTRAST   TECHNIQUE: Multidetector CT imaging of the chest was performed using thin slice collimation for electromagnetic bronchoscopy planning purposes, without intravenous contrast.   RADIATION DOSE REDUCTION: This exam was  performed according to the departmental dose-optimization program which includes automated exposure control, adjustment of the mA and/or kV according to patient size and/or use of iterative reconstruction technique.   COMPARISON:  Chest CT 05/26/2024 and 01/31/2024.  PET-CT 03/06/2024   FINDINGS: Cardiovascular: Atherosclerosis of the aorta, great vessels and coronary arteries. The heart size is normal. There is no pericardial effusion.   Mediastinum/Nodes: There are no enlarged mediastinal, hilar, axillary or internal mammary lymph nodes. Hilar assessment is limited by the lack of intravenous contrast, although the hilar contours appear unchanged. The thyroid  gland, trachea and esophagus demonstrate no significant findings.   Lungs/Pleura: No pleural effusion or pneumothorax. Moderate centrilobular emphysema. The part solid right upper lobe nodule persists and does not appear significantly changed, measuring approximately 8 x 7 mm on image 26/4. No significant change in ground-glass nodule posteriorly in the left upper lobe, measuring 8 mm on image 35/4 unchanged solid 5 mm nodule in the superior segment of the left lower lobe on image 45/4. No new or enlarging nodules are identified. There is mild dependent atelectasis in both lungs.   Upper abdomen: The visualized upper abdomen appears stable post cholecystectomy. No acute findings.   Musculoskeletal/Chest wall: There is no chest wall mass or suspicious osseous finding.   IMPRESSION: 1. Imaging for bronchoscopy planning and guidance. 2. No significant change in part solid right upper lobe nodule, ground-glass left upper lobe nodule and solid left lower lobe nodule compared with recent prior chest CT. 3. No new or enlarging nodules identified. 4. No adenopathy or pleural effusion. 5. Aortic Atherosclerosis (ICD10-I70.0) and Emphysema (ICD10-J43.9).     Electronically Signed   By: Elsie Perone M.D.   On: 06/24/2024  11:40   Treatments: Operative Report    DATE OF PROCEDURE: 09/15/2024   PREOPERATIVE DIAGNOSIS:  Adenocarcinoma of the lung, right upper lobe, clinical stage IA (T1, N0).   POSTOPERATIVE DIAGNOSIS:  Adenocarcinoma of the lung, right upper lobe, clinical stage IA (T1, N0).   PROCEDURE: 1.  Robotic-assisted right upper lobectomy. 2.  Lymph node dissection. 3.  Intercostal nerve blocks, levels 3-10.   SURGEON:  Elspeth BROCKS. Kerrin, MD  PATHOLOGY: Pending  Discharge Exam: Blood pressure (!) 90/51, pulse 100, temperature 98.7 F (37.1 C), temperature source Oral, resp. rate 18, height 5' 1.5 (1.562 m), weight 64.5 kg, SpO2 96%. General appearance: alert, cooperative, and no distress Neurologic: intact Heart: regular rate and rhythm-ST, no murmur Lungs: Slight wheezes throughout Abdomen: soft, non-tender; bowel sounds normal; no masses,  no organomegaly Extremities: extremities normal, atraumatic, no cyanosis or edema Wound: Clean and dry without sign of infection, no active bleeding from chest tube site, no stitch in place   Disposition: Discharge disposition: 01-Home or Self Care        Allergies as of 09/18/2024       Reactions   Morphine     Other Reaction(s): Other (See Comments) Makes me mean.   Citalopram Hydrobromide    Other Reaction(s): N, HA, felt strange   Estradiol  Other (See Comments)   Fezolinetant Other (See Comments)   Pantoprazole  Sodium    Other Reaction(s): Did not help        Medication List     STOP taking these medications    nitroGLYCERIN  0.4 MG SL tablet Commonly known as: NITROSTAT    potassium chloride  SA 20 MEQ tablet Commonly known as: KLOR-CON  M       TAKE these medications    acetaminophen  325 MG tablet Commonly known as: Tylenol  Take 2 tablets (650 mg total) by mouth every 6 (six) hours as needed.   albuterol  108 (90 Base) MCG/ACT inhaler Commonly known as: VENTOLIN  HFA Inhale 1-2 puffs into the lungs every 4  (four) hours as needed for wheezing or shortness of breath.   ALPRAZolam  0.5 MG tablet Commonly known as: XANAX  Take 0.5 mg by mouth at bedtime as needed for anxiety.   ARTIFICIAL TEARS OP Apply 2 drops to eye daily as needed (dry eyes).   aspirin  EC 81 MG tablet Take 81 mg by mouth daily.   BLACK COHOSH PO Take 1 capsule by mouth daily at 12 noon.   buPROPion  150 MG 12 hr tablet Commonly known as: WELLBUTRIN  SR Take 150 mg by mouth 2 (two) times daily.   estradiol 0.1 MG/GM vaginal cream Commonly known as: ESTRACE Place 1 Applicatorful vaginally 2 (two) times a week.   fluticasone  50 MCG/ACT nasal spray Commonly known as: FLONASE  Place 2 sprays into both nostrils daily as needed for allergies.   gabapentin  300 MG capsule Commonly known as: Neurontin  Take 1 capsule (300 mg total) by mouth at bedtime.   hyoscyamine  0.125 MG SL tablet Commonly known as: LEVSIN  SL Place 0.125 mg under the tongue every 4 (four) hours as needed for cramping (before meals).   lansoprazole 30 MG capsule Commonly known as: PREVACID Take 30 mg by mouth at bedtime.   loratadine  10 MG dissolvable tablet Commonly known as: CLARITIN  REDITABS Take 10 mg by mouth daily.   melatonin 3 MG Tabs tablet Take 3 mg by mouth at bedtime.   methocarbamol  500 MG tablet Commonly known as: ROBAXIN  Take 1 tablet (500 mg total) by mouth every 8 (eight) hours as needed for muscle spasms.   naproxen sodium 220 MG tablet Commonly known as: ALEVE Take 220 mg by mouth daily as needed (headache/migraine).   nicotine  21 mg/24hr patch Commonly known as: NICODERM CQ  - dosed in mg/24 hours Place 21 mg onto the skin daily.   oxyCODONE  5 MG immediate release tablet Commonly known as: Oxy IR/ROXICODONE  Take 1 tablet (5 mg total) by mouth every 6 (six) hours as needed for severe pain (pain score 7-10).   rosuvastatin  40 MG tablet Commonly known as: CRESTOR  Take 40 mg by mouth at bedtime.   triamcinolone cream  0.1 % Commonly known as: KENALOG Apply 1 Application topically daily as needed (eczema).   Vitamin B Complex Tabs Take 1 tablet by mouth daily.   Vitamin D3 125 MCG (5000 UT) Caps Take 1 capsule by mouth at bedtime.        Follow-up Information     Kerrin Elspeth BROCKS, MD Follow up on 09/30/2024.   Specialty: Cardiothoracic Surgery Why: Follow up appointment is at 11:45AM. Please get a chest xray 1 hour prior to your appointment on the 2nd floor of our building. Contact information: [address] [place] [address] [phone]         [name], MD. Schedule an appointment as soon as possible for  a visit.   Specialty: Family Medicine Why: Schedule appointment for hospital follow up within 1-2 weeks Contact information: [address] [place] [address] [phone]                 Signed: Con GORMAN Bend, PA-C  09/18/2024, 3:30 PM

## 2024-09-15 NOTE — Anesthesia Procedure Notes (Signed)
 Arterial Line Insertion Start/End12/10/2023 6:55 AM Performed by: Evetta Delon BROCKS, CRNA, CRNA  Patient location: Pre-op. Preanesthetic checklist: patient identified, IV checked and risks and benefits discussed Lidocaine  1% used for infiltration Right, radial was placed Catheter size: 20 G Hand hygiene performed   Attempts: 1 Following insertion, dressing applied and Biopatch. Post procedure assessment: unchanged  Patient tolerated the procedure well with no immediate complications.

## 2024-09-15 NOTE — Anesthesia Postprocedure Evaluation (Signed)
 Anesthesia Post Note  Patient: Audrey Hall  Procedure(s) Performed: , RIGHT UPPER LOBECTOMY ROBOT-ASSISTED, (Right: Chest) BLOCK, NERVE, INTERCOSTAL (Right: Chest) LYMPH NODE BIOPSY (Right: Chest)     Patient location during evaluation: PACU Anesthesia Type: General Level of consciousness: awake and alert Pain management: pain level controlled Vital Signs Assessment: post-procedure vital signs reviewed and stable Respiratory status: spontaneous breathing, nonlabored ventilation, respiratory function stable and patient connected to nasal cannula oxygen Cardiovascular status: blood pressure returned to baseline and stable Postop Assessment: no apparent nausea or vomiting Anesthetic complications: no   No notable events documented.  Last Vitals:  Vitals:   09/15/24 1529 09/15/24 1546  BP: 132/70 125/72  Pulse: 86 89  Resp: 18   Temp: 36.8 C   SpO2: 95% 93%    Last Pain:  Vitals:   09/15/24 1529  TempSrc: Oral  PainSc: 10-Worst pain ever                 Cordella P Excell Neyland

## 2024-09-15 NOTE — Discharge Instructions (Signed)
 Discharge Instructions:  1. You may shower, please wash incisions daily with soap and water and keep dry.  If you wish to cover wounds with dressing you may do so but please keep clean and change daily.  No tub baths or swimming until incisions have completely healed.  If your incisions become red or develop any drainage please call our office at [phone]  2. No Driving until cleared by Dr. Audree Bless office and you are no longer using narcotic pain medications  3. Monitor your weight daily.. Please use the same scale and weigh at same time... If you gain 5-10 lbs in 48 hours with associated lower extremity swelling, please contact our office at [phone]  4. Fever of 101.5 for at least 24 hours with no source, please contact our office at [phone]  5. Activity- up as tolerated, please walk at least 3 times per day.  Avoid strenuous activity, no lifting, pushing, or pulling with your arms over 8-10 lbs for a minimum of 6 weeks  6. If any questions or concerns arise, please do not hesitate to contact our office at [phone]

## 2024-09-15 NOTE — Op Note (Unsigned)
 [name], ROSSBACH MEDICAL RECORD NO: [id number] ACCOUNT NO: [id number] DATE OF BIRTH: Nov 26, 1950 FACILITY: [facility] LOCATION: [id number] PHYSICIAN: Elspeth BROCKS. Kerrin, MD  Operative Report   DATE OF PROCEDURE: 09/15/2024  PREOPERATIVE DIAGNOSIS:  Adenocarcinoma of the lung, right upper lobe, clinical stage IA (T1, N0).  POSTOPERATIVE DIAGNOSIS:  Adenocarcinoma of the lung, right upper lobe, clinical stage IA (T1, N0).  PROCEDURE: 1.  Robotic-assisted right upper lobectomy. 2.  Lymph node dissection. 3.  Intercostal nerve blocks, levels 3-10.  SURGEON:  Elspeth BROCKS. Kerrin, MD  ASSISTANT:  Con Bend, PA.   Experienced assistant was necessary for this case due to surgical complexity.  Con Bend assisted with port placement, robot docking and undocking, instrument exchange, specimen retrieval, suctioning, and wound closure.  ANESTHESIA:  General.  FINDINGS:  Normal anatomy.  Benign appearing lymph nodes.  CLINICAL NOTE:  Audrey Hall is a 73 year old woman with a history of tobacco use and lung nodules.  She recently had growth noted in a known right upper lobe nodule.  On PET CT, the nodule was mildly hypermetabolic.  A follow-up CT showed increase in size and  she underwent robotic bronchoscopy which was consistent with adenocarcinoma.  She was offered the options of surgical resection versus stereotactic radiation.  The indications, risks, benefits, and alternatives were discussed in detail with the patient.  She understood and accepted the risk of surgery and wished to proceed.  OPERATIVE NOTE:  Audrey Hall was brought to the operating room on 09/15/2024.  She had induction of general anesthesia and was intubated with a double-lumen endotracheal tube.  Intravenous antibiotics were administered.  A Foley catheter was placed.   Sequential compression devices were placed on the calves for DVT prophylaxis.  She was placed in a left lateral decubitus position.  A Bair Hugger  was placed for active warming.  The right chest was prepped and draped in the usual sterile fashion.  Single-lung ventilation of the left lung was initiated and was tolerated well throughout the procedure.  A time-out was performed.  A solution containing 20 mL of liposomal bupivacaine , 30 mL of 0.5% bupivacaine , and 50 mL of saline was prepared.  This solution was used for local at the incision sites as well as for the intercostal nerve blocks.  A time-out was performed.  An incision then was made in the eighth interspace in approximately the midaxillary line.  An 8-mm robotic port was inserted.  After confirming intrapleural placement, carbon dioxide was insufflated per protocol.  A 12-mm robotic port was placed in the eighth interspace anterior to the camera port.  Intercostal nerve blocks were performed from the third to the tenth interspace by injecting 10 mL of the bupivacaine  solution into a subpleural plane at each level.  A 12-mm AirSeal port was placed in the tenth interspace posterolaterally.  Two additional eighth interspace robotic ports were placed.  The robot was deployed.  The camera arm was docked.  Targeting was performed.  The remaining arms were docked.  The robotic instruments were inserted with thoracoscopic visualization.  There was a relatively complete major fissure.  No abnormalities of the visceral or parietal pleura.  The lung was retracted superiorly.  The inferior pulmonary ligament was divided.  A level 9 node was removed.  All lymph nodes were sent as separate  specimens for permanent pathology.  The lung was retracted anteriorly and the pleural reflection was divided at the hilum posteriorly.  Two level 7 nodes were removed.  The space at  the bifurcation of the right upper lobe bronchus from the bronchus  intermedius was explored and a level 11 node was removed.  The lung then was retracted inferiorly and the pleural reflection was divided at the hilum between the azygos vein  and the pulmonary artery branches.  A relatively large, but otherwise benign  appearing level 10 node was removed and then a similar appearing 4R node was removed.  There was minimal bleeding with the removal of these nodes, but Surgicel was packed into the area.  The lung then was retracted posteriorly and the pleural reflection was divided anteriorly.  The phrenic nerve was identified and no cautery was used in this vicinity.  Attention then was turned to the fissure.  The pleura overlying the pulmonary artery was dissected off with bipolar cautery and the fissure was completed working posteriorly.  Additional level 11 and level 12 nodes were removed.  There was a small posterior ascending branch approximately 1-2 mm in diameter, which was divided using the SynchroSeal device.  There also was a  small aberrant vein from the lower lobe into the posterior vein of the upper lobe, which was divided with the stapler using a gray cartridge.  The minor fissure then was completed with sequential firings of the robotic stapler using blue and green  cartridges working from both anterior and posterior approaches.  The upper lobe branches of the superior pulmonary vein were encircled and divided with the stapler.  The dissection then continued and the upper lobe pulmonary artery branches were  identified.  They were encircled and divided.  The stapler then was placed across the right upper lobe bronchus at its origin and closed.  A test inflation showed good aeration of the lower and middle lobes.  The stapler was fired, transecting the bronchus.  The middle lobe was tacked to the lower lobe using the stapler.  The vessel loops and sponges used during the dissection were removed.  The chest was copiously irrigated with saline.  A test inflation to 30 cm of water revealed no evidence of an air leak.  The robotic instruments were removed and the robot was undocked.  The anterior eighth interspace incision was lengthened to  3 cm.  A 15-mm Endocatch retrieval bag was placed into the chest.  The upper lobe was manipulated into the bag and removed.  The nodule was clearly palpable within the specimen.  Specimen was sent for permanent pathology.  A final inspection was made of the staple lines and the port sites for hemostasis.  A 28-French Blake drain was placed through the original port incision and directed to the apex.  It was secured with a #1 silk suture.  Dual-lung ventilation was resumed.  The incisions were closed in standard fashion.  All sponge, needle, and instrument counts were correct at the end of the procedure.  The patient was placed back in a supine position.  The chest tube was placed to a Pleur-evac on waterseal.  She was extubated in the operating room and taken to the postanesthetic care unit in good condition.  All sponge, needle, and instrument counts were correct at the end of the procedure.   PUS D: 09/15/2024 3:35:06 pm T: 09/15/2024 4:13:00 pm  JOB: [id number]/ [id number]

## 2024-09-15 NOTE — Anesthesia Procedure Notes (Signed)
 Procedure Name: Intubation Date/Time: 09/15/2024 8:06 AM  Performed by: Evetta Delon BROCKS, CRNAPre-anesthesia Checklist: Patient identified, Emergency Drugs available, Suction available and Patient being monitored Patient Re-evaluated:Patient Re-evaluated prior to induction Oxygen Delivery Method: Circle system utilized Preoxygenation: Pre-oxygenation with 100% oxygen Induction Type: IV induction Ventilation: Mask ventilation without difficulty Laryngoscope Size: Mac and 4 Grade View: Grade I Tube type: Oral Endobronchial tube: Double lumen EBT, Left and EBT position confirmed by auscultation and 35 Fr Number of attempts: 1 Airway Equipment and Method: Stylet and Oral airway Placement Confirmation: ETT inserted through vocal cords under direct vision, positive ETCO2 and breath sounds checked- equal and bilateral Secured at: 25 cm Tube secured with: Tape Dental Injury: Teeth and Oropharynx as per pre-operative assessment

## 2024-09-15 NOTE — Transfer of Care (Signed)
 Immediate Anesthesia Transfer of Care Note  Patient: Audrey Hall  Procedure(s) Performed: , RIGHT UPPER LOBECTOMY ROBOT-ASSISTED, (Right: Chest) BLOCK, NERVE, INTERCOSTAL (Right: Chest) LYMPH NODE BIOPSY (Right: Chest)  Patient Location: PACU  Anesthesia Type:General  Level of Consciousness: drowsy and patient cooperative  Airway & Oxygen Therapy: Patient Spontanous Breathing and Patient connected to face mask oxygen  Post-op Assessment: Report given to RN and Post -op Vital signs reviewed and stable  Post vital signs: Reviewed and stable  Last Vitals:  Vitals Value Taken Time  BP 152/75   Temp    Pulse 77 09/15/24 10:48  Resp 13 09/15/24 10:48  SpO2 100 % 09/15/24 10:48  Vitals shown include unfiled device data.  Last Pain:  Vitals:   09/15/24 9361  TempSrc:   PainSc: 0-No pain         Complications: No notable events documented.

## 2024-09-15 NOTE — Hospital Course (Addendum)
 HPI:  Audrey Hall is sent for consultation regarding an adenocarcinoma of the right upper lobe.   Audrey Hall is a 73 year old woman with a history of tobacco abuse, emphysema, lung nodules, anxiety, hyperlipidemia, aortic and coronary atherosclerosis, irritable bowel syndrome, tinnitus, hearing loss, vertigo, reflux, and recently diagnosed clinical stage Ia non-small cell carcinoma of the right upper lobe.   Has been involved in the low-dose CT lung cancer screening program.  In May 2025 she had a lung RADS 4B scan with growth of a right upper lobe nodule from 6.3 to 8.3 mm.  Also noted to have a nodule in the superior segment of the left lower lobe which has been stable for 2 years and a ground glass opacity in the left upper lobe which has been stable for 2 years.  She had a PET/CT which showed a 5 mm nodule with an SUV of 1.3.  This was felt to correspond to the 8 mm nodule on CT.  There is no mediastinal or hilar adenopathy.  There is also some activity along the left breast.  She had a mammogram and then a biopsy which was negative for malignancy.   She had a follow-up CT of the chest in August which showed the right upper lobe nodule increased to 9 x 9 cm.  Left lung nodules were unchanged.  She then underwent a robotic bronchoscopy by Dr. Shelah.  Needle aspirations were consistent with non-small cell carcinoma.   She was smoking 2 packs/day.  Has started Wellbutrin and has cut back a little bit to about 1.5 packs/day.  Has set a target quit date for November 14.  She has lost about 10 pounds since her diagnosis due to anxiety and poor appetite.  Had not had weight loss prior to that.  Occasional wheezing but no shortness of breath with routine activities.  She can walk up a flight of stairs without stopping.  Dr. Kerrin reviewed the patient's diagnostic studies and determined she would benefit from surgical intervention. He reviewed the patient's treatment options as well as the risks and  benefits of surgery with the patient. Audrey Hall was agreeable to proceed with surgery.  Hospital Course: Audrey Hall presented to [facility] and was brought to the operating room and underwent robotic assisted right upper lobectomy. She tolerated the procedure well and was transferred to the PACU in stable condition. She developed nausea and vomiting which resolved with reglan and phenergan . CXR on POD1 showed a small right apical space and she had an air leak. Air leak resolved on 12/03 and chest tube was removed without complication. CXR showed slightly increased right apical pneumothorax that remained stable the following day. She had right basilar atelectasis and was started on aggressive pulmonary hygiene. She had some bleeding from her chest tube site when chest tube was removed but this resolved with direct pressure. She began ambulating well on room air, 6 minute walk test showed no need for home oxygen. She was tolerating a diet and her incisions were healing well without sign of infection. She was felt stable for discharge home.

## 2024-09-15 NOTE — Brief Op Note (Addendum)
 09/15/2024  10:31 AM  PATIENT:  Audrey Hall  73 y.o. female  PRE-OPERATIVE DIAGNOSIS: Adenocarcinoma right upper lobe of lung-clinical stage Ia (T1, N0)  POST-OPERATIVE DIAGNOSIS:  Adenocarcinoma right upper lobe of lung-clinical stage Ia (T1, N0)  PROCEDURE:   ROBOTIC-ASSISTED RIGHT UPPER LOBECTOMY  BLOCK, NERVE, INTERCOSTAL (Right) LYMPH NODE DISSECTION(Right)  SURGEON:  Surgeons and Role:    * Kerrin Elspeth BROCKS, MD - Primary  PHYSICIAN ASSISTANT: Con Bend PA-C  ASSISTANTS: none   ANESTHESIA:   local and general  EBL:  50 mL   BLOOD ADMINISTERED:none  DRAINS: right pleural drain   LOCAL MEDICATIONS USED:  OTHER exparel   SPECIMEN:  Source of Specimen:  right upper lobe, lymph nodes  DISPOSITION OF SPECIMEN:  PATHOLOGY  COUNTS:  YES  TOURNIQUET:  * No tourniquets in log *  DICTATION: .done  PLAN OF CARE: Admit to inpatient   PATIENT DISPOSITION:  PACU - hemodynamically stable.   Delay start of Pharmacological VTE agent (>24hrs) due to surgical blood loss or risk of bleeding: no

## 2024-09-15 NOTE — H&P (Signed)
 PCP is [name], MD Referring Provider is Ruthell Lauraine FALCON, NP       Chief Complaint  Patient presents with   Lung Lesion      Surgical consult/ Bronch 06/24/24/Chest CT 06/24/24/ PFT's 07/11/24/ PET 03/06/24      HPI: Audrey Hall is sent for consultation regarding an adenocarcinoma of the right upper lobe.   Audrey Hall is a 73 year old woman with a history of tobacco abuse, emphysema, lung nodules, anxiety, hyperlipidemia, aortic and coronary atherosclerosis, irritable bowel syndrome, tinnitus, hearing loss, vertigo, reflux, and recently diagnosed clinical stage Ia non-small cell carcinoma of the right upper lobe.   Has been involved in the low-dose CT lung cancer screening program.  In May 2025 she had a lung RADS 4B scan with growth of a right upper lobe nodule from 6.3 to 8.3 mm.  Also noted to have a nodule in the superior segment of the left lower lobe which has been stable for 2 years and a ground glass opacity in the left upper lobe which has been stable for 2 years.  She had a PET/CT which showed a 5 mm nodule with an SUV of 1.3.  This was felt to correspond to the 8 mm nodule on CT.  There is no mediastinal or hilar adenopathy.  There is also some activity along the left breast.  She had a mammogram and then a biopsy which was negative for malignancy.   She had a follow-up CT of the chest in August which showed the right upper lobe nodule increased to 9 x 9 cm.  Left lung nodules were unchanged.  She then underwent a robotic bronchoscopy by Dr. Shelah.  Needle aspirations were consistent with non-small cell carcinoma.   She was smoking 2 packs/day.  Has started Wellbutrin  and has cut back a little bit to about 1.5 packs/day.  Has set a target quit date for November 14.  She has lost about 10 pounds since her diagnosis due to anxiety and poor appetite.  Had not had weight loss prior to that.  Occasional wheezing but no shortness of breath with routine activities.  She can walk up a  flight of stairs without stopping.   Zubrod Score: At the time of surgery this patient's most appropriate activity status/level should be described as: [x]     0    Normal activity, no symptoms []     1    Restricted in physical strenuous activity but ambulatory, able to do out light work []     2    Ambulatory and capable of self care, unable to do work activities, up and about >50 % of waking hours                              []     3    Only limited self care, in bed greater than 50% of waking hours []     4    Completely disabled, no self care, confined to bed or chair []     5    Moribund       Past Medical History:  Diagnosis Date   Abdominal bloating     Abnormal CT scan     Allergic rhinitis     Anxiety     Aortic atherosclerosis     BMI 28.0-28.9,adult     BPPV (benign paroxysmal positional vertigo), unspecified laterality 09/12/2017   Breast anomaly     COPD (chronic  obstructive pulmonary disease) (HCC)     Coronary artery disease     Full incontinence of feces     GERD without esophagitis     Grief reaction     Headache     High cholesterol     High risk medication use     Hypercholesteremia     IBS (irritable bowel syndrome)     Interstitial cystitis     Major depressive disorder, single episode, moderate (HCC)     Menopausal hot flushes     Mixed stress and urge urinary incontinence     Nephrolithiasis     Osteoporosis     Personal history of colonic polyps     PONV (postoperative nausea and vomiting)     Postmenopausal disorder     Sensorineural hearing loss (SNHL) of both ears 09/12/2017   Sleep disturbance     Smoker     Tinnitus, bilateral 09/12/2017   Vertigo 09/12/2017   Vitamin D deficiency                 Past Surgical History:  Procedure Laterality Date   BREAST BIOPSY Left 05/09/2024    US  LT BREAST BX W LOC DEV 1ST LESION IMG BX SPEC US  GUIDE 05/09/2024 [facility]   BREAST CYST ASPIRATION       BREAST EXCISIONAL BIOPSY Right      CHOLECYSTECTOMY       DILATION AND CURETTAGE OF UTERUS       EYE SURGERY        cataract   LAPAROSCOPIC APPENDECTOMY N/A 09/25/2020    Procedure: APPENDECTOMY LAPAROSCOPIC;  Surgeon: Ebbie Cough, MD;  Location: [facility];  Service: General;  Laterality: N/A;   TONSILLECTOMY       TUBAL LIGATION       VIDEO BRONCHOSCOPY WITH ENDOBRONCHIAL NAVIGATION Right 06/24/2024    Procedure: VIDEO BRONCHOSCOPY WITH ENDOBRONCHIAL NAVIGATION;  Surgeon: Shelah Lamar RAMAN, MD;  Location: [facility];  Service: Pulmonary;  Laterality: Right;  right upper lobe               Family History  Problem Relation Age of Onset   Breast cancer Mother          37s   Breast cancer Sister 70   Breast cancer Maternal Aunt            Social History Social History  Social History         Tobacco Use   Smoking status: Every Day      Types: Cigarettes   Smokeless tobacco: Never   Tobacco comments:      Started smoking at age 57. Smokes 2 packs a day KRD 07/04/2024  Vaping Use   Vaping status: Some Days  Substance Use Topics   Alcohol use: No   Drug use: No              Current Outpatient Medications  Medication Sig Dispense Refill   albuterol (VENTOLIN HFA) 108 (90 Base) MCG/ACT inhaler Inhale 1-2 puffs into the lungs every 4 (four) hours as needed for wheezing or shortness of breath.       ALPRAZolam (XANAX) 0.5 MG tablet Take 0.5-1 mg by mouth at bedtime as needed for anxiety.       aspirin EC 81 MG tablet Take 81 mg by mouth daily.       B Complex Vitamins (VITAMIN B COMPLEX) TABS Take 1 tablet by mouth daily.       Black Cohosh  160 MG CAPS 1 capsule.       buPROPion (WELLBUTRIN SR) 150 MG 12 hr tablet Take 150 mg by mouth 2 (two) times daily.       Cholecalciferol (VITAMIN D3) 5000 UNITS CAPS Take 1 capsule by mouth daily.       estradiol (ESTRACE) 0.1 MG/GM vaginal cream Place 1 Applicatorful vaginally 2 (two) times a week.       fluticasone (FLONASE) 50 MCG/ACT nasal spray Place 1 spray into  both nostrils daily.       lansoprazole (PREVACID) 30 MG capsule Take 30 mg by mouth daily.       loratadine (CLARITIN) 10 MG tablet Take 10 mg by mouth daily.       melatonin 1 MG TABS tablet Take 1 mg by mouth at bedtime.       rosuvastatin (CRESTOR) 40 MG tablet Take 40 mg by mouth daily.       triamcinolone cream (KENALOG) 0.1 % Apply 1 Application topically.       nitroGLYCERIN  (NITROSTAT ) 0.4 MG SL tablet Place 1 tablet (0.4 mg total) under the tongue every 5 (five) minutes as needed. (Patient not taking: Reported on 07/14/2024) 25 tablet 6      No current facility-administered medications for this visit.        Allergies       Allergies  Allergen Reactions   Morphine         Other Reaction(s): Other (See Comments)   Makes me mean.   Citalopram Hydrobromide        Other Reaction(s): N, HA, felt strange   Estradiol Other (See Comments)   Fezolinetant Other (See Comments)   Pantoprazole  Sodium        Other Reaction(s): Did not help        Review of Systems  Constitutional:  Positive for activity change, appetite change, fever and unexpected weight change.  HENT:  Positive for hearing loss.   Respiratory:  Positive for wheezing.   Cardiovascular:  Negative for chest pain, palpitations and leg swelling.  Gastrointestinal:  Negative for abdominal distention and abdominal pain.  Genitourinary:  Negative for difficulty urinating and dysuria.  Musculoskeletal:  Negative for arthralgias and gait problem.  Neurological:  Negative for seizures and weakness.  Hematological:  Negative for adenopathy. Does not bruise/bleed easily.  Psychiatric/Behavioral:  The patient is nervous/anxious.      BP 119/73   Pulse 80   Resp 20   Ht 5' 1 (1.549 m)   Wt 140 lb (63.5 kg)   SpO2 95% Comment: RA  BMI 26.45 kg/m  Physical Exam Vitals reviewed.  Constitutional:      General: She is not in acute distress.    Appearance: Normal appearance.  HENT:     Head: Normocephalic and  atraumatic.  Eyes:     General: No scleral icterus.    Extraocular Movements: Extraocular movements intact.  Cardiovascular:     Rate and Rhythm: Normal rate and regular rhythm.     Heart sounds: Murmur (Faint systolic murmur) heard.  Pulmonary:     Effort: Pulmonary effort is normal. No respiratory distress.     Breath sounds: Normal breath sounds. No wheezing or rales.  Abdominal:     General: There is no distension.     Palpations: Abdomen is soft.  Lymphadenopathy:     Cervical: No cervical adenopathy.  Skin:    General: Skin is warm and dry.  Neurological:     General: No focal deficit  present.     Mental Status: She is alert and oriented to person, place, and time.     Cranial Nerves: No cranial nerve deficit.     Motor: No weakness.     Diagnostic Tests: CT CHEST WITHOUT CONTRAST   TECHNIQUE: Multidetector CT imaging of the chest was performed using thin slice collimation for electromagnetic bronchoscopy planning purposes, without intravenous contrast.   RADIATION DOSE REDUCTION: This exam was performed according to the departmental dose-optimization program which includes automated exposure control, adjustment of the mA and/or kV according to patient size and/or use of iterative reconstruction technique.   COMPARISON:  Chest CT 05/26/2024 and 01/31/2024.  PET-CT 03/06/2024   FINDINGS: Cardiovascular: Atherosclerosis of the aorta, great vessels and coronary arteries. The heart size is normal. There is no pericardial effusion.   Mediastinum/Nodes: There are no enlarged mediastinal, hilar, axillary or internal mammary lymph nodes. Hilar assessment is limited by the lack of intravenous contrast, although the hilar contours appear unchanged. The thyroid  gland, trachea and esophagus demonstrate no significant findings.   Lungs/Pleura: No pleural effusion or pneumothorax. Moderate centrilobular emphysema. The part solid right upper lobe nodule persists and does  not appear significantly changed, measuring approximately 8 x 7 mm on image 26/4. No significant change in ground-glass nodule posteriorly in the left upper lobe, measuring 8 mm on image 35/4 unchanged solid 5 mm nodule in the superior segment of the left lower lobe on image 45/4. No new or enlarging nodules are identified. There is mild dependent atelectasis in both lungs.   Upper abdomen: The visualized upper abdomen appears stable post cholecystectomy. No acute findings.   Musculoskeletal/Chest wall: There is no chest wall mass or suspicious osseous finding.   IMPRESSION: 1. Imaging for bronchoscopy planning and guidance. 2. No significant change in part solid right upper lobe nodule, ground-glass left upper lobe nodule and solid left lower lobe nodule compared with recent prior chest CT. 3. No new or enlarging nodules identified. 4. No adenopathy or pleural effusion. 5. Aortic Atherosclerosis (ICD10-I70.0) and Emphysema (ICD10-J43.9).     Electronically Signed   By: Elsie Perone M.D.   On: 06/24/2024 11:40 NUCLEAR MEDICINE PET SKULL BASE TO THIGH   TECHNIQUE: 7.3 mCi F-18 FDG was injected intravenously. Full-ring PET imaging was performed from the skull base to thigh after the radiotracer. CT data was obtained and used for attenuation correction and anatomic localization.   Fasting blood glucose: 115 mg/dl   COMPARISON:  Chest CT 01/31/2024   FINDINGS: Mediastinal blood pool activity: SUV max 2.2   Liver activity: SUV max NA   NECK: Muscular activity in the left deltoid without CT correlate, likely physiologic.   Incidental CT findings: Bilateral common carotid atherosclerosis.   CHEST: A 5 mm right upper lobe nodule on image 15 of series 7 of today's PET-CT has a maximum standard uptake value of 1.3, although is below sensitive PET-CT size thresholds and accordingly indeterminate. As best I am able to tell, this is probably the lesion referred to on the  previous two lung cancer screening CT scans.   A 4 mm subpleural nodule in the superior segment left lower lobe has no perceptible associated accentuated metabolic activity but is below sensitive PET-CT size thresholds.   Mild focally accentuated activity is observed along the medial glandular tissues of the left breast, maximum SUV 3.7. This is only mildly above the glandular tissue of the contralateral breast has a maximum SUV of 2.8. Accordingly I think it is reasonable that  the patient follow her previously recommended diagnostic mammogram in July of 2025.   Incidental CT findings: Coronary, aortic arch, and branch vessel atherosclerotic vascular disease. Centrilobular emphysema. Airway thickening is present, suggesting bronchitis or reactive airways disease.   ABDOMEN/PELVIS: No significant abnormal hypermetabolic activity in this region.   Incidental CT findings: Atherosclerosis is present, including aortoiliac atherosclerotic disease. Cholecystectomy. Sigmoid colon diverticulosis.   SKELETON: No significant abnormal hypermetabolic activity in this region.   Incidental CT findings: Dextroconvex thoracolumbar scoliosis with rotary component. Degenerative disc disease and degenerative endplate findings at L3-4 and L4-5.   IMPRESSION: 1. A 5 mm right upper lobe nodule has a maximum standard uptake value of 1.3, although is below sensitive PET-CT size thresholds and accordingly indeterminate. 2. A 4 mm subpleural nodule in the superior segment left lower lobe has no perceptible associated accentuated metabolic activity but is below sensitive PET-CT size thresholds. 3. Mild focally accentuated activity is observed along the medial glandular tissues of the left breast, maximum SUV 3.7. This is only mildly above the glandular tissue of the contralateral breast has a maximum SUV of 2.8. Accordingly I think it is reasonable that the patient follow her previously recommended  diagnostic mammogram in July of 2025. 4. Airway thickening is present, suggesting bronchitis or reactive airways disease. 5. Aortic Atherosclerosis (ICD10-I70.0) and Emphysema (ICD10-J43.9).     Electronically Signed   By: Ryan Salvage M.D.   On: 03/06/2024 14:29 I personally reviewed the CT and PET/CT images.  There is a 9 mm nodule in the right upper lobe with SUV of 1.2.  No hilar or mediastinal adenopathy.  Ground glass opacity left upper lobe and nodule left lower lobe stable over serial scans with no activity on PET.  Emphysema.  Aortic atherosclerosis.  Coronary atherosclerosis.  Pulmonary function testing 07/11/2024 FVC 2.44 (89%) FEV1 1.63 (79%) FEV1 1.74 (84%) DLCO 16.15 (89%)   Impression: Audrey Hall is a 73 year old woman with a history of tobacco abuse, emphysema, lung nodules, anxiety, hyperlipidemia, aortic and coronary atherosclerosis, irritable bowel syndrome, tinnitus, hearing loss, vertigo, reflux, and recently diagnosed clinical stage Ia non-small cell carcinoma of the right upper lobe.   Non-small cell carcinoma right upper lobe-clinical T1, N0, stage Ia.  Most likely adenocarcinoma.  Treatment options include surgical resection and stereotactic radiation.  We discussed the relative advantages and disadvantages of each.  She has already met with Dr. Izell from radiation oncology.   I discussed the proposed operative procedure with Mrs. Wysocki and her husband.  I informed them of the general nature of the procedure including the need for general anesthesia, the incisions to be used, the use of the surgical robot, the impact on her pulmonary function, the use of a drainage tube postoperatively, the expected hospital stay, and the overall recovery.  I informed her of the indications, risks, benefits, and alternatives.  She understands the risks include, but not limited to death, MI, DVT, PE, bleeding, possible need for transfusion, infection, prolonged air leak,  cardiac arrhythmias, as well as possibility of other unforeseeable complications.  She understands the degree of postoperative pain is unpredictable and a small percentage of patients have chronic pain issues.   She has some emphysema on CT, but does have adequate pulmonary function to tolerate resection.   Coronary and aortic atherosclerosis-she is on a statin.  Asymptomatic.   Tobacco abuse-has started Wellbutrin.  Has set a start date 6 weeks out in mid November.  Would like her to quit smoking prior to surgery.  Did inform her that is not a problem to use nicotine substitutes if she can stop smoking cigarettes earlier we can proceed with her surgery.     Plan: Quit smoking Tentatively plan for robotic assisted right upper lobectomy on 09/15/2024.  Patient will call to schedule sooner if she can quit smoking earlier.   Elspeth JAYSON Millers, MD [facility] [phone]          Electronically signed by Millers Elspeth JAYSON, MD at 07/14/2024  4:52 PM   No interval change, no change in exam  Elspeth JAYSON. Millers, MD [facility] [phone]

## 2024-09-16 ENCOUNTER — Inpatient Hospital Stay (HOSPITAL_COMMUNITY)

## 2024-09-16 ENCOUNTER — Encounter (HOSPITAL_COMMUNITY): Payer: Self-pay | Admitting: Thoracic Surgery (Cardiothoracic Vascular Surgery)

## 2024-09-16 DIAGNOSIS — Z902 Acquired absence of lung [part of]: Secondary | ICD-10-CM

## 2024-09-16 LAB — BASIC METABOLIC PANEL WITH GFR
Anion gap: 10 (ref 5–15)
BUN: 15 mg/dL (ref 8–23)
CO2: 24 mmol/L (ref 22–32)
Calcium: 9.7 mg/dL (ref 8.9–10.3)
Chloride: 102 mmol/L (ref 98–111)
Creatinine, Ser: 1.14 mg/dL — ABNORMAL HIGH (ref 0.44–1.00)
GFR, Estimated: 51 mL/min — ABNORMAL LOW (ref >60)
Glucose, Bld: 120 mg/dL — ABNORMAL HIGH (ref 70–99)
Potassium: 3.7 mmol/L (ref 3.5–5.1)
Sodium: 136 mmol/L (ref 135–145)

## 2024-09-16 LAB — CBC
HCT: 40.6 % (ref 36.0–46.0)
Hemoglobin: 13.6 g/dL (ref 12.0–15.0)
MCH: 30.5 pg (ref 26.0–34.0)
MCHC: 33.5 g/dL (ref 30.0–36.0)
MCV: 91 fL (ref 80.0–100.0)
Platelets: 346 K/uL (ref 150–400)
RBC: 4.46 MIL/uL (ref 3.87–5.11)
RDW: 15.3 % (ref 11.5–15.5)
WBC: 19.3 K/uL — ABNORMAL HIGH (ref 4.0–10.5)
nRBC: 0 % (ref 0.0–0.2)

## 2024-09-16 LAB — SURGICAL PATHOLOGY

## 2024-09-16 MED ORDER — ONDANSETRON HCL 4 MG/2ML IJ SOLN
4.0000 mg | Freq: Four times a day (QID) | INTRAMUSCULAR | Status: DC | PRN
  Filled 2024-09-16: qty 2

## 2024-09-16 MED ORDER — POTASSIUM CHLORIDE CRYS ER 20 MEQ PO TBCR
20.0000 meq | EXTENDED_RELEASE_TABLET | Freq: Once | ORAL | Status: AC
  Administered 2024-09-16: 20 meq via ORAL
  Filled 2024-09-16: qty 1

## 2024-09-16 MED ORDER — SODIUM CHLORIDE 0.9 % IV SOLN: 12.5000 mg | Freq: Four times a day (QID) | INTRAVENOUS | Status: DC | PRN

## 2024-09-16 NOTE — Plan of Care (Signed)
  Problem: Clinical Measurements: Goal: Postoperative complications will be avoided or minimized Outcome: Progressing   

## 2024-09-16 NOTE — Progress Notes (Signed)
 Mobility Specialist Progress Note;   09/16/24 1034  Mobility  Activity Ambulated with assistance (in room)  Level of Assistance Contact guard assist, steadying assist  Assistive Device Front wheel walker  Distance Ambulated (ft) 25 ft  Activity Response Tolerated well  Mobility Referral Yes  Mobility visit 1 Mobility  Mobility Specialist Start Time (ACUTE ONLY) 1034  Mobility Specialist Stop Time (ACUTE ONLY) 1053  Mobility Specialist Time Calculation (min) (ACUTE ONLY) 19 min   Pt agreeable to mobility. Required MinG for all mobility. Requested to use BR, pt able to void. HR up to 120 bpm w/ exertion. C/o mild pain at chest tube sit. Pt returned back to sitting on EoB for comfort, left with all needs met. RN notified.   Lauraine Erm Mobility Specialist Please contact via SecureChat or [employer] [phone]

## 2024-09-16 NOTE — Progress Notes (Signed)
 Patient did not want to take any of her PO meds d/t her nausea and vomiting

## 2024-09-16 NOTE — Progress Notes (Addendum)
      [address] [employer] [address]             [phone]      1 Day Post-Op Procedure(s) (LRB): , RIGHT UPPER LOBECTOMY ROBOT-ASSISTED, (Right) BLOCK, NERVE, INTERCOSTAL (Right) LYMPH NODE BIOPSY (Right) Subjective: Patient reports she had 3 episodes of vomiting yesterday, reports she has no nausea this AM.   Objective: Vital signs in last 24 hours: Temp:  [97.1 F (36.2 C)-98.7 F (37.1 C)] 97.1 F (36.2 C) (12/02 0434) Pulse Rate:  [72-101] 97 (12/02 0434) Cardiac Rhythm: Normal sinus rhythm;Sinus tachycardia (12/01 2200) Resp:  [11-20] 20 (12/02 0434) BP: (100-152)/(61-87) 119/68 (12/02 0434) SpO2:  [91 %-95 %] 91 % (12/02 0434) Arterial Line BP: (135-168)/(59-75) 141/61 (12/01 1600)  Hemodynamic parameters for last 24 hours:    Intake/Output from previous day: 12/01 0701 - 12/02 0700 In: 1750 [I.V.:1400; IV Piggyback:350] Out: 1330 [Urine:1190; Blood:50; Chest Tube:90] Intake/Output this shift: No intake/output data recorded.  General appearance: alert, cooperative, and no distress Neurologic: intact Heart: regular rate and rhythm-sinus tachycardia, no murmur Lungs: clear to auscultation bilaterally Abdomen: soft, non-tender; bowel sounds normal; no masses,  no organomegaly Extremities: SCDs in place Wound: Clean and dry without sign of infection  Lab Results: Recent Labs    09/16/24 0510  WBC 19.3*  HGB 13.6  HCT 40.6  PLT 346   BMET:  Recent Labs    09/16/24 0510  NA 136  K 3.7  CL 102  CO2 24  GLUCOSE 120*  BUN 15  CREATININE 1.14*  CALCIUM  9.7    PT/INR: No results for input(s): LABPROT, INR in the last 72 hours. ABG    Component Value Date/Time   TCO2 25 06/24/2024 1026   CBG (last 3)  No results for input(s): GLUCAP in the last 72 hours.  Assessment/Plan: S/P Procedure(s) (LRB): , RIGHT UPPER LOBECTOMY ROBOT-ASSISTED, (Right) BLOCK, NERVE, INTERCOSTAL (Right) LYMPH NODE BIOPSY  (Right)  Neuro: Pain with movement, overall controlled. Continue current pain regimen.   CV: Stable VS. BP controlled. NSR-ST, HR 80s-low 100s.   Pulm: Saturating well on RA. CXR with a small right apical space, not seen on yesterday's postop CXR. CT output 90cc/24hrs. CT on waterseal, no air leak seen but very weak cough effort. Encourage IS and ambulation.   GI: Nausea and vomiting yesterday, resolved with phenergan . Continue Reglan, Zofran  prn and Phenergan  prn for refractory nausea and vomiting. Advance diet as able.   Renal: Cr 1.14, stable from preop. Will monitor. K 3.7, supplement. Good UO. Foley has been d/c'd.   ID: Likely reactive leukocytosis, afebrile. Will clinically monitor.   DVT Prophylaxis: Lovenox  Dispo: Possible d/c CT today, will discuss with surgeon.    LOS: 1 day    Con GORMAN Bend, PA-C 09/16/2024 Patient seen and examined, agree with above She had an air leak when I examined her so will keep CT to water seal today Small apical space on CXR Needs to improve cough  Elspeth C. Kerrin, MD [facility] [phone]

## 2024-09-16 NOTE — Progress Notes (Signed)
 Patient having multiple episodes of nausea and vomiting. Called PA for different nausea medicine. Reglan  and phenergan  ordered and given.

## 2024-09-16 NOTE — Progress Notes (Signed)
 Mobility Specialist Progress Note;   09/16/24 1100  Mobility  Activity Pivoted/transferred from bed to chair  Level of Assistance Contact guard assist, steadying assist  Assistive Device Front wheel walker  Distance Ambulated (ft) 10 ft  Activity Response Tolerated well  Mobility Referral Yes  Mobility visit 1 Mobility  Mobility Specialist Start Time (ACUTE ONLY) 1100  Mobility Specialist Stop Time (ACUTE ONLY) 1114  Mobility Specialist Time Calculation (min) (ACUTE ONLY) 14 min   Pt requesting to return to supine, however when assisting BLE back into bed pt experienced high pain. Agreeable to sit up in chair for a bit. Required MinG assistance to ambulate around the bed to chair. Pt left in chair with all needs met. RN in room.   Lauraine Erm Mobility Specialist Please contact via SecureChat or [employer] [phone]

## 2024-09-17 ENCOUNTER — Inpatient Hospital Stay (HOSPITAL_COMMUNITY)

## 2024-09-17 LAB — CBC
HCT: 37.6 % (ref 36.0–46.0)
Hemoglobin: 12.4 g/dL (ref 12.0–15.0)
MCH: 30.1 pg (ref 26.0–34.0)
MCHC: 33 g/dL (ref 30.0–36.0)
MCV: 91.3 fL (ref 80.0–100.0)
Platelets: 331 K/uL (ref 150–400)
RBC: 4.12 MIL/uL (ref 3.87–5.11)
RDW: 15.2 % (ref 11.5–15.5)
WBC: 18.2 K/uL — ABNORMAL HIGH (ref 4.0–10.5)
nRBC: 0 % (ref 0.0–0.2)

## 2024-09-17 LAB — COMPREHENSIVE METABOLIC PANEL WITH GFR
ALT: 13 U/L (ref 0–44)
AST: 27 U/L (ref 15–41)
Albumin: 3.3 g/dL — ABNORMAL LOW (ref 3.5–5.0)
Alkaline Phosphatase: 67 U/L (ref 38–126)
Anion gap: 11 (ref 5–15)
BUN: 14 mg/dL (ref 8–23)
CO2: 27 mmol/L (ref 22–32)
Calcium: 9.5 mg/dL (ref 8.9–10.3)
Chloride: 99 mmol/L (ref 98–111)
Creatinine, Ser: 0.97 mg/dL (ref 0.44–1.00)
GFR, Estimated: >60 mL/min (ref >60)
Glucose, Bld: 111 mg/dL — ABNORMAL HIGH (ref 70–99)
Potassium: 3.7 mmol/L (ref 3.5–5.1)
Sodium: 137 mmol/L (ref 135–145)
Total Bilirubin: 1 mg/dL (ref 0.0–1.2)
Total Protein: 6 g/dL — ABNORMAL LOW (ref 6.5–8.1)

## 2024-09-17 MED ORDER — POTASSIUM CHLORIDE CRYS ER 20 MEQ PO TBCR
40.0000 meq | EXTENDED_RELEASE_TABLET | Freq: Once | ORAL | Status: AC
  Administered 2024-09-17: 40 meq via ORAL
  Filled 2024-09-17: qty 2

## 2024-09-17 MED ORDER — GUAIFENESIN ER 600 MG PO TB12
600.0000 mg | ORAL_TABLET | Freq: Two times a day (BID) | ORAL | Status: DC
  Administered 2024-09-17 – 2024-09-18 (×3): 600 mg via ORAL
  Filled 2024-09-17 (×3): qty 1

## 2024-09-17 MED ORDER — METOPROLOL TARTRATE 5 MG/5ML IV SOLN: 2.5000 mg | Freq: Four times a day (QID) | INTRAVENOUS | Status: DC | PRN

## 2024-09-17 MED ORDER — METOPROLOL TARTRATE 5 MG/5ML IV SOLN: 2.5000 mg | Freq: Once | INTRAVENOUS | Status: DC

## 2024-09-17 NOTE — Progress Notes (Signed)
 Mobility Specialist Progress Note;   09/17/24 1000  Mobility  Activity Ambulated with assistance  Level of Assistance Contact guard assist, steadying assist  Assistive Device Front wheel walker  Distance Ambulated (ft) 150 ft  Activity Response Tolerated well  Mobility Referral Yes  Mobility visit 1 Mobility  Mobility Specialist Start Time (ACUTE ONLY) 1000  Mobility Specialist Stop Time (ACUTE ONLY) 1025  Mobility Specialist Time Calculation (min) (ACUTE ONLY) 25 min   Pt agreeable to ambulating more this session. On 3.5LO2 upon arrival. Required light MinG assistance for all mobility. VC used throughout ambulation as pt often running in to objects on L side of hallway. Ambulated on 3LO2, SPO2 93%>. HR up to 126 bpm w/ exertion.Attempted sitting in chair at [facility], however pt states too uncomfortable therefore transferred back to bed. Pt left in bed with all needs met, alarm on.   Lauraine Erm Mobility Specialist Please contact via SecureChat or [employer] [phone]

## 2024-09-17 NOTE — Progress Notes (Signed)
 Nurse paged me because patient with a fair amount of bleeding after right chest tube removal. I examined patient. Her vital signs are stable and she is in no acute distress. Dressing removed and she is bleeding at chest tube wound (no suture in place). Direct pressure held for over 10 minutes. Bleeding stopped. Penlight shows clot present and no further bleeding. CXR being taken now. Will follow up on CXR and monitor for any more bleeding.

## 2024-09-17 NOTE — Progress Notes (Addendum)
 [address] [employer] [place] [address]             [phone]      2 Days Post-Op Procedure(s) (LRB): , RIGHT UPPER LOBECTOMY ROBOT-ASSISTED, (Right) BLOCK, NERVE, INTERCOSTAL (Right) LYMPH NODE BIOPSY (Right) Subjective: Patient reports she is okay, does not have a lot of pain. While in the room she had difficulty bracing while coughing.   Objective: Vital signs in last 24 hours: Temp:  [97.5 F (36.4 C)-99.9 F (37.7 C)] 99 F (37.2 C) (12/03 0300) Pulse Rate:  [93-106] 93 (12/03 0300) Cardiac Rhythm: Sinus tachycardia (12/02 1902) Resp:  [15-16] 16 (12/02 2300) BP: (107-129)/(66-77) 107/66 (12/03 0300) SpO2:  [91 %-95 %] 94 % (12/03 0300)  Hemodynamic parameters for last 24 hours:    Intake/Output from previous day: 12/02 0701 - 12/03 0700 In: -  Out: 20 [Chest Tube:20] Intake/Output this shift: No intake/output data recorded.  General appearance: alert, cooperative, and no distress Neurologic: A&Ox4, overall neurologically intact with some cognitive delay Heart: sinus tachycardia, some PVCs, no murmur Lungs: diminished right basilar breath sounds Abdomen: soft, non-tender; bowel sounds normal; no masses,  no organomegaly Extremities: SCDs in place Wound: Clean and dry without sign of infection  Lab Results: Recent Labs    09/16/24 0510 09/17/24 0232  WBC 19.3* 18.2*  HGB 13.6 12.4  HCT 40.6 37.6  PLT 346 331   BMET:  Recent Labs    09/16/24 0510 09/17/24 0232  NA 136 137  K 3.7 3.7  CL 102 99  CO2 24 27  GLUCOSE 120* 111*  BUN 15 14  CREATININE 1.14* 0.97  CALCIUM  9.7 9.5    PT/INR: No results for input(s): LABPROT, INR in the last 72 hours. ABG    Component Value Date/Time   TCO2 25 06/24/2024 1026   CBG (last 3)  No results for input(s): GLUCAP in the last 72 hours.  Assessment/Plan: S/P Procedure(s) (LRB): , RIGHT UPPER LOBECTOMY ROBOT-ASSISTED, (Right) BLOCK, NERVE, INTERCOSTAL (Right) LYMPH NODE  BIOPSY (Right)  Neuro: Pain with movement, overall controlled. Continue current pain regimen. A&Ox4 but needs a lot of reminders on bracing during cough and has difficulty doing this without help.   CV: Stable VS. BP controlled. NSR-ST, HR 90s-110s. ST up to 120s with ambulation and coughing. A few PVCs. Will add IV Lopressor  PRN for tachycardia, likely due to pain. SBP low 100s.    Pulm: Saturating well on 3L [place] this AM, was on RA yesterday. CXR with stable to slightly decreased small right apical space and worsening right basilar atelectasis. CT output 20cc/24hrs. CT on waterseal, no air leak seen but still with very weak cough effort. Encourage IS and ambulation. Will add flutter valve.    GI: Nausea and vomiting resolved, ate soup for dinner last night. Will advance to regular diet. Continue Zofran  prn and Phenergan  prn for refractory nausea and vomiting.    Renal: Cr 0.97, improved from preop. Will monitor. Hypokalemic prior to admission, K 3.7, supplement.    ID: Likely reactive leukocytosis trending down, afebrile. Will clinically monitor.    DVT Prophylaxis: Lovenox  Deconditioning: Very little ambulation in the room yesterday, increase ambulation today. Will get PT/OT evals.   Dispo: Possible d/c CT today vs leave in place until she is ambulating better, will discuss with surgeon.      LOS: 2 days    Con GORMAN Bend, PA-C 09/17/2024 Patient seen and examined, agree  with above Cough better when I saw her, but still not completely clearing mucous Will add flutter valve, Mucinex No air leak- dc chest tube Path pT1a,pN0, stage IA adenocarcinoma- no need for adjuvant therapy  Tricia Oaxaca C. Kerrin, MD [facility] [phone]

## 2024-09-17 NOTE — Progress Notes (Signed)
 Chest tube removed per order, VSS patient educated on need for 1 hour of bedrest after removal of tube. Bed in lowest position with call light in reach. Safety ensured with current plan of care in progress

## 2024-09-17 NOTE — Plan of Care (Signed)
   Problem: Activity: Goal: Risk for activity intolerance will decrease Outcome: Progressing

## 2024-09-18 ENCOUNTER — Inpatient Hospital Stay (HOSPITAL_COMMUNITY)

## 2024-09-18 ENCOUNTER — Other Ambulatory Visit (HOSPITAL_COMMUNITY): Payer: Self-pay

## 2024-09-18 MED ORDER — OXYCODONE HCL 5 MG PO TABS
5.0000 mg | ORAL_TABLET | Freq: Four times a day (QID) | ORAL | 0 refills | Status: DC | PRN
  Filled 2024-09-18: qty 28, 7d supply, fill #0

## 2024-09-18 MED ORDER — ACETAMINOPHEN 325 MG PO TABS: 650.0000 mg | ORAL_TABLET | Freq: Four times a day (QID) | ORAL | Status: AC | PRN

## 2024-09-18 MED ORDER — METHOCARBAMOL 500 MG PO TABS
500.0000 mg | ORAL_TABLET | Freq: Three times a day (TID) | ORAL | 1 refills | Status: DC | PRN
  Filled 2024-09-18: qty 60, 20d supply, fill #0

## 2024-09-18 MED ORDER — GABAPENTIN 300 MG PO CAPS
300.0000 mg | ORAL_CAPSULE | Freq: Every day | ORAL | 1 refills | Status: DC
  Filled 2024-09-18: qty 30, 30d supply, fill #0

## 2024-09-18 MED ORDER — ALBUTEROL SULFATE (2.5 MG/3ML) 0.083% IN NEBU
2.5000 mg | INHALATION_SOLUTION | Freq: Once | RESPIRATORY_TRACT | Status: AC
  Administered 2024-09-18: 2.5 mg via RESPIRATORY_TRACT
  Filled 2024-09-18: qty 3

## 2024-09-18 NOTE — TOC Transition Note (Signed)
 Transition of Care (TOC) - Discharge Note Rayfield Gobble RN, BSN Inpatient Care Management Unit 4E- RN Case Manager See Treatment Team for direct phone #   Patient Details  Name: Audrey Hall MRN: [id number] Date of Birth: [birth year]  Transition of Care [facility]) CM/SW Contact:  Gobble Rayfield Hurst, RN Phone Number: 09/18/2024, 2:32 PM   Clinical Narrative:    Pt stable for transition home today, per OT note pt does not need home 02- did not require 02 on ambulation.  OT note Recommended HH f/u however per bedside RN - PT is not recommending any HH follow up. HHOT is not available as a stand alone service for [facility]- no HH orders placed for discharge.  Pt voiced she is functioning close to baseline needing min. Assist. Has RW at home if needed. Was independent prior to admit.   Family to transport home  No IP CM needs noted.    Final next level of care: Home/Self Care Barriers to Discharge: No Barriers Identified   Patient Goals and CMS Choice Patient states their goals for this hospitalization and ongoing recovery are:: return home   Choice offered to / list presented to : NA      Discharge Placement                 Home      Discharge Plan and Services Additional resources added to the After Visit Summary for     Discharge Planning Services: CM Consult Post Acute Care Choice: NA          DME Arranged: N/A DME Agency: NA       HH Arranged: NA HH Agency: NA        Social Drivers of Health (SDOH) Interventions SDOH Screenings   Food Insecurity: No Food Insecurity (09/15/2024)  Housing: Low Risk  (09/15/2024)  Transportation Needs: No Transportation Needs (09/15/2024)  Utilities: Not At Risk (09/15/2024)  Depression (PHQ2-9): Low Risk  (07/11/2024)  Social Connections: Moderately Isolated (09/15/2024)  Tobacco Use: Medium Risk (09/15/2024)     Readmission Risk Interventions    09/18/2024    2:31 PM  Readmission Risk Prevention Plan  Post Dischage Appt  Complete  Medication Screening Complete  Transportation Screening Complete

## 2024-09-18 NOTE — Progress Notes (Addendum)
      [address] [place] [address]             [phone]      3 Days Post-Op Procedure(s) (LRB): , RIGHT UPPER LOBECTOMY ROBOT-ASSISTED, (Right) BLOCK, NERVE, INTERCOSTAL (Right) LYMPH NODE BIOPSY (Right) Subjective: Patient reports she walked once in the hall yesterday and is wondering when she will be able to go home.   Objective: Vital signs in last 24 hours: Temp:  [98.3 F (36.8 C)-99 F (37.2 C)] 98.7 F (37.1 C) (12/04 0401) Pulse Rate:  [92-97] 94 (12/03 2354) Cardiac Rhythm: Sinus tachycardia (12/03 1903) Resp:  [12-20] 12 (12/04 0401) BP: (93-110)/(54-62) 103/62 (12/04 0401) SpO2:  [93 %-98 %] 95 % (12/04 0401)  Hemodynamic parameters for last 24 hours:    Intake/Output from previous day: No intake/output data recorded. Intake/Output this shift: No intake/output data recorded.  General appearance: alert, cooperative, and no distress Neurologic: intact Heart: regular rate and rhythm-ST, no murmur Lungs: Slight wheezes throughout Abdomen: soft, non-tender; bowel sounds normal; no masses,  no organomegaly Extremities: extremities normal, atraumatic, no cyanosis or edema Wound: Clean and dry without sign of infection, no active bleeding from chest tube site, no stitch in place  Lab Results: Recent Labs    09/16/24 0510 09/17/24 0232  WBC 19.3* 18.2*  HGB 13.6 12.4  HCT 40.6 37.6  PLT 346 331   BMET:  Recent Labs    09/16/24 0510 09/17/24 0232  NA 136 137  K 3.7 3.7  CL 102 99  CO2 24 27  GLUCOSE 120* 111*  BUN 15 14  CREATININE 1.14* 0.97  CALCIUM  9.7 9.5    PT/INR: No results for input(s): LABPROT, INR in the last 72 hours. ABG    Component Value Date/Time   TCO2 25 06/24/2024 1026   CBG (last 3)  No results for input(s): GLUCAP in the last 72 hours.  Assessment/Plan: S/P Procedure(s) (LRB): , RIGHT UPPER LOBECTOMY ROBOT-ASSISTED, (Right) BLOCK, NERVE, INTERCOSTAL (Right) LYMPH NODE BIOPSY  (Right)  Neuro: Pain overall controlled. Continue current pain regimen.   CV: Stable VS. BP controlled. NSR-ST, HR 90s-110s, up into the 120s with ambulation. A few PVCs. On IV Lopressor  PRN for tachycardia, likely due to pain. SBP low 90s-low 100s.    Pulm: Saturating well on RA this AM but has required 3L during ambulation, will get 6 minute walk test. CXR with stable to slightly decreased small right apical space and worsening right basilar atelectasis. CT removed yesterday. Some wheezing on exam, will get albuterol treatment this AM. Encourage IS, flutter valve, mucinex and ambulation.    GI: Nausea and vomiting resolved, tolerating diet.    Renal: Last Cr 0.97, improved from preop. Will monitor.    ID: Likely reactive leukocytosis trending down, Tmax 99. Will clinically monitor.    DVT Prophylaxis: Lovenox   Deconditioning: Ambulated once in the hall yesterday, will get 6 minute walk test today. PT/OT evals ordered but not done. Encouraged increased ambulation this AM.    Dispo: May be able to d/c home later today. Encouraged increased ambulation, may need home oxygen during ambulation? Will get 6 minute walk test.    LOS: 3 days    Con GORMAN Bend, PA-C 09/18/2024 Patient seen and examined, agree with above No desaturation with ambulation Home today  Elspeth BROCKS. Kerrin, MD [facility] [phone]

## 2024-09-18 NOTE — Evaluation (Signed)
 Physical Therapy Evaluation Patient Details Name: Audrey Hall MRN: [id number] DOB: 1951-04-12 Today's Date: 09/18/2024  History of Present Illness  73 y/o F with adenocarcinoma of R upper lung stage 1A, s/p robotic assisted R upper lobectomy on 12/1.    PMH includes COPD, GERD, high cholesterol, IBS, MDD, osteoporosis, sensorineural hearing loss, vertigo, BPPV  Clinical Impression  Pt is currently presenting at Min A for bed mobility; has adjustable bed at home. Mod I to CGA for sit to stand, gait of 300 ft with RW. Spouse is supportive and able to assist at home. Discussed discussing with PCP if pt feels she needs physical therapy after giving herself time to heal for ~ 6 weeks and continues to require RW or feels decreased strength/balance deficits, activity tolerance; pt states understanding. Currently pt is presenting at adequate level of functioning for home and no skilled physical therapy services recommended. Pt will be discharged from skilled physical therapy services at this time; please re-consult if further needs arise.            If plan is discharge home, recommend the following: Assist for transportation;Assistance with cooking/housework     Equipment Recommendations None recommended by PT     Functional Status Assessment Patient has had a recent decline in their functional status and demonstrates the ability to make significant improvements in function in a reasonable and predictable amount of time.     Precautions / Restrictions Precautions Precautions: None Restrictions Weight Bearing Restrictions Per Provider Order: No      Mobility  Bed Mobility Overal bed mobility: Needs Assistance Bed Mobility: Supine to Sit, Sit to Supine     Supine to sit: Min assist Sit to supine: Min assist   General bed mobility comments: LIght Min A to get trunk to mid line and LE to EOB due to soreness with pulling/pushing and pt concerns chest tube site may bleed.     Transfers Overall transfer level: Needs assistance Equipment used: Rolling walker (2 wheels) Transfers: Sit to/from Stand Sit to Stand: Modified independent (Device/Increase time)    Ambulation/Gait   Gait Distance (Feet): 300 Feet Assistive device: Rolling walker (2 wheels) Gait Pattern/deviations: Step-through pattern, WFL(Within Functional Limits), Decreased stride length Gait velocity: mildly decreased Gait velocity interpretation: >2.62 ft/sec, indicative of community ambulatory          Balance Overall balance assessment: No apparent balance deficits (not formally assessed) Sitting-balance support: Feet supported Sitting balance-Leahy Scale: Good     Standing balance support: Bilateral upper extremity supported, During functional activity Standing balance-Leahy Scale: Fair         Pertinent Vitals/Pain Pain Assessment Pain Assessment: No/denies pain    Home Living Family/patient expects to be discharged to:: Private residence Living Arrangements: Spouse/significant other Available Help at Discharge: Family;Available 24 hours/day Type of Home: House Home Access: Stairs to enter   [employer] of Steps: 1   Home Layout: One level Home Equipment: Agricultural Consultant (2 wheels) Additional Comments: possibly 3in1?    Prior Function Prior Level of Function : Independent/Modified Independent;Driving             Mobility Comments: ind ADLs Comments: ind with ADL and IADL, hasnt' driven in ~1 month     Extremity/Trunk Assessment   Upper Extremity Assessment Upper Extremity Assessment: Defer to OT evaluation    Lower Extremity Assessment Lower Extremity Assessment: Generalized weakness    Cervical / Trunk Assessment Cervical / Trunk Assessment: Normal  Communication   Communication Communication: No apparent  difficulties    Cognition Arousal: Alert Behavior During Therapy: WFL for tasks assessed/performed   PT - Cognitive  impairments: No apparent impairments     Following commands: Intact       Cueing Cueing Techniques: Verbal cues     General Comments General comments (skin integrity, edema, etc.): vital signs stable on room air O2 sats 97%        Assessment/Plan    PT Assessment Patient does not need any further PT services (O2 sats ambulating)         PT Goals (Current goals can be found in the Care Plan section)  Acute Rehab PT Goals PT Goal Formulation: All assessment and education complete, DC therapy     AM-PAC PT 6 Clicks Mobility  Outcome Measure Help needed turning from your back to your side while in a flat bed without using bedrails?: A Little Help needed moving from lying on your back to sitting on the side of a flat bed without using bedrails?: A Little Help needed moving to and from a bed to a chair (including a wheelchair)?: A Little Help needed standing up from a chair using your arms (e.g., wheelchair or bedside chair)?: None Help needed to walk in hospital room?: None Help needed climbing 3-5 steps with a railing? : A Little 6 Click Score: 20    End of Session Equipment Utilized During Treatment: Gait belt Activity Tolerance: Patient tolerated treatment well Patient left: in bed;with call bell/phone within reach;with family/visitor present Nurse Communication: Mobility status      Time: 1209-1228 PT Time Calculation (min) (ACUTE ONLY): 19 min   Charges:   PT Evaluation $PT Eval Low Complexity: 1 Low   PT General Charges $$ ACUTE PT VISIT: 1 Visit         Dorothyann Maier, DPT, CLT  Acute Rehabilitation Services Office: [phone] (Secure chat preferred)   Dorothyann VEAR Maier 09/18/2024, 2:33 PM

## 2024-09-18 NOTE — Progress Notes (Signed)
 SATURATION QUALIFICATIONS: (This note is used to comply with regulatory documentation for home oxygen)  Patient Saturations on Room Air at Rest = 95%  Patient Saturations on Room Air while Ambulating = 95-96%  Patient Saturations on n/a Liters of oxygen while Ambulating = n/a%  Please briefly explain why patient needs home oxygen: n/a, pt satting mid 90s on RA with activity

## 2024-09-18 NOTE — Evaluation (Signed)
 Occupational Therapy Evaluation Patient Details Name: Audrey Hall MRN: [id number] DOB: Sep 20, 1951 Today's Date: 09/18/2024   History of Present Illness   73 y/o F with adenocarcinoma of R upper lung stage 1A, s/p robotic assisted R upper lobectomy on 12/1.    PMH includes COPD, GERD, high cholesterol, IBS, MDD, osteoporosis, sensorineural hearing loss, vertigo, BPPV     Clinical Impressions Pt ind at baseline with ADL/functional mobility, lives with spouse who can assist at d/c. Pt currently performing ADLs with up to min A, min A for bed mobility and CGA for transfers with RW. Pt with slow, cautious gait pace, and mild LOB x1 with self correction, educated on energy conservation/activity pacing. Pt with SpO2 95% and above throughout session, HR up to 131bpm, and BP 99/61 (73) after hall ambulation. Pt asymptomatic. Pt presenting with impairments listed below, will follow acutely. Recommend HHOT at d/c pending progression.     If plan is discharge home, recommend the following:   A little help with bathing/dressing/bathroom;Assistance with cooking/housework     Functional Status Assessment   Patient has had a recent decline in their functional status and demonstrates the ability to make significant improvements in function in a reasonable and predictable amount of time.     Equipment Recommendations   Tub/shower seat;Other (comment) (RW)     Recommendations for Other Services   PT consult     Precautions/Restrictions   Precautions Precautions: Fall Restrictions Weight Bearing Restrictions Per Provider Order: No     Mobility Bed Mobility Overal bed mobility: Needs Assistance Bed Mobility: Supine to Sit     Supine to sit: Min assist          Transfers Overall transfer level: Needs assistance Equipment used: Rolling walker (2 wheels) Transfers: Sit to/from Stand Sit to Stand: Contact guard assist                  Balance Overall balance  assessment: Needs assistance Sitting-balance support: Feet supported Sitting balance-Leahy Scale: Good     Standing balance support: During functional activity, Reliant on assistive device for balance Standing balance-Leahy Scale: Fair                             ADL either performed or assessed with clinical judgement   ADL Overall ADL's : Needs assistance/impaired Eating/Feeding: Set up   Grooming: Wash/dry hands;Set up;Standing   Upper Body Bathing: Minimal assistance   Lower Body Bathing: Minimal assistance   Upper Body Dressing : Minimal assistance   Lower Body Dressing: Minimal assistance   Toilet Transfer: Minimal assistance   Toileting- Clothing Manipulation and Hygiene: Minimal assistance   Tub/ Shower Transfer: Minimal assistance   Functional mobility during ADLs: Contact guard assist;Rolling walker (2 wheels)       Vision   Vision Assessment?: No apparent visual deficits     Perception Perception: Not tested       Praxis Praxis: Not tested       Pertinent Vitals/Pain Pain Assessment Pain Assessment: No/denies pain     Extremity/Trunk Assessment Upper Extremity Assessment Upper Extremity Assessment: Generalized weakness   Lower Extremity Assessment Lower Extremity Assessment: Defer to PT evaluation   Cervical / Trunk Assessment Cervical / Trunk Assessment: Normal   Communication Communication Communication: No apparent difficulties   Cognition Arousal: Alert Behavior During Therapy: WFL for tasks assessed/performed Cognition: No apparent impairments  Following commands: Intact       Cueing  General Comments   Cueing Techniques: Verbal cues  VSS on RA, see note   Exercises     Shoulder Instructions      Home Living Family/patient expects to be discharged to:: Private residence Living Arrangements: Spouse/significant other Available Help at Discharge: Family;Available 24  hours/day Type of Home: House Home Access: Stairs to enter [employer] of Steps: 1   Home Layout: One level     Bathroom Shower/Tub: Producer, Television/film/video: Handicapped height     Home Equipment: Agricultural Consultant (2 wheels)   Additional Comments: possibly 3in1?      Prior Functioning/Environment Prior Level of Function : Independent/Modified Independent;Driving             Mobility Comments: ind ADLs Comments: ind with ADL and IADL, hasnt' driven in ~1 month    OT Problem List: Decreased strength;Decreased range of motion;Decreased activity tolerance;Impaired balance (sitting and/or standing)   OT Treatment/Interventions: Self-care/ADL training;Therapeutic exercise;Energy conservation;DME and/or AE instruction;Therapeutic activities;Patient/family education;Balance training      OT Goals(Current goals can be found in the care plan section)   Acute Rehab OT Goals Patient Stated Goal: none stated OT Goal Formulation: With patient Time For Goal Achievement: 10/02/24 Potential to Achieve Goals: Fair ADL Goals Pt Will Perform Upper Body Dressing: with modified independence;sitting Pt Will Perform Lower Body Dressing: with modified independence;sitting/lateral leans;sit to/from stand Pt Will Transfer to Toilet: with modified independence;ambulating;regular height toilet Pt Will Perform Tub/Shower Transfer: Shower transfer;with modified independence;ambulating;shower seat   OT Frequency:  Min 2X/week    Co-evaluation              AM-PAC OT 6 Clicks Daily Activity     Outcome Measure Help from another person eating meals?: None Help from another person taking care of personal grooming?: None Help from another person toileting, which includes using toliet, bedpan, or urinal?: A Little Help from another person bathing (including washing, rinsing, drying)?: A Little Help from another person to put on and taking off regular upper body  clothing?: A Little Help from another person to put on and taking off regular lower body clothing?: A Little 6 Click Score: 20   End of Session Equipment Utilized During Treatment: Rolling walker (2 wheels) Nurse Communication: Mobility status  Activity Tolerance: Patient tolerated treatment well Patient left: in bed;with call bell/phone within reach;with bed alarm set;with family/visitor present  OT Visit Diagnosis: Unsteadiness on feet (R26.81);Other abnormalities of gait and mobility (R26.89);Muscle weakness (generalized) (M62.81)                Time: 8944-8873 OT Time Calculation (min): 31 min Charges:  OT General Charges $OT Visit: 1 Visit OT Evaluation $OT Eval Moderate Complexity: 1 Mod OT Treatments $Self Care/Home Management : 8-22 mins  Graciemae Delisle K, OTD, OTR/L SecureChat Preferred Acute Rehab [phone]   Iram Astorino K Koonce 09/18/2024, 11:59 AM

## 2024-09-18 NOTE — Progress Notes (Signed)
 Patient received TOC meds by NT at bedside at  1458.  Volunteer at bedside for discharge.

## 2024-09-19 LAB — TYPE AND SCREEN
ABO/RH(D): O NEG
Antibody Screen: NEGATIVE
Unit division: 0
Unit division: 0

## 2024-09-19 LAB — BPAM RBC
Blood Product Expiration Date: 202512202359
Blood Product Expiration Date: 202512222359
Unit Type and Rh: 9500
Unit Type and Rh: 9500

## 2024-09-24 ENCOUNTER — Telehealth: Payer: Self-pay

## 2024-09-24 NOTE — Telephone Encounter (Signed)
 Thoracic Tumor Board Review  Aliahna Statzer was reviewed before presentation on the on the Thoracic Tumor Board on 09/25/2024.  Alaja has a personal history of lung cancer.  Alysah Carton meets [employer] (NCCN) criteria for genetic testing for Hereditary Breast and Ovarian Cancer Syndrome based on her family history of breast cancer in her mother, sister and maternal aunt.  Santana Fryer, MS, CGC  Certified Genetic Counselor  Email: [email or web address]  Phone: [phone]

## 2024-09-25 ENCOUNTER — Other Ambulatory Visit: Payer: Self-pay

## 2024-09-26 NOTE — Progress Notes (Signed)
 The proposed treatment discussed in conference is for discussion purpose only and is not a binding recommendation.  The patients have not been physically examined, or presented with their treatment options.  Therefore, final treatment plans cannot be decided.

## 2024-09-29 ENCOUNTER — Other Ambulatory Visit: Payer: Self-pay | Admitting: Thoracic Surgery (Cardiothoracic Vascular Surgery)

## 2024-09-29 DIAGNOSIS — R911 Solitary pulmonary nodule: Secondary | ICD-10-CM

## 2024-09-30 ENCOUNTER — Inpatient Hospital Stay (HOSPITAL_COMMUNITY): Admission: RE | Admit: 2024-09-30 | Discharge: 2024-09-30 | Attending: Internal Medicine | Admitting: Internal Medicine

## 2024-09-30 ENCOUNTER — Ambulatory Visit
Payer: Self-pay | Attending: Thoracic Surgery (Cardiothoracic Vascular Surgery) | Admitting: Thoracic Surgery (Cardiothoracic Vascular Surgery)

## 2024-09-30 VITALS — BP 120/74 | HR 80 | Resp 20 | Ht 61.0 in | Wt 149.0 lb

## 2024-09-30 DIAGNOSIS — R911 Solitary pulmonary nodule: Secondary | ICD-10-CM

## 2024-09-30 DIAGNOSIS — Z09 Encounter for follow-up examination after completed treatment for conditions other than malignant neoplasm: Secondary | ICD-10-CM

## 2024-09-30 DIAGNOSIS — C3411 Malignant neoplasm of upper lobe, right bronchus or lung: Secondary | ICD-10-CM

## 2024-09-30 MED ORDER — CYCLOBENZAPRINE HCL 5 MG PO TABS: 5.0000 mg | ORAL_TABLET | Freq: Three times a day (TID) | ORAL | 1 refills | Status: AC | PRN

## 2024-09-30 MED ORDER — OXYCODONE HCL 5 MG PO TABS: 5.0000 mg | ORAL_TABLET | Freq: Four times a day (QID) | ORAL | 0 refills | Status: AC | PRN

## 2024-09-30 MED ORDER — GABAPENTIN 300 MG PO CAPS: 300.0000 mg | ORAL_CAPSULE | Freq: Two times a day (BID) | ORAL | 2 refills | Status: AC

## 2024-09-30 NOTE — Progress Notes (Signed)
 [address], [place] [address]             [phone]     HPI: Audrey Hall returns for a scheduled postoperative follow-up after recent right upper lobectomy.  Audrey Hall is a 73 year old woman with a history of tobacco use, emphysema, lung nodules, anxiety, hyperlipidemia, aortic and coronary atherosclerosis, irritable bowel syndrome, tinnitus, hearing loss, vertigo, reflux, and stage Ia adenocarcinoma right upper lobe.  She underwent a robotic assisted right upper lobectomy on 09/15/2024.  Her postoperative course was uncomplicated and she went home on day 3.  Final pathology showed a stage Ia adenocarcinoma.  She has some incisional discomfort.  She is taking gabapentin  once a day.  She is taking Tylenol  4 times a day.  Using oxycodone  1-2 times a day.  Also using Robaxin  1-2 times a day.  Also complains of nausea.  She is using Phenergan  as needed for that.  Better than it was a week ago.  Past Medical History:  Diagnosis Date   Abdominal bloating    Abnormal CT scan    Allergic rhinitis    Anxiety    Aortic atherosclerosis    BMI 28.0-28.9,adult    BPPV (benign paroxysmal positional vertigo), unspecified laterality 09/12/2017   Breast anomaly    COPD (chronic obstructive pulmonary disease) (HCC)    Coronary artery disease    Full incontinence of feces    GERD without esophagitis    Grief reaction    Headache    High cholesterol    High risk medication use    Hypercholesteremia    IBS (irritable bowel syndrome)    Interstitial cystitis    Major depressive disorder, single episode, moderate (HCC)    Menopausal hot flushes    Mixed stress and urge urinary incontinence    Nephrolithiasis    Osteoporosis    Personal history of colonic polyps    PONV (postoperative nausea and vomiting)    Postmenopausal disorder    Sensorineural hearing loss (SNHL) of both ears 09/12/2017   Sleep disturbance    Smoker    Tinnitus, bilateral 09/12/2017    Vertigo 09/12/2017   Vitamin D deficiency   ,h  Current Outpatient Medications  Medication Sig Dispense Refill   acetaminophen  (TYLENOL ) 325 MG tablet Take 2 tablets (650 mg total) by mouth every 6 (six) hours as needed.     albuterol  (VENTOLIN  HFA) 108 (90 Base) MCG/ACT inhaler Inhale 1-2 puffs into the lungs every 4 (four) hours as needed for wheezing or shortness of breath.     ALPRAZolam  (XANAX ) 0.5 MG tablet Take 0.5 mg by mouth at bedtime as needed for anxiety.     aspirin  EC 81 MG tablet Take 81 mg by mouth daily.     B Complex Vitamins (VITAMIN B COMPLEX) TABS Take 1 tablet by mouth daily.     BLACK COHOSH PO Take 1 capsule by mouth daily at 12 noon.     buPROPion  (WELLBUTRIN  SR) 150 MG 12 hr tablet Take 150 mg by mouth 2 (two) times daily.     Carboxymethylcellulose Sodium (ARTIFICIAL TEARS OP) Apply 2 drops to eye daily as needed (dry eyes).     Cholecalciferol (VITAMIN D3) 5000 UNITS CAPS Take 1 capsule by mouth at bedtime.     cyclobenzaprine  (FLEXERIL ) 5 MG tablet Take 1 tablet (5 mg total) by mouth 3 (three) times daily as needed for muscle spasms. 60 tablet 1   fluticasone  (FLONASE )  50 MCG/ACT nasal spray Place 2 sprays into both nostrils daily as needed for allergies.     hyoscyamine  (LEVSIN  SL) 0.125 MG SL tablet Place 0.125 mg under the tongue every 4 (four) hours as needed for cramping (before meals).     lansoprazole (PREVACID) 30 MG capsule Take 30 mg by mouth at bedtime.     loratadine  (CLARITIN  REDITABS) 10 MG dissolvable tablet Take 10 mg by mouth daily.     melatonin 3 MG TABS tablet Take 3 mg by mouth at bedtime.     naproxen sodium (ALEVE) 220 MG tablet Take 220 mg by mouth daily as needed (headache/migraine).     nicotine  (NICODERM CQ  - DOSED IN MG/24 HOURS) 21 mg/24hr patch Place 21 mg onto the skin daily.     rosuvastatin  (CRESTOR ) 40 MG tablet Take 40 mg by mouth at bedtime.     triamcinolone cream (KENALOG) 0.1 % Apply 1 Application topically daily as needed  (eczema).     estradiol (ESTRACE) 0.1 MG/GM vaginal cream Place 1 Applicatorful vaginally 2 (two) times a week. (Patient not taking: Reported on 09/30/2024)     gabapentin  (NEURONTIN ) 300 MG capsule Take 1 capsule (300 mg total) by mouth 2 (two) times daily. 60 capsule 2   oxyCODONE  (OXY IR/ROXICODONE ) 5 MG immediate release tablet Take 1 tablet (5 mg total) by mouth every 6 (six) hours as needed for severe pain (pain score 7-10). 28 tablet 0   No current facility-administered medications for this visit.    Physical Exam BP 120/74   Pulse 80   Resp 20   Ht 5' 1 (1.549 m)   Wt 149 lb (67.6 kg)   SpO2 95%   BMI 28.51 kg/m  73 year old woman in no acute distress Alert and oriented x 3 with no focal deficits Lungs slightly diminished to right base but otherwise clear Cardiac regular rate and rhythm No peripheral edema Incisions intact  Diagnostic Tests: I personally reviewed her chest x-ray images.  Status post right upper lobectomy with small effusion.  Impression: Audrey Hall is a 73 year old woman with a history of tobacco use, emphysema, lung nodules, anxiety, hyperlipidemia, aortic and coronary atherosclerosis, irritable bowel syndrome, tinnitus, hearing loss, vertigo, reflux, and stage Ia adenocarcinoma right upper lobe.  Status post right upper lobectomy-she is about 2 weeks out from surgery.  Still has some pain which is to be expected.  She is almost out of oxycodone  so I gave her a new prescription for that.  Also running low on Robaxin .  I gave her prescription for Flexeril  5 mg 3 times daily as needed.  Will increase her gabapentin  to 300 mg twice a day.  Continue Tylenol .  Should avoid driving or heavy physical activity until her pain improves.  She will not need adjuvant therapy but will refer to Dr. Sherrod for follow-up.  Discussed possible genetic testing/counseling with her.  She is not sure if she wants to do that or not.  Will think about it and then discuss  further with Dr. Sherrod.  Plan: Referral to Dr. Sherrod Return in 4 weeks with PA and lateral chest x-ray  Audrey JAYSON Millers, MD [facility] [phone]

## 2024-10-13 ENCOUNTER — Other Ambulatory Visit: Payer: Self-pay | Admitting: Medical Oncology

## 2024-10-13 DIAGNOSIS — C3411 Malignant neoplasm of upper lobe, right bronchus or lung: Secondary | ICD-10-CM

## 2024-10-13 NOTE — Progress Notes (Signed)
 Labs ordered

## 2024-10-14 ENCOUNTER — Inpatient Hospital Stay

## 2024-10-14 ENCOUNTER — Inpatient Hospital Stay: Attending: Internal Medicine | Admitting: Internal Medicine

## 2024-10-14 VITALS — BP 121/67 | HR 83 | Temp 97.6°F | Resp 17 | Ht 61.0 in | Wt 146.7 lb

## 2024-10-14 DIAGNOSIS — C3411 Malignant neoplasm of upper lobe, right bronchus or lung: Secondary | ICD-10-CM | POA: Diagnosis not present

## 2024-10-14 DIAGNOSIS — F17290 Nicotine dependence, other tobacco product, uncomplicated: Secondary | ICD-10-CM | POA: Diagnosis not present

## 2024-10-14 DIAGNOSIS — C3490 Malignant neoplasm of unspecified part of unspecified bronchus or lung: Secondary | ICD-10-CM

## 2024-10-14 LAB — CBC WITH DIFFERENTIAL (CANCER CENTER ONLY)
Abs Immature Granulocytes: 0.03 K/uL (ref 0.00–0.07)
Basophils Absolute: 0.2 K/uL — ABNORMAL HIGH (ref 0.0–0.1)
Basophils Relative: 2 %
Eosinophils Absolute: 0.7 K/uL — ABNORMAL HIGH (ref 0.0–0.5)
Eosinophils Relative: 7 %
HCT: 38.2 % (ref 36.0–46.0)
Hemoglobin: 12.7 g/dL (ref 12.0–15.0)
Immature Granulocytes: 0 %
Lymphocytes Relative: 25 %
Lymphs Abs: 2.8 K/uL (ref 0.7–4.0)
MCH: 30.2 pg (ref 26.0–34.0)
MCHC: 33.2 g/dL (ref 30.0–36.0)
MCV: 90.7 fL (ref 80.0–100.0)
Monocytes Absolute: 0.8 K/uL (ref 0.1–1.0)
Monocytes Relative: 8 %
Neutro Abs: 6.6 K/uL (ref 1.7–7.7)
Neutrophils Relative %: 58 %
Platelet Count: 560 K/uL — ABNORMAL HIGH (ref 150–400)
RBC: 4.21 MIL/uL (ref 3.87–5.11)
RDW: 15.5 % (ref 11.5–15.5)
WBC Count: 11.3 K/uL — ABNORMAL HIGH (ref 4.0–10.5)
nRBC: 0 % (ref 0.0–0.2)

## 2024-10-14 LAB — CMP (CANCER CENTER ONLY)
ALT: 23 U/L (ref 0–44)
AST: 34 U/L (ref 15–41)
Albumin: 4.6 g/dL (ref 3.5–5.0)
Alkaline Phosphatase: 115 U/L (ref 38–126)
Anion gap: 11 (ref 5–15)
BUN: 18 mg/dL (ref 8–23)
CO2: 27 mmol/L (ref 22–32)
Calcium: 10.6 mg/dL — ABNORMAL HIGH (ref 8.9–10.3)
Chloride: 104 mmol/L (ref 98–111)
Creatinine: 0.87 mg/dL (ref 0.44–1.00)
GFR, Estimated: >60 mL/min
Glucose, Bld: 126 mg/dL — ABNORMAL HIGH (ref 70–99)
Potassium: 3.7 mmol/L (ref 3.5–5.1)
Sodium: 142 mmol/L (ref 135–145)
Total Bilirubin: 0.6 mg/dL (ref 0.0–1.2)
Total Protein: 7.7 g/dL (ref 6.5–8.1)

## 2024-10-14 NOTE — Progress Notes (Signed)
 This NN met with the pt today at his/her consult appt with Dr. Sherrod. Pt was accompanied by her husband, BJ.  NN explained the role of navigation in the cancer care continuum.  The plan for the pt is active surveillance. Pt will follow up in 6 mos with Chest CT prior.  NN provided pt with direct contact information and encouraged pt call with any questions or concerns.  NN escorted pt to scheduling to arrange for follow up appts.

## 2024-10-14 NOTE — Progress Notes (Signed)
 "   [facility] Telephone:([phone]) [phone]   Fax:([phone]  CONSULT NOTE  REFERRING PHYSICIAN: Dr. Elspeth Millers  REASON FOR CONSULTATION:  73 years old female recently diagnosed with lung cancer  HPI Audrey Hall is a 72 y.o. female.   HPI  Discussed the use of AI scribe software for clinical note transcription with the patient, who gave verbal consent to proceed.  History of Present Illness Audrey Hall is a 73 year old female with stage IA non-small cell lung adenocarcinoma status post right upper lobectomy who presents for initial post-operative oncology follow-up.  She was diagnosed with a right upper lobe pulmonary nodule initially identified on screening CT at age 55, which demonstrated slow interval growth over two years, increasing from 5.3 mm to 0.9 x 0.9 cm by August 2025. PET scan in May 2025 showed no significant uptake. Bronchoscopy with biopsy on June 24, 2024, confirmed non-small cell lung adenocarcinoma. MRI of the brain was negative for metastatic disease.  She underwent right upper lobectomy with lymph node dissection on September 15, 2024. Pathology revealed a 0.9 x 0.8 x 0.8 cm tumor with negative lymph nodes.  She describes a challenging post-operative recovery, with ongoing sharp, deep right-sided pain, sometimes radiating near the spine and esophagus, which she attributes to nerve involvement from the surgical approach. She experiences intermittent sharp pain with deep inspiration, requiring her to brace herself and avoid deep breaths. She denies significant dyspnea and is gradually increasing walking. She is not engaging in strenuous exercise.  She has had several episodes of postoperative emesis, including one significant episode in the hospital and three at home, sometimes after eating, with a sensation of food impaction and regurgitation. Phenergan  was effective in the hospital but was not available at home. She denies diarrhea. She  notes poor appetite and dysgeusia, stating food tastes bad, but her weight has remained stable. She is eating more fruit and expresses concern about her blood glucose.  She quit smoking on August 30, 2024, after approximately 55 years of tobacco use. She does not consume alcohol  or use illicit drugs. She has a history of morphine  and estradiol allergy.     Past Medical History:  Diagnosis Date   Abdominal bloating    Abnormal CT scan    Allergic rhinitis    Anxiety    Aortic atherosclerosis    BMI 28.0-28.9,adult    BPPV (benign paroxysmal positional vertigo), unspecified laterality 09/12/2017   Breast anomaly    COPD (chronic obstructive pulmonary disease) (HCC)    Coronary artery disease    Full incontinence of feces    GERD without esophagitis    Grief reaction    Headache    High cholesterol    High risk medication use    Hypercholesteremia    IBS (irritable bowel syndrome)    Interstitial cystitis    Major depressive disorder, single episode, moderate (HCC)    Menopausal hot flushes    Mixed stress and urge urinary incontinence    Nephrolithiasis    Osteoporosis    Personal history of colonic polyps    PONV (postoperative nausea and vomiting)    Postmenopausal disorder    Sensorineural hearing loss (SNHL) of both ears 09/12/2017   Sleep disturbance    Smoker    Tinnitus, bilateral 09/12/2017   Vertigo 09/12/2017   Vitamin D deficiency       Past Surgical History:  Procedure Laterality Date   BREAST BIOPSY Left 05/09/2024   US  LT BREAST  BX W LOC DEV 1ST LESION IMG BX SPEC US  GUIDE 05/09/2024 [facility] MAMMOGR[facility]   BREAST CYST ASPIRATION     BREAST EXCISIONAL BIOPSY Right    CHOLECYSTECTOMY     DILATION AND CURETTAGE OF UTERUS     EYE SURGERY     cataract   INTERCOSTAL NERVE BLOCK Right 09/15/2024   Procedure: BLOCK, NERVE, INTERCOSTAL;  Surgeon: Kerrin Elspeth BROCKS, MD;  Location: [facility] OR;  Service: Thoracic;  Laterality: Right;   LAPAROSCOPIC APPENDECTOMY  N/A 09/25/2020   Procedure: APPENDECTOMY LAPAROSCOPIC;  Surgeon: Ebbie Cough, MD;  Location: [facility];  Service: General;  Laterality: N/A;   LOBECTOMY, LUNG, ROBOT-ASSISTED, USING VATS Right 09/15/2024   Procedure: , RIGHT UPPER LOBECTOMY ROBOT-ASSISTED,;  Surgeon: Kerrin Elspeth BROCKS, MD;  Location: [facility] OR;  Service: Thoracic;  Laterality: Right;  ROBOTIC RIGHT UPPER LOBECTOMY   LYMPH NODE BIOPSY Right 09/15/2024   Procedure: LYMPH NODE BIOPSY;  Surgeon: Kerrin Elspeth BROCKS, MD;  [facility] OR;  Service: Thoracic;  Laterality: Right;   TONSILLECTOMY     TUBAL LIGATION     VIDEO BRONCHOSCOPY WITH ENDOBRONCHIAL NAVIGATION Right 06/24/2024   Procedure: VIDEO BRONCHOSCOPY WITH ENDOBRONCHIAL NAVIGATION;  Surgeon: Shelah Lamar RAMAN, MD;  Location: [facility] ENDOSCOPY;  Service: Pulmonary;  Laterality: Right;  right upper lobe    Family History  Problem Relation Age of Onset   Breast cancer Mother        7s   Breast cancer Sister 70   Breast cancer Maternal Aunt     Social History Social History[1]  Allergies[2]  Current Outpatient Medications  Medication Sig Dispense Refill   acetaminophen  (TYLENOL ) 325 MG tablet Take 2 tablets (650 mg total) by mouth every 6 (six) hours as needed.     albuterol  (VENTOLIN  HFA) 108 (90 Base) MCG/ACT inhaler Inhale 1-2 puffs into the lungs every 4 (four) hours as needed for wheezing or shortness of breath.     ALPRAZolam  (XANAX ) 0.5 MG tablet Take 0.5 mg by mouth at bedtime as needed for anxiety.     aspirin  EC 81 MG tablet Take 81 mg by mouth daily.     B Complex Vitamins (VITAMIN B COMPLEX) TABS Take 1 tablet by mouth daily.     BLACK COHOSH PO Take 1 capsule by mouth daily at 12 noon.     buPROPion  (WELLBUTRIN  SR) 150 MG 12 hr tablet Take 150 mg by mouth 2 (two) times daily.     Carboxymethylcellulose Sodium (ARTIFICIAL TEARS OP) Apply 2 drops to eye daily as needed (dry eyes).     Cholecalciferol (VITAMIN D3) 5000 UNITS CAPS Take 1 capsule by mouth  at bedtime.     cyclobenzaprine  (FLEXERIL ) 5 MG tablet Take 1 tablet (5 mg total) by mouth 3 (three) times daily as needed for muscle spasms. 60 tablet 1   estradiol (ESTRACE) 0.1 MG/GM vaginal cream Place 1 Applicatorful vaginally 2 (two) times a week. (Patient not taking: Reported on 09/30/2024)     fluticasone  (FLONASE ) 50 MCG/ACT nasal spray Place 2 sprays into both nostrils daily as needed for allergies.     gabapentin  (NEURONTIN ) 300 MG capsule Take 1 capsule (300 mg total) by mouth 2 (two) times daily. 60 capsule 2   hyoscyamine  (LEVSIN  SL) 0.125 MG SL tablet Place 0.125 mg under the tongue every 4 (four) hours as needed for cramping (before meals).     lansoprazole (PREVACID) 30 MG capsule Take 30 mg by mouth at bedtime.     loratadine  (CLARITIN  REDITABS) 10  MG dissolvable tablet Take 10 mg by mouth daily.     melatonin 3 MG TABS tablet Take 3 mg by mouth at bedtime.     naproxen sodium (ALEVE) 220 MG tablet Take 220 mg by mouth daily as needed (headache/migraine).     nicotine  (NICODERM CQ  - DOSED IN MG/24 HOURS) 21 mg/24hr patch Place 21 mg onto the skin daily.     oxyCODONE  (OXY IR/ROXICODONE ) 5 MG immediate release tablet Take 1 tablet (5 mg total) by mouth every 6 (six) hours as needed for severe pain (pain score 7-10). 28 tablet 0   rosuvastatin  (CRESTOR ) 40 MG tablet Take 40 mg by mouth at bedtime.     triamcinolone cream (KENALOG) 0.1 % Apply 1 Application topically daily as needed (eczema).     No current facility-administered medications for this visit.    Review of Systems  Constitutional: positive for fatigue Eyes: negative Ears, nose, mouth, throat, and face: negative Respiratory: positive for dyspnea on exertion and pleurisy/chest pain Cardiovascular: negative Gastrointestinal: negative Genitourinary:negative Integument/breast: negative Hematologic/lymphatic: negative Musculoskeletal:negative Neurological: negative Behavioral/Psych: negative Endocrine:  negative Allergic/Immunologic: negative  Physical Exam  MJO:jozmu, healthy, no distress, well nourished, and well developed SKIN: skin color, texture, turgor are normal, no rashes or significant lesions HEAD: Normocephalic, No masses, lesions, tenderness or abnormalities EYES: normal, PERRLA, Conjunctiva are pink and non-injected EARS: External ears normal, Canals clear OROPHARYNX:no exudate, no erythema, and lips, buccal mucosa, and tongue normal  NECK: supple, no adenopathy, no JVD LYMPH:  no palpable lymphadenopathy, no hepatosplenomegaly BREAST:not examined LUNGS: clear to auscultation , and palpation HEART: regular rate & rhythm, no murmurs, and no gallops ABDOMEN:abdomen soft, non-tender, normal bowel sounds, and no masses or organomegaly BACK: Back symmetric, no curvature., No CVA tenderness EXTREMITIES:no joint deformities, effusion, or inflammation, no edema  NEURO: alert & oriented x 3 with fluent speech, no focal motor/sensory deficits  PERFORMANCE STATUS: ECOG 1  LABORATORY DATA: Lab Results  Component Value Date   WBC 11.3 (H) 10/14/2024   HGB 12.7 10/14/2024   HCT 38.2 10/14/2024   MCV 90.7 10/14/2024   PLT 560 (H) 10/14/2024      Chemistry      Component Value Date/Time   NA 142 10/14/2024 1345   NA 141 07/17/2023 0909   K 3.7 10/14/2024 1345   CL 104 10/14/2024 1345   CO2 27 10/14/2024 1345   BUN 18 10/14/2024 1345   BUN 9 07/17/2023 0909   CREATININE 0.87 10/14/2024 1345      Component Value Date/Time   CALCIUM  10.6 (H) 10/14/2024 1345   ALKPHOS 115 10/14/2024 1345   AST 34 10/14/2024 1345   ALT 23 10/14/2024 1345   BILITOT 0.6 10/14/2024 1345       RADIOGRAPHIC STUDIES: DG Chest 2 View Result Date: 09/30/2024 EXAM: 2 VIEW(S) XRAY OF THE CHEST 09/30/2024 11:08:40 AM COMPARISON: 09/18/2024 CLINICAL HISTORY: RUL FINDINGS: LUNGS AND PLEURA: Right lobectomy. Small right pleural effusion, decreased from prior study. No Pneumothorax or focal  consolidation. HEART AND MEDIASTINUM: Aortic atherosclerotic calcification. BONES AND SOFT TISSUES: No acute osseous abnormality. IMPRESSION: 1. Status post right lobectomy. 2. Small right pleural effusion, decreased from prior study. 3. No pneumothorax or focal consolidation. Electronically signed by: Dayne Hassell MD 09/30/2024 11:33 AM EST RP Workstation: [id number]   DG Chest 2 View Result Date: 09/18/2024 CLINICAL DATA:  Status post right upper lobectomy EXAM: CHEST - 2 VIEW COMPARISON:  Chest radiograph dated 09/17/2024 FINDINGS: Right hilar surgical chain sutures. Slightly lower right  lung volume. Slightly increased hazy right lung opacity. No left focal consolidation. Similar to slightly decreased small anterior pneumothorax. Slightly increased small right pleural effusion. The heart size and mediastinal contours are within normal limits. No acute osseous abnormality. IMPRESSION: 1. Similar to slightly decreased small anterior pneumothorax. 2. Slightly increased small right pleural effusion. 3. Slightly increased hazy right lung opacity, likely atelectasis. Electronically Signed   By: Limin  Xu M.D.   On: 09/18/2024 07:19   DG Chest 1V REPEAT Same Day Result Date: 09/17/2024 EXAM: 1 VIEW(S) XRAY OF THE CHEST 09/17/2024 01:13:00 PM COMPARISON: 10/09/2024. CLINICAL HISTORY: [id number] Encounter for chest tube removal [id number] Encounter for chest tube removal FINDINGS: LINES, TUBES AND DEVICES: Interval removal of the right chest tube. LUNGS AND PLEURA: Increasing size of the right pneumothorax, now approximately 10%. Bibasilar atelectasis. No focal pulmonary opacity. No pleural effusion. HEART AND MEDIASTINUM: No acute abnormality of the cardiac and mediastinal silhouettes. BONES AND SOFT TISSUES: Postoperative changes on the right. IMPRESSION: 1. Interval removal of the right chest tube. 2. Increasing size of the right pneumothorax, now approximately 10%. 3. Bibasilar atelectasis. Electronically signed by:  Franky Crease MD 09/17/2024 05:02 PM EST RP Workstation: [id number]   DG Chest Port 1 View Result Date: 09/17/2024 CLINICAL DATA:  Status post lobectomy of lung.  Right chest tube. EXAM: PORTABLE CHEST 1 VIEW COMPARISON:  09/16/2024 FINDINGS: Right chest tube is stable with the tip at the medial right upper chest. There appears to be a persistent small right apical pneumothorax that is less conspicuous. Increased densities along the medial aspect of the right lung and findings are compatible with increased volume loss in the right lung. Left lung is clear. Negative for left pneumothorax. Heart size is stable. Postsurgical changes in the right lung. IMPRESSION: 1. Persistent small right apical pneumothorax. Right chest tube is stable. 2. New volume loss in the right lung. Electronically Signed   By: Juliene Balder M.D.   On: 09/17/2024 09:18   DG Chest Port 1 View Result Date: 09/16/2024 CLINICAL DATA:  Post right lobectomy. EXAM: PORTABLE CHEST 1 VIEW COMPARISON:  09/15/2024 FINDINGS: Lungs are hypoinflated. Right apical chest tube without significant change. Small right apical pneumothorax not well visualized on prior exam. Postsurgical change over the right mid lung and hilar region. Left lung is clear. Cardiomediastinal silhouette and remainder of the exam is unchanged. IMPRESSION: Small right apical pneumothorax with right apical chest tube in place. Postsurgical change over the right mid lung and hilar region. Electronically Signed   By: Toribio Agreste M.D.   On: 09/16/2024 08:09   DG Chest Port 1 View Result Date: 09/15/2024 CLINICAL DATA:  Status post right upper lobectomy. EXAM: PORTABLE CHEST 1 VIEW COMPARISON:  Earlier today. FINDINGS: Normal sized heart. Interval right chest tube with bilateral rib companion shadows with no definite pneumothorax. Interval right hilar and suprahilar surgical clips. The previously demonstrated right upper lobe fiducial marker is no longer seen. Remaining right lung is  clear. Interval mild left basilar atelectasis. Diffuse osteopenia. IMPRESSION: 1. Interval right upper lobectomy with no definite pneumothorax. 2. Interval mild left basilar atelectasis. Electronically Signed   By: Elspeth Bathe M.D.   On: 09/15/2024 16:06   DG Chest 2 View Result Date: 09/15/2024 EXAM: 2 VIEW(S) XRAY OF THE CHEST 09/15/2024 06:19:00 AM COMPARISON: Portable chest 06/24/2024, chest CT without contrast 06/24/2024. CLINICAL HISTORY: [id number] Pre-op chest exam [phone] Pre-op chest exam. The patient is preoperative for right upper lobectomy. FINDINGS: LUNGS AND PLEURA: The lungs  are emphysematous. There is a fiducial marker in the right upper lobe adjacent to an ill-defined small nodule. Other small nodules noted on CT are not well seen radiographically. The lungs are clear of infiltrates. No pleural effusion. No pneumothorax. HEART AND MEDIASTINUM: Calcification in the transverse aorta. No acute abnormality of the cardiac and mediastinal silhouettes. BONES AND SOFT TISSUES: Osteopenia and degenerative change of the thoracic spine. IMPRESSION: 1. Fiducial marker in the right upper lobe adjacent to an ill-defined small nodule. 2. Emphysematous lungs, clear of infiltrates. Other small bilateral pulmonary nodules on CT are not well evaluated radiographically. Electronically signed by: Francis Quam MD 09/15/2024 06:53 AM EST RP Workstation: [id number]    ASSESSMENT: This is a very pleasant 73 years old white female recently diagnosed with a stage Ia (T1a, N0, M0) non-small cell lung cancer, adenocarcinoma presented with right upper lobe lung nodule status post right upper lobectomy with lymph node dissection under the care of Dr. Kerrin on September 15, 2024 and the final pathology showed 0.9 cm adenocarcinoma.   PLAN: I had a lengthy discussion with the patient and her husband today about her current condition and treatment options.  I personally and independently reviewed the imaging study as well  as the pathology report and discussed the result with the patient and her husband.  Assessment and Plan Assessment & Plan Stage IA non-small cell lung adenocarcinoma of the right upper lobe, post-resection, in remission She remains in remission following right upper lobectomy and lymph node dissection for stage IA non-small cell lung adenocarcinoma of the right upper lobe. Pathology demonstrated a small tumor with negative lymph nodes, and there is no evidence of recurrence or metastasis. No adjuvant therapy is indicated given early stage and complete resection. She is recovering postoperatively with residual pain and intermittent emesis, which are not suggestive of malignancy recurrence. Five-year survival exceeds 80% for this stage, and recurrence risk is low. - Scheduled surveillance with follow-up visits every six months for the first two years, including CT scan and laboratory studies one week prior to each visit. - After two years, transition to annual visits with imaging until five years post-resection. - At five years, reassess for continued surveillance or discharge from oncology follow-up. - No adjuvant chemotherapy, radiation, targeted therapy, or immunotherapy indicated. - Monitor for symptoms or signs of recurrence and address new concerns as she arises. The patient was advised to call immediately if she has any other concerning symptoms in the interval.  The patient voices understanding of current disease status and treatment options and is in agreement with the current care plan.  All questions were answered. The patient knows to call the clinic with any problems, questions or concerns. We can certainly see the patient much sooner if necessary.  Thank you so much for allowing me to participate in the care of Audrey Hall. I will continue to follow up the patient with you and assist in her care. The total time spent in the appointment was 60 minutes including review of chart and  various tests results, discussions about plan of care and coordination of care plan .   Disclaimer: This note was dictated with voice recognition software. Similar sounding words can inadvertently be transcribed and may not be corrected upon review.   Sherrod MARLA Sherrod October 14, 2024, 2:24 PM        [1]  Social History Tobacco Use   Smoking status: Former    Types: Cigarettes   Smokeless tobacco: Never   Tobacco  comments:    Started smoking at age 66. Smokes 2 packs a day KRD 07/04/2024  Vaping Use   Vaping status: Some Days  Substance Use Topics   Alcohol  use: No   Drug use: No  [2]  Allergies Allergen Reactions   Morphine      Other Reaction(s): Other (See Comments)  Makes me mean.   Citalopram Hydrobromide     Other Reaction(s): N, HA, felt strange   Estradiol Other (See Comments)   Fezolinetant Other (See Comments)   Pantoprazole  Sodium     Other Reaction(s): Did not help   "

## 2024-10-20 DIAGNOSIS — C3411 Malignant neoplasm of upper lobe, right bronchus or lung: Secondary | ICD-10-CM

## 2024-10-21 ENCOUNTER — Encounter: Payer: Self-pay | Admitting: Acute Care

## 2024-10-21 ENCOUNTER — Ambulatory Visit: Admitting: Acute Care

## 2024-10-21 VITALS — BP 132/68 | HR 72 | Temp 98.2°F | Ht 61.0 in | Wt 147.5 lb

## 2024-10-21 DIAGNOSIS — G8918 Other acute postprocedural pain: Secondary | ICD-10-CM | POA: Diagnosis not present

## 2024-10-21 DIAGNOSIS — Z902 Acquired absence of lung [part of]: Secondary | ICD-10-CM

## 2024-10-21 DIAGNOSIS — G629 Polyneuropathy, unspecified: Secondary | ICD-10-CM

## 2024-10-21 DIAGNOSIS — F4329 Adjustment disorder with other symptoms: Secondary | ICD-10-CM | POA: Diagnosis not present

## 2024-10-21 DIAGNOSIS — R942 Abnormal results of pulmonary function studies: Secondary | ICD-10-CM

## 2024-10-21 DIAGNOSIS — Z87891 Personal history of nicotine dependence: Secondary | ICD-10-CM

## 2024-10-21 DIAGNOSIS — C3411 Malignant neoplasm of upper lobe, right bronchus or lung: Secondary | ICD-10-CM

## 2024-10-21 DIAGNOSIS — R9389 Abnormal findings on diagnostic imaging of other specified body structures: Secondary | ICD-10-CM

## 2024-10-21 DIAGNOSIS — J449 Chronic obstructive pulmonary disease, unspecified: Secondary | ICD-10-CM

## 2024-10-21 DIAGNOSIS — R911 Solitary pulmonary nodule: Secondary | ICD-10-CM

## 2024-10-21 DIAGNOSIS — F17211 Nicotine dependence, cigarettes, in remission: Secondary | ICD-10-CM

## 2024-10-21 NOTE — Progress Notes (Signed)
 "  History of Present Illness Audrey Hall is a 74 y.o. female former ( quit 08/2024 with a smoker followed to the lung cancer screening program.  She is followed for abnormal chest imaging by the IP Team .  Synopsis  In May 2025 patient  had a lung RADS 4B LDCT scan with growth of a right upper lobe nodule from 6.3 to 8.3 mm.  She was also noted to have a nodule in the superior segment of the left lower lobe which has been stable for 2 years and a ground glass opacity in the left upper lobe which has been stable for 2 years.  She had a PET/CT which showed a 5 mm nodule with an SUV of 1.3.  This was felt to correspond to the 8 mm nodule on CT.  There is no mediastinal or hilar adenopathy.  There is also some activity along the left breast.  She had a mammogram and then a biopsy which was negative for malignancy.   She had a follow-up CT of the chest in August which showed the right upper lobe nodule increased to 9 x 9 cm.  Left lung nodules were unchanged.  She then underwent a robotic bronchoscopy 06/24/2024.by Dr. Shelah.  Needle aspirations were consistent with non-small cell carcinoma.  Disease stage was stage Ia , she was referred to Dr. Kerrin for consideration robotic assisted right upper lobectomy on 09/15/2024. She is here today post RUL lobectomy for follow up.    10/21/2024 Discussed the use of AI scribe software for clinical note transcription with the patient, who gave verbal consent to proceed.  History of Present Illness Audrey Hall is a 74 year old female with diagnosis of non-small cell lung cancer (adenocarcinoma ) of the right upper lobe who presents for follow-up after recent robotic assisted right upper lobectomy on 09/15/2024.  She underwent right upper lobe lobectomy including lymph node evaluation opting for surgery over radiation.  Per Dr. Chrystal note her postop course was uncomplicated and she went home on day 3.  Post-surgery, she experienced nausea, vomiting, and  incisional discomfort.  She is taking gabapentin  once daily and Tylenol  4 times daily.  She does have oxycodone  prescribed however she is not taking this as it makes her feel bad, and contributes to her nausea.  She is also using Robaxin  1-2 times a day.  She has Phenergan  to use for nausea.  She reports ongoing shortness of breath, especially when walking faster than usual, and notes a decrease in her pace. She is not using her albuterol  inhaler regularly .She has a history of emphysema.  She was tearful today in the office.  I think she is processing the diagnosis of lung cancer.  She has quit smoking completely.  I have congratulated this on this wonderful achievement. She has a supportive family.  She mentions difficulty with communication and finding the right words, which she attributes to feeling 'doped up'. She wants to talk to someone about her experiences and is considering individual therapy.  She feels she would benefit from talking to someone who is experienced in managing patients with recent cancer diagnosis and treatment.  Her husband also feels that she would benefit from this.  We have provided her with several mental health options to help her process her recent healthcare scare. She prefers in person counseling however we did discuss use of online counseling until she can get set up with an in person face-to-face counselor.  She denies any  suicidal ideation, she just states she feels  sad, and needs to talk to someone.  No current nausea, vomiting, diarrhea, or loss of appetite.  She states she is eating better.  Occasional shortness of breath and difficulty with communication.  We will see her for follow-up in 3 months to ensure she is improving.  I have reminded her to increase her activity slowly and carefully and not every day.  We discussed the fact that she has had a very big surgery only a month ago, and it may take a little longer for her to start feeling like her normal  presurgical self. Overall I think she is doing amazingly well, considering a recent big surgery.  She endorses a difficult time sleeping.  She has stopped taking her muscle relaxant as well as the oxycodone .  We have encouraged her to add the muscle relaxant back with her melatonin and her daily gabapentin  to see if this will help her sleep.  We discussed limiting screen time for an hour before going to bed.  We also discussed lowering of the temperature in her room as well as white noise.  She states she will give these both a try.  I have asked her to give us  a call if she continues to have issues with sleep.  I do believe what she has counseling she may sleep better.     Test Results: Cytology 06/24/2024 A. LUNG, RUL, NEEDLE ASPIRATION  BIOPSIES:  - Malignant cells present  - Non-small cell carcinoma  - See comment   B. LUNG, RUL, BRUSHING:  - No malignant cells identified  - Macrophages    Surgical Pathology 05/09/2024    1. Breast, left, needle core biopsy, 10:00 2cmfn 8mm (coil clip) :       BENIGN BREAST SHOWING INFLAMMATORY CHANGES COMPATIBLE WITH A RUPTURED DUCT/DUCT       ECTASIA       FRAGMENT OF DUCT/CYST WALL       MICROCALCIFICATIONS PRESENT WITHIN VESSEL WALL       NEGATIVE FOR ATYPIA AND CARCINOMA   05/26/2024 CT Chest  Lobulated nodule in the peripheral right upper lobe measures 0.9 x 0.9 cm, previously 0.7 x 0.7 cm. This remains suspicious for a small lung malignancy. 2. Unchanged 0.5 cm nodule of the superior segment left lower lobe. Unchanged ground-glass nodule in the posterior left upper lobe measuring 0.6 cm. These are nonspecific and may be infectious or inflammatory. Continued attention on follow-up. 3. Emphysema and diffuse bilateral bronchial wall thickening. 4. Coronary artery disease.   PET scan 03/06/2024 A 5 mm right upper lobe nodule has a maximum standard uptake value of 1.3, although is below sensitive PET-CT size thresholds and accordingly  indeterminate. 2. A 4 mm subpleural nodule in the superior segment left lower lobe has no perceptible associated accentuated metabolic activity but is below sensitive PET-CT size thresholds. 3. Mild focally accentuated activity is observed along the medial glandular tissues of the left breast, maximum SUV 3.7. This is only mildly above the glandular tissue of the contralateral breast has a maximum SUV of 2.8. Accordingly I think it is reasonable that the patient follow her previously recommended diagnostic mammogram in July of 2025. 4. Airway thickening is present, suggesting bronchitis or reactive airways disease.     01/31/2024 LDCT Lung-RADS 4B, suspicious. 6.3 mm right upper lobe nodule of concern previously is now 8.3 mm     10/22/2023 Lung-RADS 4A, suspicious. 5.3 mm new nodule of concern in the right  upper lobe previously shows mild interval progression in the interval measuring 6.3 mm toda      Latest Ref Rng & Units 10/14/2024    1:45 PM 09/17/2024    2:32 AM 09/16/2024    5:10 AM  CBC  WBC 4.0 - 10.5 K/uL 11.3  18.2  19.3   Hemoglobin 12.0 - 15.0 g/dL 87.2  87.5  86.3   Hematocrit 36.0 - 46.0 % 38.2  37.6  40.6   Platelets 150 - 400 K/uL 560  331  346        Latest Ref Rng & Units 10/14/2024    1:45 PM 09/17/2024    2:32 AM 09/16/2024    5:10 AM  BMP  Glucose 70 - 99 mg/dL 873  888  879   BUN 8 - 23 mg/dL 18  14  15    Creatinine 0.44 - 1.00 mg/dL 9.12  9.02  8.85   Sodium 135 - 145 mmol/L 142  137  136   Potassium 3.5 - 5.1 mmol/L 3.7  3.7  3.7   Chloride 98 - 111 mmol/L 104  99  102   CO2 22 - 32 mmol/L 27  27  24    Calcium  8.9 - 10.3 mg/dL 89.3  9.5  9.7     BNP No results found for: BNP  ProBNP No results found for: PROBNP  PFT    Component Value Date/Time   FEV1PRE 1.63 07/11/2024 0938   FEV1POST 1.74 07/11/2024 0938   FVCPRE 2.44 07/11/2024 0938   FVCPOST 2.59 07/11/2024 0938   TLC 6.24 07/11/2024 0938   DLCOUNC 16.15 07/11/2024 0938    PREFEV1FVCRT 67 07/11/2024 0938   PSTFEV1FVCRT 67 07/11/2024 0938    DG Chest 2 View Result Date: 09/30/2024 EXAM: 2 VIEW(S) XRAY OF THE CHEST 09/30/2024 11:08:40 AM COMPARISON: 09/18/2024 CLINICAL HISTORY: RUL FINDINGS: LUNGS AND PLEURA: Right lobectomy. Small right pleural effusion, decreased from prior study. No Pneumothorax or focal consolidation. HEART AND MEDIASTINUM: Aortic atherosclerotic calcification. BONES AND SOFT TISSUES: No acute osseous abnormality. IMPRESSION: 1. Status post right lobectomy. 2. Small right pleural effusion, decreased from prior study. 3. No pneumothorax or focal consolidation. Electronically signed by: Katheleen Faes MD 09/30/2024 11:33 AM EST RP Workstation: [id number]     Past medical hx Past Medical History:  Diagnosis Date   Abdominal bloating    Abnormal CT scan    Allergic rhinitis    Anxiety    Aortic atherosclerosis    BMI 28.0-28.9,adult    BPPV (benign paroxysmal positional vertigo), unspecified laterality 09/12/2017   Breast anomaly    COPD (chronic obstructive pulmonary disease) (HCC)    Coronary artery disease    Full incontinence of feces    GERD without esophagitis    Grief reaction    Headache    High cholesterol    High risk medication use    Hypercholesteremia    IBS (irritable bowel syndrome)    Interstitial cystitis    Major depressive disorder, single episode, moderate (HCC)    Menopausal hot flushes    Mixed stress and urge urinary incontinence    Nephrolithiasis    Osteoporosis    Personal history of colonic polyps    PONV (postoperative nausea and vomiting)    Postmenopausal disorder    Sensorineural hearing loss (SNHL) of both ears 09/12/2017   Sleep disturbance    Smoker    Tinnitus, bilateral 09/12/2017   Vertigo 09/12/2017   Vitamin D deficiency      Social  History[1]  Ms.Thieme reports that she has quit smoking. Her smoking use included cigarettes. She has never used smokeless tobacco. She reports that she  does not drink alcohol  and does not use drugs.  Tobacco Cessation: Counseling given: Not Answered Tobacco comments: Started smoking at age 36. Smokes 2 packs a day KRD 07/04/2024 Former smoker with a 75 pack year smoking history, quit 08/2024.   Past surgical hx, Family hx, Social hx all reviewed.  Current Outpatient Medications on File Prior to Visit  Medication Sig   acetaminophen  (TYLENOL ) 325 MG tablet Take 2 tablets (650 mg total) by mouth every 6 (six) hours as needed.   albuterol  (VENTOLIN  HFA) 108 (90 Base) MCG/ACT inhaler Inhale 1-2 puffs into the lungs every 4 (four) hours as needed for wheezing or shortness of breath.   ALPRAZolam  (XANAX ) 0.5 MG tablet Take 0.5 mg by mouth at bedtime as needed for anxiety.   aspirin  EC 81 MG tablet Take 81 mg by mouth daily.   B Complex Vitamins (VITAMIN B COMPLEX) TABS Take 1 tablet by mouth daily.   buPROPion  (WELLBUTRIN  SR) 150 MG 12 hr tablet Take 150 mg by mouth 2 (two) times daily.   Carboxymethylcellulose Sodium (ARTIFICIAL TEARS OP) Apply 2 drops to eye daily as needed (dry eyes).   Cholecalciferol (VITAMIN D3) 5000 UNITS CAPS Take 1 capsule by mouth at bedtime.   cyclobenzaprine  (FLEXERIL ) 5 MG tablet Take 1 tablet (5 mg total) by mouth 3 (three) times daily as needed for muscle spasms.   estradiol (ESTRACE) 0.1 MG/GM vaginal cream Place 1 Applicatorful vaginally 2 (two) times a week.   fluticasone  (FLONASE ) 50 MCG/ACT nasal spray Place 2 sprays into both nostrils daily as needed for allergies.   gabapentin  (NEURONTIN ) 300 MG capsule Take 1 capsule (300 mg total) by mouth 2 (two) times daily.   hyoscyamine  (LEVSIN  SL) 0.125 MG SL tablet Place 0.125 mg under the tongue every 4 (four) hours as needed for cramping (before meals).   lansoprazole (PREVACID) 30 MG capsule Take 30 mg by mouth at bedtime.   loratadine  (CLARITIN  REDITABS) 10 MG dissolvable tablet Take 10 mg by mouth daily.   melatonin 3 MG TABS tablet Take 3 mg by mouth at  bedtime.   naproxen sodium (ALEVE) 220 MG tablet Take 220 mg by mouth daily as needed (headache/migraine).   nicotine  (NICODERM CQ  - DOSED IN MG/24 HOURS) 21 mg/24hr patch Place 21 mg onto the skin daily.   OVER THE COUNTER MEDICATION Takes one a day lipoflavonoid   oxyCODONE  (OXY IR/ROXICODONE ) 5 MG immediate release tablet Take 1 tablet (5 mg total) by mouth every 6 (six) hours as needed for severe pain (pain score 7-10).   rosuvastatin  (CRESTOR ) 40 MG tablet Take 40 mg by mouth at bedtime.   triamcinolone cream (KENALOG) 0.1 % Apply 1 Application topically daily as needed (eczema).   BLACK COHOSH PO Take 1 capsule by mouth daily at 12 noon. (Patient not taking: Reported on 10/21/2024)   No current facility-administered medications on file prior to visit.     Allergies[2]  Review Of Systems:  Constitutional:   No  weight loss, + night sweats,  No Fevers, chills, positive fatigue, or  lassitude.  HEENT:   No headaches,  Difficulty swallowing,  Tooth/dental problems, or  Sore throat,                No sneezing, itching, ear ache, nasal congestion, post nasal drip,   CV:  + Incisional chest pain,  No Orthopnea, PND, swelling in lower extremities, anasarca, dizziness, palpitations, syncope.   GI  No heartburn, indigestion, abdominal pain, nausea, vomiting, diarrhea, change in bowel habits, loss of appetite, bloody stools.   Resp: Intermittent  shortness of breath with exertion less at rest.  No excess mucus, no productive cough,  No non-productive cough,  No coughing up of blood.  No change in color of mucus.  No wheezing.  No chest wall deformity, slightly diminished per bases bilaterally  Skin: no rash or lesions.  GU: no dysuria, change in color of urine, no urgency or frequency.  No flank pain, no hematuria   MS:  No joint pain or swelling.  No decreased range of motion.  No back pain.  Psych:  + change in mood or affect. + depression or + anxiety.  Difficulty finding  words   Vital Signs BP 132/68   Pulse 72   Temp 98.2 F (36.8 C) (Oral)   Ht 5' 1 (1.549 m)   Wt 147 lb 8 oz (66.9 kg)   SpO2 97%   BMI 27.87 kg/m    Physical Exam: Physical Exam VITALS: SaO2- 95% on room air GENERAL: + emotional tearfulness , alert and oriented times 3. EARS NOSE THROAT: No sinus tenderness, tympanic membranes clear, pale nasal mucosa, no oral exudate, no post nasal drip, no lymphadenopathy. CHEST: No wheeze, rales, dullness, no accessory muscle use, no nasal flaring, no sternal retractions, slightly diminished per bases bilaterally. CARDIAC: S1, S2, regular rate and rhythm, no murmur. ABDOMINAL: Soft, non tender. ND, BS present. EXTREMITIES: No clubbing, cyanosis, edema. No obvious deformities. NEUROLOGICAL: Physical deconditioning postoperatively. Alert and oriented x 3, MAE x 4. SKIN: No rashes, warm and dry. No obvious skin lesions. Incisions healing, no drainage.  I personally inspected the 5 incisions post lobectomy and they are all granulating and healing no drainage. PSYCHIATRIC: Situational depression, tearfulness   Assessment/Plan Assessment & Plan Lung cancer, adenocarcinoma, status post right upper lobectomy Recently quit smoker, quit date 09/04/2024 Post-lobectomy recovery with resolved nausea and vomiting. Incisions healing well, stable oxygen saturation at 95%. - Continue follow-up with Dr. Emery in June for surveillance CT scan and evaluation. - Monitor oxygen saturation, seek medical attention if below 88%. - Encouraged gradual increase in physical activity as tolerated during recovery from right upper lobectomy.  Chronic obstructive pulmonary disease (COPD) COPD with emphysema, no current inhaler or nebulizer need, stable oxygen saturation. - Continue to monitor respiratory status, use albuterol  inhaler as needed. - Seek emergency care if oxygen saturations drop below 88% and do not rebound   Nicotine  dependence in remission,  successfully quit smoking, no nicotine  replacement therapy needed. - Continue to abstain from smoking and nicotine  products. - Patient congratulated for quitting smoking  Postoperative pain and neuropathy Postoperative pain managed with oxycodone  and muscle relaxers.  Neuropathy present, expected to improve. - Continue current pain management regimen with oxycodone  and muscle relaxers. - Monitor neuropathy symptoms, consider neurology referral if symptoms persist. - Follow-up with Dr. Kerrin as is scheduled  Adjustment disorder with mixed emotional features Adjustment disorder with irritability and communication difficulty post-surgery. Interested in therapy. Patient denies being suicidal - Explore individual therapy options through [facility] or local therapists. - Consider support groups for additional emotional support. - Multiple resources provided    I spent 45 minutes dedicated to the care of this patient on the date of this encounter to include pre-visit review of records, face-to-face time with the patient discussing conditions above, post visit ordering  of testing, clinical documentation with the electronic health record, making appropriate referrals as documented, and communicating necessary information to the patient's healthcare team.    Lauraine JULIANNA Lites, NP 10/21/2024  3:09 PM             [1]  Social History Tobacco Use   Smoking status: Former    Types: Cigarettes   Smokeless tobacco: Never   Tobacco comments:    Started smoking at age 64. Smokes 2 packs a day KRD 07/04/2024  Vaping Use   Vaping status: Some Days  Substance Use Topics   Alcohol  use: No   Drug use: No  [2]  Allergies Allergen Reactions   Morphine      Other Reaction(s): Other (See Comments)  Makes me mean.   Citalopram Hydrobromide     Other Reaction(s): N, HA, felt strange   Estradiol Other (See Comments)   Fezolinetant Other (See Comments)   Ondansetron      Other Reaction(s):  stomach upset   Pantoprazole  Sodium     Other Reaction(s): Did not help   Propofol      Other Reaction(s): nausea vomiting   "

## 2024-10-21 NOTE — Patient Instructions (Addendum)
 It is good to see you today. I am glad you have been doing well.  I am so proud of you for quitting smoking.  Lobectomy is a big surgery , but you are doing well.  Dr. Sherrod will follow your follow up lung cancer surveillance scans.  He will follow you closely every 6 months to ensure no recurrent disease. He will continue to follow up at extended intervals to ensure no recurrent disease  Continue albuterol  inhaler as needed.  I know this is hard while you are recovering . We will give you some options for counseling , someone to talk to.  We have supplied the Home based exercises to support the muscled to support breathing. Psychologytoday.com is a good resource for help with what you are processing right now. . [facility] is also a good resource for a quick appointment . It is on line, but can usually see you quickly Consider calling your work, and asking them to refer you to a psychiatrist or counselor.  Follow up with Dr. Sherrod and Kerrin as is scheduled. Use albuterol  1-2 puffs as needed for shortness of breath or wheezing.  Call if you need us  for anything. Please contact office for sooner follow up if symptoms do not improve or worsen or seek emergency care   Follow up in 3 months to ensure you are doing well.  L[facility] Cancer Resources [facility] Phone: [phone]  Website: [email or web address]/ When youre facing cancer, you need more than medical treatment. You also need care for your emotional, spiritual, social, and financial well-being.  Youll find many resources at our [employer] in [place], [place], [place], [place], and [place].  Well ask about your needs and make sure you have the right support, so you can focus on getting well. Services offered include:  - Symptom Management Clinic  - Palliative Care Clinic  - Cancer Rehabilitation  - Massage -  Nutrition Counseling  Lung Cancer support groups are offered:   Spiritual Care/Group Facilitator [phone] Patient and [address] [facility] [phone]  [facility] offers free online support groups for patients and caregivers and is led by an oncology social worker.  To learn more about [facility] support groups, please call [phone] ([phone]).

## 2024-10-27 ENCOUNTER — Other Ambulatory Visit: Payer: Self-pay | Admitting: Thoracic Surgery (Cardiothoracic Vascular Surgery)

## 2024-10-27 DIAGNOSIS — R911 Solitary pulmonary nodule: Secondary | ICD-10-CM

## 2024-10-28 ENCOUNTER — Other Ambulatory Visit: Payer: Self-pay | Admitting: Thoracic Surgery (Cardiothoracic Vascular Surgery)

## 2024-10-28 ENCOUNTER — Encounter: Payer: Self-pay | Admitting: Thoracic Surgery (Cardiothoracic Vascular Surgery)

## 2024-10-28 ENCOUNTER — Ambulatory Visit
Payer: Self-pay | Attending: Thoracic Surgery (Cardiothoracic Vascular Surgery) | Admitting: Thoracic Surgery (Cardiothoracic Vascular Surgery)

## 2024-10-28 ENCOUNTER — Ambulatory Visit (HOSPITAL_COMMUNITY)
Admission: RE | Admit: 2024-10-28 | Discharge: 2024-10-28 | Disposition: A | Discharge status: Home / Self Care | Source: Ambulatory Visit | Attending: Cardiology | Admitting: Cardiology

## 2024-10-28 VITALS — BP 124/79 | HR 82 | Resp 20 | Ht 61.0 in | Wt 149.5 lb

## 2024-10-28 DIAGNOSIS — R911 Solitary pulmonary nodule: Secondary | ICD-10-CM

## 2024-10-28 DIAGNOSIS — Z09 Encounter for follow-up examination after completed treatment for conditions other than malignant neoplasm: Secondary | ICD-10-CM

## 2024-10-28 DIAGNOSIS — C3411 Malignant neoplasm of upper lobe, right bronchus or lung: Secondary | ICD-10-CM

## 2024-10-28 NOTE — Progress Notes (Unsigned)
 "  [address], [place] [address]             [phone]     HPI:     Current Outpatient Medications  Medication Sig Dispense Refill   acetaminophen  (TYLENOL ) 325 MG tablet Take 2 tablets (650 mg total) by mouth every 6 (six) hours as needed.     albuterol  (VENTOLIN  HFA) 108 (90 Base) MCG/ACT inhaler Inhale 1-2 puffs into the lungs every 4 (four) hours as needed for wheezing or shortness of breath.     ALPRAZolam  (XANAX ) 0.5 MG tablet Take 0.5 mg by mouth at bedtime as needed for anxiety.     buPROPion  (WELLBUTRIN  SR) 150 MG 12 hr tablet Take 150 mg by mouth 2 (two) times daily.     Carboxymethylcellulose Sodium (ARTIFICIAL TEARS OP) Apply 2 drops to eye daily as needed (dry eyes).     cyclobenzaprine  (FLEXERIL ) 5 MG tablet Take 1 tablet (5 mg total) by mouth 3 (three) times daily as needed for muscle spasms. 60 tablet 1   estradiol (ESTRACE) 0.1 MG/GM vaginal cream Place 1 Applicatorful vaginally 2 (two) times a week.     fluticasone  (FLONASE ) 50 MCG/ACT nasal spray Place 2 sprays into both nostrils daily as needed for allergies.     gabapentin  (NEURONTIN ) 300 MG capsule Take 1 capsule (300 mg total) by mouth 2 (two) times daily. 60 capsule 2   hyoscyamine  (LEVSIN  SL) 0.125 MG SL tablet Place 0.125 mg under the tongue every 4 (four) hours as needed for cramping (before meals).     lansoprazole (PREVACID) 30 MG capsule Take 30 mg by mouth at bedtime.     loratadine  (CLARITIN  REDITABS) 10 MG dissolvable tablet Take 10 mg by mouth daily.     melatonin 3 MG TABS tablet Take 3 mg by mouth at bedtime.     naproxen sodium (ALEVE) 220 MG tablet Take 220 mg by mouth daily as needed (headache/migraine).     nicotine  (NICODERM CQ  - DOSED IN MG/24 HOURS) 21 mg/24hr patch Place 21 mg onto the skin daily.     OVER THE COUNTER MEDICATION Takes one a day lipoflavonoid     oxyCODONE  (OXY IR/ROXICODONE ) 5 MG immediate release tablet Take 1 tablet (5 mg total)  by mouth every 6 (six) hours as needed for severe pain (pain score 7-10). 28 tablet 0   rosuvastatin  (CRESTOR ) 40 MG tablet Take 40 mg by mouth at bedtime.     triamcinolone cream (KENALOG) 0.1 % Apply 1 Application topically daily as needed (eczema).     aspirin  EC 81 MG tablet Take 81 mg by mouth daily. (Patient not taking: Reported on 10/28/2024)     B Complex Vitamins (VITAMIN B COMPLEX) TABS Take 1 tablet by mouth daily. (Patient not taking: Reported on 10/28/2024)     BLACK COHOSH PO Take 1 capsule by mouth daily at 12 noon. (Patient not taking: Reported on 10/28/2024)     Cholecalciferol (VITAMIN D3) 5000 UNITS CAPS Take 1 capsule by mouth at bedtime. (Patient not taking: Reported on 10/28/2024)     No current facility-administered medications for this visit.    Physical Exam BP 124/79 (BP Location: Right Arm, Patient Position: Sitting, Cuff Size: Normal)   Pulse 82   Resp 20   Ht 5' 1 (1.549 m)   Wt 149 lb 8 oz (67.8 kg)   SpO2 95% Comment: RA  BMI 28.30 kg/m  74 year old woman in no acute distress  Alert and oriented x 3 with no focal deficits Lungs diminished at right base but otherwise clear Cardiac regular rate and rhythm Incisions and healing well  Diagnostic Tests: CHEST - 2 VIEW   COMPARISON:  Chest x-ray 09/30/2024, chest CT 06/24/2024   FINDINGS: Volume loss right lung compatible previous right lobectomy. Stable mild hazy density over the medial right apex. Stable scarring right midlung. Stable small amount of right pleural fluid. Minimal linear density left lateral left base likely atelectasis. Stable well-defined oval nodule opacity over the left apex when compared to 09/30/2024 although this is not seen on multiple prior chest radiographs earlier in December 2025. Surgical change over the right hilar region. Cardiomediastinal silhouette and remainder of the exam is unchanged.   IMPRESSION: 1. Stable well-defined oval nodule opacity over the left apex  when compared to 09/30/2024 although this is not seen on multiple prior chest radiographs earlier in December 2025. Recommend follow-up chest CT as this may represent a true pulmonary nodule versus artifact. 2. Stable postsurgical changes over the right lung with stable small amount of right pleural fluid. 3. Minimal linear density left lateral lung base likely atelectasis.     Electronically Signed   By: Toribio Agreste M.D.   On: 10/28/2024 13:58 Density noted on chest x-ray at site of ground glass opacity on CT from August.  Impression:  Plan: Return in 1 month with CT chest to follow-up left apical nodule  Elspeth JAYSON Millers, MD [facility] [phone]     "

## 2024-11-11 ENCOUNTER — Ambulatory Visit (HOSPITAL_COMMUNITY)
Admission: RE | Admit: 2024-11-11 | Discharge: 2024-11-11 | Disposition: A | Discharge status: Home / Self Care | Source: Ambulatory Visit

## 2024-11-11 DIAGNOSIS — R911 Solitary pulmonary nodule: Secondary | ICD-10-CM | POA: Diagnosis present

## 2024-11-11 DIAGNOSIS — Z09 Encounter for follow-up examination after completed treatment for conditions other than malignant neoplasm: Secondary | ICD-10-CM | POA: Diagnosis present

## 2024-11-26 ENCOUNTER — Other Ambulatory Visit: Payer: Self-pay

## 2024-12-01 ENCOUNTER — Inpatient Hospital Stay

## 2024-12-01 ENCOUNTER — Inpatient Hospital Stay: Admitting: Genetic Counselor

## 2024-12-02 ENCOUNTER — Ambulatory Visit: Admitting: Thoracic Surgery (Cardiothoracic Vascular Surgery)

## 2025-01-20 ENCOUNTER — Ambulatory Visit: Admitting: Acute Care

## 2025-04-06 ENCOUNTER — Inpatient Hospital Stay: Attending: Internal Medicine

## 2025-04-14 ENCOUNTER — Inpatient Hospital Stay: Admitting: Internal Medicine
